# Patient Record
Sex: Female | Born: 1935 | Race: White | Hispanic: No | Smoking: Never smoker
Health system: Southern US, Community
[De-identification: ages and names within clinical notes are randomized; demographics above are authoritative.]

## PROBLEM LIST (undated history)

## (undated) DIAGNOSIS — M7580 Other shoulder lesions, unspecified shoulder: Secondary | ICD-10-CM

## (undated) DIAGNOSIS — M1990 Unspecified osteoarthritis, unspecified site: Secondary | ICD-10-CM

## (undated) DIAGNOSIS — H3530 Unspecified macular degeneration: Secondary | ICD-10-CM

## (undated) DIAGNOSIS — Z87442 Personal history of urinary calculi: Secondary | ICD-10-CM

## (undated) DIAGNOSIS — I10 Essential (primary) hypertension: Secondary | ICD-10-CM

## (undated) DIAGNOSIS — N289 Disorder of kidney and ureter, unspecified: Secondary | ICD-10-CM

## (undated) DIAGNOSIS — R42 Dizziness and giddiness: Secondary | ICD-10-CM

## (undated) DIAGNOSIS — M109 Gout, unspecified: Secondary | ICD-10-CM

## (undated) DIAGNOSIS — H539 Unspecified visual disturbance: Secondary | ICD-10-CM

## (undated) DIAGNOSIS — I4891 Unspecified atrial fibrillation: Secondary | ICD-10-CM

## (undated) DIAGNOSIS — I499 Cardiac arrhythmia, unspecified: Secondary | ICD-10-CM

## (undated) DIAGNOSIS — N390 Urinary tract infection, site not specified: Secondary | ICD-10-CM

## (undated) HISTORY — PX: CATARACT EXTRACTION: SUR2

## (undated) HISTORY — DX: Urinary tract infection, site not specified: N39.0

## (undated) HISTORY — DX: Unspecified visual disturbance: H53.9

## (undated) HISTORY — DX: Unspecified macular degeneration: H35.30

## (undated) HISTORY — DX: Dizziness and giddiness: R42

## (undated) HISTORY — PX: PARTIAL HYSTERECTOMY: SHX80

## (undated) HISTORY — DX: Unspecified osteoarthritis, unspecified site: M19.90

---

## 1999-10-11 ENCOUNTER — Other Ambulatory Visit: Admission: RE | Admit: 1999-10-11 | Discharge: 1999-10-11 | Payer: Self-pay | Admitting: *Deleted

## 1999-10-24 ENCOUNTER — Encounter: Payer: Self-pay | Admitting: Family Medicine

## 1999-10-24 ENCOUNTER — Encounter: Admission: RE | Admit: 1999-10-24 | Discharge: 1999-10-24 | Payer: Self-pay | Admitting: Family Medicine

## 1999-10-26 ENCOUNTER — Other Ambulatory Visit: Admission: RE | Admit: 1999-10-26 | Discharge: 1999-10-26 | Payer: Self-pay | Admitting: *Deleted

## 1999-11-05 ENCOUNTER — Encounter: Admission: RE | Admit: 1999-11-05 | Discharge: 1999-11-05 | Payer: Self-pay | Admitting: *Deleted

## 1999-11-05 ENCOUNTER — Encounter: Payer: Self-pay | Admitting: *Deleted

## 1999-11-21 ENCOUNTER — Inpatient Hospital Stay (HOSPITAL_COMMUNITY): Admission: RE | Admit: 1999-11-21 | Discharge: 1999-11-23 | Payer: Self-pay | Admitting: *Deleted

## 1999-11-21 ENCOUNTER — Encounter (INDEPENDENT_AMBULATORY_CARE_PROVIDER_SITE_OTHER): Payer: Self-pay

## 2000-01-14 ENCOUNTER — Encounter: Admission: RE | Admit: 2000-01-14 | Discharge: 2000-01-14 | Payer: Self-pay | Admitting: *Deleted

## 2000-01-14 ENCOUNTER — Encounter: Payer: Self-pay | Admitting: *Deleted

## 2000-10-10 ENCOUNTER — Other Ambulatory Visit: Admission: RE | Admit: 2000-10-10 | Discharge: 2000-10-10 | Payer: Self-pay | Admitting: *Deleted

## 2000-11-11 ENCOUNTER — Encounter: Admission: RE | Admit: 2000-11-11 | Discharge: 2000-11-11 | Payer: Self-pay | Admitting: *Deleted

## 2000-11-11 ENCOUNTER — Encounter: Payer: Self-pay | Admitting: *Deleted

## 2001-11-04 ENCOUNTER — Other Ambulatory Visit: Admission: RE | Admit: 2001-11-04 | Discharge: 2001-11-04 | Payer: Self-pay | Admitting: *Deleted

## 2001-11-11 ENCOUNTER — Encounter: Payer: Self-pay | Admitting: *Deleted

## 2001-11-11 ENCOUNTER — Encounter: Admission: RE | Admit: 2001-11-11 | Discharge: 2001-11-11 | Payer: Self-pay | Admitting: *Deleted

## 2001-11-13 ENCOUNTER — Ambulatory Visit (HOSPITAL_COMMUNITY): Admission: RE | Admit: 2001-11-13 | Discharge: 2001-11-13 | Payer: Self-pay | Admitting: Ophthalmology

## 2002-12-09 ENCOUNTER — Encounter: Admission: RE | Admit: 2002-12-09 | Discharge: 2002-12-09 | Payer: Self-pay | Admitting: Family Medicine

## 2002-12-09 ENCOUNTER — Encounter: Payer: Self-pay | Admitting: Family Medicine

## 2002-12-28 ENCOUNTER — Encounter: Payer: Self-pay | Admitting: Family Medicine

## 2002-12-28 ENCOUNTER — Encounter: Admission: RE | Admit: 2002-12-28 | Discharge: 2002-12-28 | Payer: Self-pay | Admitting: Family Medicine

## 2003-12-15 ENCOUNTER — Encounter: Admission: RE | Admit: 2003-12-15 | Discharge: 2003-12-15 | Payer: Self-pay | Admitting: Family Medicine

## 2004-12-28 ENCOUNTER — Encounter: Admission: RE | Admit: 2004-12-28 | Discharge: 2004-12-28 | Payer: Self-pay | Admitting: Family Medicine

## 2005-12-31 ENCOUNTER — Encounter: Admission: RE | Admit: 2005-12-31 | Discharge: 2005-12-31 | Payer: Self-pay | Admitting: Family Medicine

## 2006-01-29 ENCOUNTER — Encounter: Admission: RE | Admit: 2006-01-29 | Discharge: 2006-01-29 | Payer: Self-pay | Admitting: Family Medicine

## 2006-04-08 ENCOUNTER — Encounter: Admission: RE | Admit: 2006-04-08 | Discharge: 2006-04-08 | Payer: Self-pay | Admitting: Family Medicine

## 2007-01-15 ENCOUNTER — Encounter: Admission: RE | Admit: 2007-01-15 | Discharge: 2007-01-15 | Payer: Self-pay | Admitting: Family Medicine

## 2008-02-04 ENCOUNTER — Encounter: Admission: RE | Admit: 2008-02-04 | Discharge: 2008-02-04 | Payer: Self-pay | Admitting: Family Medicine

## 2009-02-06 ENCOUNTER — Encounter: Admission: RE | Admit: 2009-02-06 | Discharge: 2009-02-06 | Payer: Self-pay | Admitting: Family Medicine

## 2009-10-27 ENCOUNTER — Observation Stay (HOSPITAL_COMMUNITY): Admission: EM | Admit: 2009-10-27 | Discharge: 2009-10-27 | Payer: Self-pay | Admitting: Emergency Medicine

## 2010-03-09 ENCOUNTER — Encounter: Admission: RE | Admit: 2010-03-09 | Discharge: 2010-03-09 | Payer: Self-pay | Admitting: Family Medicine

## 2010-11-23 LAB — BASIC METABOLIC PANEL WITH GFR
BUN: 14 mg/dL (ref 6–23)
CO2: 28 meq/L (ref 19–32)
Chloride: 101 meq/L (ref 96–112)
Creatinine, Ser: 0.82 mg/dL (ref 0.4–1.2)
GFR calc non Af Amer: >60 mL/min (ref >60)
Glucose, Bld: 198 mg/dL — ABNORMAL HIGH (ref 70–99)

## 2010-11-23 LAB — DIFFERENTIAL
Basophils Absolute: 0 10*3/uL (ref 0.0–0.1)
Basophils Relative: 0 % (ref 0–1)
Eosinophils Absolute: 0 10*3/uL (ref 0.0–0.7)
Eosinophils Relative: 0 % (ref 0–5)
Lymphocytes Relative: 7 % — ABNORMAL LOW (ref 12–46)
Lymphs Abs: 0.8 K/uL (ref 0.7–4.0)
Monocytes Absolute: 0.6 K/uL (ref 0.1–1.0)
Monocytes Relative: 4 % (ref 3–12)
Neutro Abs: 11.2 K/uL — ABNORMAL HIGH (ref 1.7–7.7)
Neutrophils Relative %: 89 % — ABNORMAL HIGH (ref 43–77)

## 2010-11-23 LAB — URINE CULTURE: Colony Count: >=100000

## 2010-11-23 LAB — URINE MICROSCOPIC-ADD ON

## 2010-11-23 LAB — URINALYSIS, ROUTINE W REFLEX MICROSCOPIC
Bilirubin Urine: NEGATIVE
Glucose, UA: 250 mg/dL — AB
Ketones, ur: 15 mg/dL — AB
Nitrite: POSITIVE — AB
Protein, ur: NEGATIVE mg/dL
Specific Gravity, Urine: 1.021 (ref 1.005–1.030)
Urobilinogen, UA: 1 mg/dL (ref 0.0–1.0)
pH: 6.5 (ref 5.0–8.0)

## 2010-11-23 LAB — CBC
HCT: 40 % (ref 36.0–46.0)
Hemoglobin: 13.6 g/dL (ref 12.0–15.0)
MCHC: 34.1 g/dL (ref 30.0–36.0)
MCV: 95 fL (ref 78.0–100.0)
Platelets: 239 10*3/uL (ref 150–400)
RBC: 4.21 MIL/uL (ref 3.87–5.11)
RDW: 13.7 % (ref 11.5–15.5)
WBC: 12.6 K/uL — ABNORMAL HIGH (ref 4.0–10.5)

## 2010-11-23 LAB — BASIC METABOLIC PANEL
Calcium: 8.9 mg/dL (ref 8.4–10.5)
GFR calc Af Amer: >60 mL/min (ref >60)
Potassium: 3.3 mEq/L — ABNORMAL LOW (ref 3.5–5.1)
Sodium: 134 mEq/L — ABNORMAL LOW (ref 135–145)

## 2011-01-18 NOTE — H&P (Signed)
[place]  Patient:    Nancy Hayes, Nancy Hayes Visit Number: [id number] MRN: [id number]          Service Type: DSU Location: [facility] 2899 [id number] Attending Physician:  Ivor Messier Dictated by:   Guadelupe Sabin, M.D. Admit Date:  11/13/2001 Discharge Date: 11/13/2001   CC:         Kristian Covey, M.D.   History and Physical  REASON FOR ADMISSION:  This was a planned outpatient surgical admission of this 77 year old white female admitted for cataract/implant surgery of the right eye.  PRESENT ILLNESS:  This patient had been noted to have decreased vision in both eyes due to progressive cataract formation.  Patient has noted problems with blurred or smoky vision, difficulty seeing road signs, difficulty with reading, aggravation in bright sunlight, difficulty doing her work and hobbies, colors look dim and dull.  For these reasons, she elected to proceed with cataract/implant surgery of the right eye at this time.  Vision had deteriorated to 20/70.  Patient signed an informed consent and arrangements were made for her outpatient admission at this time.  PAST MEDICAL HISTORY:  Patient is in stable general health, under the care of her regular physician in [facility], Dr. Kristian Covey. Dr. Caryl Never notes the patients underlying hypertension, which is well-controlled, patient is currently taking hydrochlorothiazide, no known allergies and a low risk for the proposed surgery.  REVIEW OF SYSTEMS:  No cardiorespiratory complaints.  PHYSICAL EXAMINATION:  GENERAL:  Patient is an alert, well-nourished, well-developed 77 year old white female in no acute distress.  EYES:  Nuclear cataract formation, greater in right than left eye.  Detailed fundus examination normal, clear vitreous, attached retinae, normal optic nerves, blood vessels and maculae.  Applanation tonometry normal.  CHEST:  Lungs clear to percussion and  auscultation.  HEART:  Normal sinus rhythm.  No cardiomegaly.  No murmurs.  ABDOMEN:  Negative.  EXTREMITIES:  Negative.  ADMISSION DIAGNOSIS:  Senile cataract, both eyes.  SURGICAL PLAN:  Cataract/implant surgery, right eye now, left eye later. Dictated by:   Guadelupe Sabin, M.D. Attending Physician:  Ivor Messier DD:  11/13/01 TD:  11/14/01 Job: [id number]

## 2011-01-18 NOTE — Discharge Summary (Signed)
[facility]  Patient:    Nancy Hayes, Nancy Hayes                       MRN: [id number] Adm. Date:  [id number] Disc. Date: [id number] Attending:  Ardeen Fillers CC:         Sung Amabile. Roslyn Smiling, M.D.                           Discharge Summary  DISCHARGE DIAGNOSES:          1. Genital prolapse.                               2. Right ovarian cyst adenofibroma.                               3. Chronic hypertension.  OPERATIVE PROCEDURES:         Total vaginal hysterectomy, anterior and posterior colporrhaphy, exploratory laparotomy, and bilateral salpingo-oophorectomy, and suprapubic catheter placement on November 21, 1999.  HISTORY OF PRESENT ILLNESS:   The patient is a 77 year old woman, G1, P1, with post adventitia, which has recently become more symptomatic.  She was admitted for total vaginal hysterectomy and anterior and posterior colporrhaphy.  At the time of surgery the ovaries were evaluated.  A 5 to 6 cm right ovarian mass was noted.  The decision was made to complete the vaginal surgery and to proceed with exploratory laparotomy and bilateral salpingo-oophorectomy to evaluate the ovarian mass.  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  POSTOPERATIVE COURSE:         The patients postoperative was remarkable uncomplicated.  She required very little pain medication.  Diet was advanced without difficulty.  Hemoglobin stabilized at 10.7 and was well tolerated. She experienced a temperature of 100.4 and the remainder of her temperatures were normal.  She was discharged to home on the second postoperative day in satisfactory condition.  Routine status post exploratory laparotomy instructions were given.  She will be followed up the following week for staple removal.  She was taught to use the suprapubic catheter prior to discharge.  MEDICATIONS AT DISCHARGE:     1. Nonsteroidal anti-inflammatory medication.                 2. Hydrochlorothiazide as preoperatively.                               3. Darvocet-N 100, dispense #10.                               4. Macrobid five tablets one q.d. while the                                  catheter is in.  FOLLOWUP:                     She will be followed up on November 26, 1999, for catheter removal and evaluation of the incision. DD:  12/28/99 TD:  01/02/00 Job: [id number]

## 2011-01-18 NOTE — Op Note (Signed)
[place]  Patient:    Nancy Hayes, Nancy Hayes Visit Number: [id number] MRN: [id number]          Service Type: DSU Location: [facility] 2899 18 Attending Physician:  Ivor Messier Dictated by:   Guadelupe Sabin, M.D. Proc. Date: 11/13/01 Admit Date:  11/13/2001 Discharge Date: 11/13/2001   CC:         Kristian Covey, M.D.   Operative Report  PREOPERATIVE DIAGNOSIS:  Senile nuclear cataract, right eye.  POSTOPERATIVE DIAGNOSIS:  Senile nuclear cataract, right eye.  OPERATION:  Planned extracapsular cataract extraction -- phacoemulsification, primary insertion of posterior chamber intraocular lens implant.  SURGEON:  Guadelupe Sabin, M.D.  ASSISTANT:  Nurse.  ANESTHESIA:  Local 4% Xylocaine, 0.75% Marcaine, anesthesia standby required, patient given sodium pentothal intravenously during the period of retrobulbar blocking.  DESCRIPTION OF PROCEDURE:  After the patient was prepped and draped, a lid speculum was inserted in the right eye.  The eye was turned downward and a superior rectus traction suture placed.  Schiotz tonometry was recorded at 5 to 6 scale units with a 5.5 g weight.  A peritomy was performed adjacent to the limbus from the 11 to 1 oclock position.  The corneoscleral junction was cleaned and a corneoscleral groove made with a 45 degree Superblade.  The anterior chamber was then entered with the 2.5-mm diamond keratome at the 12 oclock position and a 15 degree blade at the 2:30 position.  Using a bent 26-gauge needle on a Healon syringe, a circular capsulorrhexis was begun and then completed with the Grabow forceps.  Hydrodissection and hydrodelineation were performed using 1% Xylocaine.  The 30 degree phacoemulsification tip was then inserted with slow, controlled emulsification of the lens nucleus, total ultrasonic time -- 1 minute 45 seconds, average power level -- 20%, total amount of fluid used -- 45 cc.   Following removal of the nucleus, the residual cortex was aspirated with the irrigation-aspiration tip.  The posterior capsule appeared intact with a brilliant red fundus reflex.  It was therefore elected to insert an [employer] SI40NB silicone 3-piece posterior chamber intraocular lens implant, diopter strength +20.00.  This was inserted with the McDonald forceps into the anterior chamber and then into the capsular bag using the [facility] lens rotator.  The lens appears to be well-centered.  The Healon which had been used throughout the procedure was then aspirated and replaced with balanced salt solution and Miochol ophthalmic solution.  The operative incisions appeared to be self-sealing and no sutures were required. Maxitrol ointment was instilled in the conjunctival cul-de-sac.  A light patch and protective shield were applied.  Duration of procedure and anesthesia administration -- 45 minutes.  Patient tolerated the procedure well in general and left the operating room for the recovery room in good condition. Dictated by:   Guadelupe Sabin, M.D. Attending Physician:  Ivor Messier DD:  11/13/01 TD:  11/14/01 Job: [id number]

## 2011-01-18 NOTE — Op Note (Signed)
[facility]  Patient:    Nancy Hayes, Nancy Hayes                       MRN: [id number] Proc. Date: 11/21/99 Adm. Date:  [id number] Attending:  Ardeen Fillers                           Operative Report  PREOPERATIVE DIAGNOSIS:       Genital prolapse.  POSTOPERATIVE DIAGNOSIS:      Genital prolapse.  Right ovarian mass.  OPERATION:                    Total vaginal hysterectomy, anterior and posterior colporrhaphy, exploratory laparotomy, bilateral salpingo-oophorectomy.  SURGEON:                      Sung Amabile. Roslyn Smiling, M.D.  ASSISTANT:                    Cordelia Pen A. Rosalio Macadamia, M.D. and Sheronette A. Cherly Hensen, M.D.  ANESTHESIA:                   General anesthesia.  ESTIMATED BLOOD LOSS:         300 cc.  TUBES AND DRAINS:             Suprapubic Foley catheter and vaginal packing.  FINDINGS:                     Third degree cystocele.  First to second degree rectocele.  Third degree uterine descensus.  5 to 6 cm irregular right ovarian mass.  Normal upper abdomen to palpation.  Normal appearing appendix. Calcified fibroid on uterus.  Normal appearing left ovary.  SPECIMENS:                    Vaginal mucosa, uterus, ovaries and tubes to pathology.  Frozen section of right ovary - serous cyst fibroma.  INDICATIONS:                  A 77 year old woman, gravida 1, para 1, many years postmenopausal with complete genital prolapse, admitted for surgery.  DESCRIPTION OF PROCEDURE:     After the establishment of general anesthesia, the patient was placed in the dorsal lithotomy position.  The perineum and lower abdomen were prepped with Betadine solution.  The bladder was evacuated with straight catheterization and the patient was draped.  Jacobsen clamps were used to grasp the cervix.  1% Xylocaine with epinephrine was used to infiltrate the cervical portio.  The cervix was circumcised with a knife.  The bladder was advanced cephalad.  The posterior  peritoneum was visualized and entered sharply. Unless otherwise noted, 0 Vicryl suture material was used.  The uterosacral ligaments were clamped, transected, and transfixed.  The uterine vessels were clamped, transected, and suture ligated.  The uterus was delivered posteriorly. A 3 cm apparently calcified fibroid was noted on the posterior fundal area.  The utero-ovarian ligament and tubes were clamped bilaterally.  These pedicles were  transected and doubly ligated.  In exploring the nature of the ovaries, it was found that the right ovary was 5 to 6 cm and was complex in character.  The decision was made to finish the anterior and posterior colporrhaphy and then proceed with exploratory laparotomy.  Foley catheter was placed to empty the bladder once more.  The  vagina anteriorly was opened from the vaginal cuff in the midline.  The mucosa was undermined and  then dissected from the endopelvic fascia.  A pursestring suture of 3-0 chromic was placed on the bladder in order to reduce it.  The second layer consisted of mattress stitches of 0 Vicryl reapproximating either side of the endopelvic fascia. Excess vaginal mucosa was excised and the anterior vagina was closed with a running interlocking suture of 3-0 chromic.  The remaining open area of the vaginal cuff was closed with a mattress suture of 0 Vicryl.  A small rectocele remained.  The perineal body was opened transversely and then a V-shaped wedge of skin was removed sharply.  The posterior vaginal mucosa was undermined and opened in the midline.  The mucosa was dissected free of the endopelvic fascia.  Deep mattress sutures in the bulbocavernosus muscles were placed.  The vaginal mucosa was closed with a running interlocking stitch of 3-0 chromic and the perineal body skin was closed with a subcuticular stitch of the  same material.  The patient was returned to the supine position.  The surgeon was  rescrubbed and the abdomen was prepped.  The patient was draped.  A low vertical incision was ade on the skin with a knife.  The incision was carried to the level of the fascia using electrocautery.  The fascia was nicked in the midline.  The fascial incision was carried superiorly and inferiorly using scissors.  Peritoneal washings were  taken with 500 cc of normal saline.  The upper abdomen was explored. Self-retaining retractor was placed and the bowel was packed carefully with moist sponges.  The bladder blade was placed.  As attention was drawn to the right adnexa, the right round ligament was isolated and transfixed.  The middle leaflet of the broad ligament was opened and the right ureters course was noted.  The infundibulopelvic ligament on that side was identified, clamped, and doubly ligated with free ties.  This was done well above the right ureter.  The right ovary and its mass were dissected free with a combination of sharp and blunt dissection. It was partially adhesed to the posterior peritoneum.  The right tube and ovary were removed intact and sent for frozen section.  Frozen section was benign. Bleeding areas in the posterior peritoneal area where the cyst had been adhesed were controlled with figure-of-eight suture of 3-0 Vicryl.  The attention was drawn to the left adnexa.  The left round ligament was identified and transfixed.  The middle leaflet of the broad ligament was opened.  The left  ureters course was identified.  The left infundibulopelvic ligament was isolated and doubly clamped well above the left ureter.  This pedicle was transected and  doubly ligated with free ties.  The left tube and ovary were then dissected free. Small bleeding area in the serosa was controlled with free tie of 3-0 Vicryl.  The bladder appeared chronically distended.  It was filled retrograde with 500 c of saline.  A pursestring suture in the bladder serosa was placed.   A #15 blade was introduced into the bladder and a #14 Foley catheter was placed easily in the bladder.  The balloon was inflated with 5 cc of saline and the Foley kept in place with a pursestring suture which was tied.  The Foley was brought out through a  simple stab incision to the right of the midline surgical incision.  Initial sponge, needle, and blade counts were correct after removal  of retractors and sponges.  The peritoneal cavity was liberally irrigated with warm normal saline. The anterior abdominal wall was closed in layers.  The fascial layer was closed  with figure-of-eight sutures of 0 Vicryl.  The subcutaneous fat was irrigated with warm normal saline.  Electrocautery was used for hemostasis.  The skin edges were infiltrated with 0.25% Marcaine.  They were then closed with staples.  The Foley catheter was fixed with 3-0 Prolene.  The urine was clear at the end of the case.  The vagina was packed with Estrace cream-soaked 1 inch gauze.  Originally placed Foley catheter was removed.  The patient was returned to the supine position, extubated without difficulty, and transported to the recovery room in satisfactory condition. DD:  11/21/99 TD:  11/22/99 Job: [id number]

## 2011-03-07 ENCOUNTER — Other Ambulatory Visit: Payer: Self-pay | Admitting: Family Medicine

## 2011-03-07 DIAGNOSIS — Z1231 Encounter for screening mammogram for malignant neoplasm of breast: Secondary | ICD-10-CM

## 2011-03-12 ENCOUNTER — Ambulatory Visit
Admission: RE | Admit: 2011-03-12 | Discharge: 2011-03-12 | Disposition: A | Discharge status: Home / Self Care | Payer: Medicare Other | Source: Ambulatory Visit | Attending: Family Medicine | Admitting: Family Medicine

## 2011-03-12 DIAGNOSIS — Z1231 Encounter for screening mammogram for malignant neoplasm of breast: Secondary | ICD-10-CM

## 2011-07-11 DIAGNOSIS — H353190 Nonexudative age-related macular degeneration, unspecified eye, stage unspecified: Secondary | ICD-10-CM | POA: Insufficient documentation

## 2011-07-11 DIAGNOSIS — Z961 Presence of intraocular lens: Secondary | ICD-10-CM | POA: Insufficient documentation

## 2012-03-02 ENCOUNTER — Other Ambulatory Visit: Payer: Self-pay | Admitting: Family Medicine

## 2012-03-02 DIAGNOSIS — Z1231 Encounter for screening mammogram for malignant neoplasm of breast: Secondary | ICD-10-CM

## 2012-03-13 ENCOUNTER — Ambulatory Visit
Admission: RE | Admit: 2012-03-13 | Discharge: 2012-03-13 | Disposition: A | Discharge status: Home / Self Care | Payer: Medicare Other | Source: Ambulatory Visit | Attending: Family Medicine | Admitting: Family Medicine

## 2012-03-13 DIAGNOSIS — Z1231 Encounter for screening mammogram for malignant neoplasm of breast: Secondary | ICD-10-CM

## 2013-02-08 ENCOUNTER — Other Ambulatory Visit: Payer: Self-pay

## 2013-02-08 DIAGNOSIS — Z1231 Encounter for screening mammogram for malignant neoplasm of breast: Secondary | ICD-10-CM

## 2013-03-04 ENCOUNTER — Ambulatory Visit: Payer: Medicare Other

## 2013-03-16 ENCOUNTER — Ambulatory Visit
Admission: RE | Admit: 2013-03-16 | Discharge: 2013-03-16 | Disposition: A | Discharge status: Home / Self Care | Payer: Medicare Other | Source: Ambulatory Visit

## 2013-03-16 DIAGNOSIS — Z1231 Encounter for screening mammogram for malignant neoplasm of breast: Secondary | ICD-10-CM

## 2013-04-13 ENCOUNTER — Other Ambulatory Visit: Payer: Self-pay | Admitting: Family Medicine

## 2013-04-13 DIAGNOSIS — M810 Age-related osteoporosis without current pathological fracture: Secondary | ICD-10-CM

## 2013-04-21 ENCOUNTER — Ambulatory Visit
Admission: RE | Admit: 2013-04-21 | Discharge: 2013-04-21 | Disposition: A | Discharge status: Home / Self Care | Payer: Medicare Other | Source: Ambulatory Visit | Attending: Family Medicine | Admitting: Family Medicine

## 2013-04-21 DIAGNOSIS — M810 Age-related osteoporosis without current pathological fracture: Secondary | ICD-10-CM

## 2014-10-14 ENCOUNTER — Emergency Department (HOSPITAL_COMMUNITY): Payer: Medicare Other

## 2014-10-14 ENCOUNTER — Emergency Department (HOSPITAL_COMMUNITY)
Admission: EM | Admit: 2014-10-14 | Discharge: 2014-10-14 | Disposition: A | Discharge status: Home / Self Care | Payer: Medicare Other | Attending: Emergency Medicine | Admitting: Emergency Medicine

## 2014-10-14 ENCOUNTER — Encounter (HOSPITAL_COMMUNITY): Payer: Self-pay | Admitting: *Deleted

## 2014-10-14 DIAGNOSIS — Z79899 Other long term (current) drug therapy: Secondary | ICD-10-CM | POA: Diagnosis not present

## 2014-10-14 DIAGNOSIS — R42 Dizziness and giddiness: Secondary | ICD-10-CM | POA: Diagnosis not present

## 2014-10-14 DIAGNOSIS — Z7982 Long term (current) use of aspirin: Secondary | ICD-10-CM | POA: Diagnosis not present

## 2014-10-14 DIAGNOSIS — I10 Essential (primary) hypertension: Secondary | ICD-10-CM | POA: Insufficient documentation

## 2014-10-14 DIAGNOSIS — Z8739 Personal history of other diseases of the musculoskeletal system and connective tissue: Secondary | ICD-10-CM | POA: Insufficient documentation

## 2014-10-14 DIAGNOSIS — Z87448 Personal history of other diseases of urinary system: Secondary | ICD-10-CM | POA: Insufficient documentation

## 2014-10-14 DIAGNOSIS — I499 Cardiac arrhythmia, unspecified: Secondary | ICD-10-CM | POA: Diagnosis not present

## 2014-10-14 HISTORY — DX: Essential (primary) hypertension: I10

## 2014-10-14 HISTORY — DX: Disorder of kidney and ureter, unspecified: N28.9

## 2014-10-14 HISTORY — DX: Gout, unspecified: M10.9

## 2014-10-14 LAB — BASIC METABOLIC PANEL
ANION GAP: 10 (ref 5–15)
BUN: 16 mg/dL (ref 6–23)
CALCIUM: 10.4 mg/dL (ref 8.4–10.5)
CO2: 23 mmol/L (ref 19–32)
Chloride: 104 mmol/L (ref 96–112)
Creatinine, Ser: 0.74 mg/dL (ref 0.50–1.10)
GFR, EST AFRICAN AMERICAN: 87 mL/min — AB (ref >90)
GFR, EST NON AFRICAN AMERICAN: 75 mL/min — AB (ref >90)
GLUCOSE: 120 mg/dL — AB (ref 70–99)
Potassium: 3.5 mmol/L (ref 3.5–5.1)
Sodium: 137 mmol/L (ref 135–145)

## 2014-10-14 LAB — TROPONIN I

## 2014-10-14 LAB — CBC
HEMATOCRIT: 42.3 % (ref 36.0–46.0)
Hemoglobin: 14.3 g/dL (ref 12.0–15.0)
MCH: 31.5 pg (ref 26.0–34.0)
MCHC: 33.8 g/dL (ref 30.0–36.0)
MCV: 93.2 fL (ref 78.0–100.0)
PLATELETS: 252 10*3/uL (ref 150–400)
RBC: 4.54 MIL/uL (ref 3.87–5.11)
RDW: 13.8 % (ref 11.5–15.5)
WBC: 8.6 10*3/uL (ref 4.0–10.5)

## 2014-10-14 MED ORDER — SODIUM CHLORIDE 0.9 % IV BOLUS (SEPSIS)
500.0000 mL | Freq: Once | INTRAVENOUS | Status: AC
  Administered 2014-10-14: 500 mL via INTRAVENOUS

## 2014-10-14 MED ORDER — MECLIZINE HCL 25 MG PO TABS: 25.0000 mg | ORAL_TABLET | Freq: Three times a day (TID) | ORAL | Status: DC | PRN

## 2014-10-14 MED ORDER — MECLIZINE HCL 25 MG PO TABS
25.0000 mg | ORAL_TABLET | Freq: Once | ORAL | Status: AC
  Administered 2014-10-14: 25 mg via ORAL
  Filled 2014-10-14: qty 1

## 2014-10-14 NOTE — ED Notes (Signed)
Family at bedside. 

## 2014-10-14 NOTE — ED Notes (Signed)
Pt has history of dysphagia. Notified MD. Pt able to swallow pills at home fine. Pt tolerated well.

## 2014-10-14 NOTE — ED Notes (Signed)
Patient is alert and orientedx4.  Patient was explained discharge instructions and they understood them with no questions.  The patient's son, Julionna Marczak is taking the patient home.

## 2014-10-14 NOTE — Discharge Instructions (Signed)
Benign Positional Vertigo Vertigo means you feel like you or your surroundings are moving when they are not. Benign positional vertigo is the most common form of vertigo. Benign means that the cause of your condition is not serious. Benign positional vertigo is more common in older adults. CAUSES  Benign positional vertigo is the result of an upset in the labyrinth system. This is an area in the middle ear that helps control your balance. This may be caused by a viral infection, head injury, or repetitive motion. However, often no specific cause is found. SYMPTOMS  Symptoms of benign positional vertigo occur when you move your head or eyes in different directions. Some of the symptoms may include:  Loss of balance and falls.  Vomiting.  Blurred vision.  Dizziness.  Nausea.  Involuntary eye movements (nystagmus). DIAGNOSIS  Benign positional vertigo is usually diagnosed by physical exam. If the specific cause of your benign positional vertigo is unknown, your caregiver may perform imaging tests, such as magnetic resonance imaging (MRI) or computed tomography (CT). TREATMENT  Your caregiver may recommend movements or procedures to correct the benign positional vertigo. Medicines such as meclizine, benzodiazepines, and medicines for nausea may be used to treat your symptoms. In rare cases, if your symptoms are caused by certain conditions that affect the inner ear, you may need surgery. HOME CARE INSTRUCTIONS   Follow your caregiver's instructions.  Move slowly. Do not make sudden body or head movements.  Avoid driving.  Avoid operating heavy machinery.  Avoid performing any tasks that would be dangerous to you or others during a vertigo episode.  Drink enough fluids to keep your urine clear or pale yellow. SEEK IMMEDIATE MEDICAL CARE IF:   You develop problems with walking, weakness, numbness, or using your arms, hands, or legs.  You have difficulty speaking.  You develop  severe headaches.  Your nausea or vomiting continues or gets worse.  You develop visual changes.  Your family or friends notice any behavioral changes.  Your condition gets worse.  You have a fever.  You develop a stiff neck or sensitivity to light. MAKE SURE YOU:   Understand these instructions.  Will watch your condition.  Will get help right away if you are not doing well or get worse. Document Released: 05/27/2006 Document Revised: 11/11/2011 Document Reviewed: 05/09/2011 Exit[employer] Patient Information 2015 [employer]. This information is not intended to replace advice given to you by your health care provider. Make sure you discuss any questions you have with your health care provider.    

## 2014-10-14 NOTE — ED Provider Notes (Signed)
CSN: [id number]     Arrival date & time 10/14/14  1607 History   First MD Initiated Contact with Patient 10/14/14 1839     Chief Complaint  Patient presents with  . Hypertension     HPI  Patient  presents for evaluation of dizziness. She that  when she was up around or in the day she would feel occasionally like he was removing her should hold on for balance. This was intermittent until a few hours ago and became more persistent. He states occasionally she will feel her heart "quivering". This is been going on for weeks or months and is not new today. She did not describe tachycardia palpitations. She is not orthostatic or symptomatic only when up and around.  Chest pain. She denies nausea vomiting. No shortness of breath.  Past Medical History  Diagnosis Date  . Hypertension   . Gout   . Renal disorder    History reviewed. No pertinent past surgical history. No family history on file. History  Substance Use Topics  . Smoking status: Never Smoker   . Smokeless tobacco: Not on file  . Alcohol Use: No   OB History    No data available     Review of Systems  Constitutional: Negative for fever, chills, diaphoresis, appetite change and fatigue.  HENT: Negative for mouth sores, sore throat and trouble swallowing.   Eyes: Negative for visual disturbance.  Respiratory: Negative for cough, chest tightness, shortness of breath and wheezing.   Cardiovascular: Negative for chest pain.       Irregular heartbeat  Gastrointestinal: Negative for nausea, vomiting, abdominal pain, diarrhea and abdominal distention.  Endocrine: Negative for polydipsia, polyphagia and polyuria.  Genitourinary: Negative for dysuria, frequency and hematuria.  Musculoskeletal: Negative for gait problem.  Skin: Negative for color change, pallor and rash.  Neurological: Positive for dizziness. Negative for syncope, light-headedness and headaches.  Hematological: Does not bruise/bleed easily.   Psychiatric/Behavioral: Negative for behavioral problems and confusion.      Allergies  Review of patient's allergies indicates no known allergies.  Home Medications   Prior to Admission medications   Medication Sig Start Date End Date Taking? Authorizing Provider  aspirin EC 81 MG tablet Take 81 mg by mouth daily.   Yes Historical Provider, MD  CALCIUM PO Take 1 tablet by mouth daily.   Yes Historical Provider, MD  Multiple Vitamins-Minerals (PRESERVISION AREDS PO) Take 1 capsule by mouth daily.   Yes Historical Provider, MD  nebivolol (BYSTOLIC) 5 MG tablet Take 5 mg by mouth daily.   Yes Historical Provider, MD  Omega-3 Fatty Acids (FISH OIL PO) Take 1 capsule by mouth daily.   Yes Historical Provider, MD  meclizine (ANTIVERT) 25 MG tablet Take 1 tablet (25 mg total) by mouth 3 (three) times daily as needed for dizziness. 10/14/14   Tanna Furry, MD   BP 175/78 mmHg  Pulse 65  Temp(Src) 98 F (36.7 C) (Oral)  Resp 18  SpO2 97% Physical Exam  Constitutional: She is oriented to person, place, and time. She appears well-developed and well-nourished. No distress.  HENT:  Head: Normocephalic.  Eyes: Conjunctivae are normal. Pupils are equal, round, and reactive to light. No scleral icterus.  Neck: Normal range of motion. Neck supple. No thyromegaly present.  Cardiovascular: Normal rate.  An irregular rhythm present. Exam reveals no gallop and no friction rub.   No murmur heard. Heart has a regular irregularity. Atrial trigeminy on the monitor and EKG noted.  Pulmonary/Chest: Effort  normal and breath sounds normal. No respiratory distress. She has no wheezes. She has no rales.  Abdominal: Soft. Bowel sounds are normal. She exhibits no distension. There is no tenderness. There is no rebound.  Musculoskeletal: Normal range of motion.  Neurological: She is alert and oriented to person, place, and time.  Complains of feeling of movement and grabs the bed rails when her head is moved  left right or looking upwards. Normal strength and sensation fortunately sprayed normal cranial nerves with the exception of the vertigo.  Skin: Skin is warm and dry. No rash noted.  Psychiatric: She has a normal mood and affect. Her behavior is normal.    ED Course  Procedures (including critical care time) Labs Review Labs Reviewed  BASIC METABOLIC PANEL - Abnormal; Notable for the following:    Glucose, Bld 120 (*)    GFR calc non Af Amer 75 (*)    GFR calc Af Amer 87 (*)    All other components within normal limits  CBC  TROPONIN I    Imaging Review Dg Chest 2 View  10/14/2014   CLINICAL DATA:  High blood pressure. Decreased heart rate. Dizziness, nausea. History of hypertension.  EXAM: CHEST  2 VIEW  COMPARISON:  06/30/2008  FINDINGS: Heart is enlarged. Aorta is tortuous and calcified. Lungs are hyperinflated. There is bibasilar atelectasis. No focal consolidations or definite pleural effusions are identified. No pulmonary edema.  IMPRESSION: 1. Cardiomegaly.  No edema. 2. Bibasilar atelectasis.   Electronically Signed   By: Nolon Nations M.D.   On: 10/14/2014 17:49   Ct Head Wo Contrast  10/14/2014   CLINICAL DATA:  Vertigo.  Hypertension.  EXAM: CT HEAD WITHOUT CONTRAST  TECHNIQUE: Contiguous axial images were obtained from the base of the skull through the vertex without intravenous contrast.  COMPARISON:  None.  FINDINGS: There is no evidence of intracranial hemorrhage, brain edema, or other signs of acute infarction. There is no evidence of intracranial mass lesion or mass effect. No abnormal extraaxial fluid collections are identified.  Mild chronic small vessel disease is noted. Ventricles are normal in size. No skull abnormality identified.  IMPRESSION: No acute intracranial findings.  Mild chronic small vessel disease.   Electronically Signed   By: Earle Gell M.D.   On: 10/14/2014 20:40     EKG Interpretation   Date/Time:  Friday October 14 2014 16:15:30  EST Ventricular Rate:  84 PR Interval:  174 QRS Duration: 84 QT Interval:  362 QTC Calculation: 427 R Axis:   -9 Text Interpretation:  Sinus rhythm with Premature supraventricular  complexes Atrial Trigeminy  Possible Left atrial enlargement Nonspecific  ST and T wave abnormality Abnormal ECG Confirmed by Jeneen Rinks  MD, [place]  [phone]) on 10/14/2014 9:43:35 PM      MDM   Final diagnoses:  Vertigo    Since EKG shows atrial trigeminy. She states that she occasionally is aware of irregularity of the heart. This is not new today. Her symptoms are vertigo or feeling of movement. Not syncope or lightheadedness. Anxious appropriate for outpatient treatment. She is accompanied by family. . She is actually standing at the door of the room anxious to be discharged.    Tanna Furry, MD 10/14/14 2145

## 2014-10-14 NOTE — ED Notes (Signed)
Patient transported to CT 

## 2014-10-14 NOTE — ED Notes (Signed)
The pt is c/o her heart quivering today.  She was seen at an urgent care in [place] and sent here.  Their ekg machine was broken.  Alert no pain

## 2015-04-18 ENCOUNTER — Telehealth: Payer: Self-pay | Admitting: Cardiovascular Disease

## 2015-04-18 NOTE — Telephone Encounter (Signed)
Received records from [facility] for appointment with Dr Sallyanne Kuster 06/27/14.  Records given to [facility] (medical records) for Dr Croitoru's schedule on 06/28/15. lp

## 2015-05-10 ENCOUNTER — Other Ambulatory Visit: Payer: Self-pay | Admitting: Family Medicine

## 2015-05-10 DIAGNOSIS — R200 Anesthesia of skin: Secondary | ICD-10-CM

## 2015-05-10 DIAGNOSIS — M542 Cervicalgia: Secondary | ICD-10-CM

## 2015-05-23 ENCOUNTER — Ambulatory Visit
Admission: RE | Admit: 2015-05-23 | Discharge: 2015-05-23 | Disposition: A | Discharge status: Home / Self Care | Payer: Medicare Other | Source: Ambulatory Visit | Attending: Family Medicine | Admitting: Family Medicine

## 2015-05-23 DIAGNOSIS — M542 Cervicalgia: Secondary | ICD-10-CM

## 2015-05-23 DIAGNOSIS — R200 Anesthesia of skin: Secondary | ICD-10-CM

## 2015-06-02 ENCOUNTER — Other Ambulatory Visit: Payer: Self-pay | Admitting: Neurological Surgery

## 2015-06-02 DIAGNOSIS — M4712 Other spondylosis with myelopathy, cervical region: Secondary | ICD-10-CM

## 2015-06-13 ENCOUNTER — Ambulatory Visit
Admission: RE | Admit: 2015-06-13 | Discharge: 2015-06-13 | Disposition: A | Discharge status: Home / Self Care | Payer: Medicare Other | Source: Ambulatory Visit | Attending: Neurological Surgery | Admitting: Neurological Surgery

## 2015-06-13 ENCOUNTER — Other Ambulatory Visit: Payer: Medicare Other

## 2015-06-13 DIAGNOSIS — M4712 Other spondylosis with myelopathy, cervical region: Secondary | ICD-10-CM

## 2015-06-28 ENCOUNTER — Ambulatory Visit: Payer: Medicare Other | Admitting: Cardiovascular Disease

## 2015-06-29 ENCOUNTER — Encounter: Payer: Self-pay | Admitting: Cardiovascular Disease

## 2015-06-29 ENCOUNTER — Ambulatory Visit (INDEPENDENT_AMBULATORY_CARE_PROVIDER_SITE_OTHER): Payer: Medicare Other | Admitting: Cardiovascular Disease

## 2015-06-29 VITALS — BP 182/102 | HR 87 | Ht 64.0 in | Wt 137.0 lb

## 2015-06-29 DIAGNOSIS — I491 Atrial premature depolarization: Secondary | ICD-10-CM | POA: Diagnosis not present

## 2015-06-29 NOTE — Progress Notes (Signed)
Patient ID: Nancy Hayes, female   DOB: Jan 27, 1927, 81 y.o.   MRN: [id number]     Cardiology Office Note   Date:  06/30/2015   ID:  Nancy Hayes, DOB Feb 10, 1927, MRN [id number]  PCP:  Nancy Hayes  Cardiologist:   Nancy Klein, MD   Chief Complaint  Patient presents with  . New Patient (Initial Visit)  . Hypertension  . Irregular Heart Beat  . Dizziness      History of Present Illness: Nancy Hayes is a 81 y.o. female who presents for  Irregular heart rhythm. While this is the first time that I am seeing her as her physician, I have met Nancy Hayes many times when she accompanies her husband Nancy Hayes to our office appointments.    Nancy Hayes has a history of systemic hypertension there has generally been well controlled. She does not have known structural heart disease. During a recent office evaluation her heart rhythm was very irregular and she was found to be in sustained atrial trigeminy. She is not really aware of the arrhythmia other than occasional palpitations. She denies issues with dizziness or syncope and does not have exertional complaints of angina or dyspnea. She denies lower extremity edema. There is no history of atrial fibrillation or of any significant structural heart disease.  Significant noncardiac illnesses include history of diverticulosis, gastroesophageal reflux disease, gout, macular degeneration, degenerative arthritis and osteoporosis.   at one point there was concern that she had bradycardia with a heart rate of 49 bpm. Her heart rate was measured by peripheral pulse palpation, not by auscultation or electrocardiographic recording.  The presence of bradycardia lead to concerns regarding increasing the beta blocker dose. She did not have symptoms of bradycardia.  Past Medical History  Diagnosis Date  . Hypertension   . Gout   . Renal disorder     Past surgical history:  Hysterectomy , subsequent bilateral nephrectomy , cataract  surgery.   Current Outpatient Prescriptions  Medication Sig Dispense Refill  . aspirin EC 81 MG tablet Take 81 mg by mouth daily.    Marland Kitchen CALCIUM PO Take 1,200 mg by mouth daily.     . hydrochlorothiazide (HYDRODIURIL) 25 MG tablet Take 25 mg by mouth daily.    . Multiple Vitamins-Minerals (PRESERVISION AREDS PO) Take 1 capsule by mouth daily.    . Omega-3 Fatty Acids (FISH OIL PO) Take 1 capsule by mouth daily.    . metoprolol succinate (TOPROL-XL) 50 MG 24 hr tablet Take 1 tablet (50 mg total) by mouth daily. Take with or immediately following a meal. 90 tablet 3   No current facility-administered medications for this visit.    Allergies:   Review of patient's allergies indicates no known allergies.    Social History:  The patient  reports that she has never smoked. She does not have any smokeless tobacco history on file. She reports that she does not drink alcohol.   Family History:  The patient's family history significant for hypertension, Alzheimer's disease and poorly defined "heart disease" albeit not at a young age.   ROS:  Please see the history of present illness.    Otherwise, review of systems positive for  Occasional fatigue.   All other systems are reviewed and negative.    PHYSICAL EXAM: VS:  BP 182/102 mmHg  Pulse 87  Ht '5\' 4"'  (1.626 m)  Wt 137 lb (62.143 kg)  BMI 23.50 kg/m2 , BMI Body mass index is 23.5 kg/(m^2).  General: Alert, oriented  x3, no distress Head: no evidence of trauma, PERRL, EOMI, no exophtalmos or lid lag, no myxedema, no xanthelasma; normal ears, nose and oropharynx Neck: normal jugular venous pulsations and no hepatojugular reflux; brisk carotid pulses without delay and no carotid bruits Chest: clear to auscultation, no signs of consolidation by percussion or palpation, normal fremitus, symmetrical and full respiratory excursions Cardiovascular: normal position and quality of the apical impulse, regularly  Irregular trigeminal rhythm, normal  first and second heart sounds, no murmurs, rubs or gallops Abdomen: no tenderness or distention, no masses by palpation, no abnormal pulsatility or arterial bruits, normal bowel sounds, no hepatosplenomegaly Extremities: no clubbing, cyanosis or edema; 2+ radial, ulnar and brachial pulses bilaterally; 2+ right femoral, posterior tibial and dorsalis pedis pulses; 2+ left femoral, posterior tibial and dorsalis pedis pulses; no subclavian or femoral bruits Neurological: grossly nonfocal Psych: euthymic mood, full affect   EKG:  EKG is ordered today. The ekg ordered today demonstrates  Sinus rhythm with atrial trigeminy , otherwise normal   Recent Labs: 10/14/2014: BUN 16; Creatinine, Ser 0.74; Hemoglobin 14.3; Platelets 252; Potassium 3.5; Sodium 137    labs in January show TSH 3.63 Lipid Panel No results found for: CHOL, TRIG, HDL, CHOLHDL, VLDL, LDLCALC, LDLDIRECT    Wt Readings from Last 3 Encounters:  06/29/15 137 lb (62.143 kg)       ASSESSMENT AND PLAN:   Nancy Hayes has essentially asymptomatic premature atrial contractions. These are very frequent and commonly occur in a pattern of trigeminy. The premature beats do not always produce a palpable pulse which may lead to artifactual bradycardia, especially for heart rate is estimated using an automatic blood pressure cuff which could easily "under count" the premature beats. Her physical exam and history do not suggest the presence of significant structural heart disease and she does not have symptoms of bradycardia.   She prefers very conservative management. She does not have a history of atrial fibrillation , nor does she have significant risk factors for this disorder other than her advanced age.  I have recommended that we increase the metoprolol succinate to 50 mg daily and reevaluate her in about 3 months.    Current medicines are reviewed at length with the patient today.  The patient does not have concerns regarding  medicines.  The following changes have been made:  no change  Labs/ tests ordered today include:   Orders Placed This Encounter  Procedures  . EKG 12-Lead    Patient Instructions  Your physician has recommended you make the following change in your medication: INCREASE METOPROLOL ER TO 50MG DAILY  Dr. Sallyanne Kuster recommends that you schedule a follow-up appointment in: [place], Mase Dhondt, MD  06/30/2015 5:51 PM    Nancy Klein, MD, [facility] [phone] office [phone] pager

## 2015-06-29 NOTE — Patient Instructions (Signed)
Your physician has recommended you make the following change in your medication: INCREASE METOPROLOL ER TO 50MG  DAILY  Dr. Sallyanne Kuster recommends that you schedule a follow-up appointment in: 3 MONTHS

## 2015-06-30 ENCOUNTER — Encounter: Payer: Self-pay | Admitting: Cardiovascular Disease

## 2015-06-30 MED ORDER — METOPROLOL SUCCINATE ER 50 MG PO TB24: 50.0000 mg | ORAL_TABLET | Freq: Every day | ORAL | Status: DC

## 2015-08-26 IMAGING — CR DG CHEST 2V
2 series · 2 of 2 positions shown · non-contrast
Comparison: 06/30/2008

CLINICAL DATA: High blood pressure. Decreased heart rate.
Dizziness, nausea. History of hypertension.

EXAM:
CHEST  2 VIEW

[chest pa]
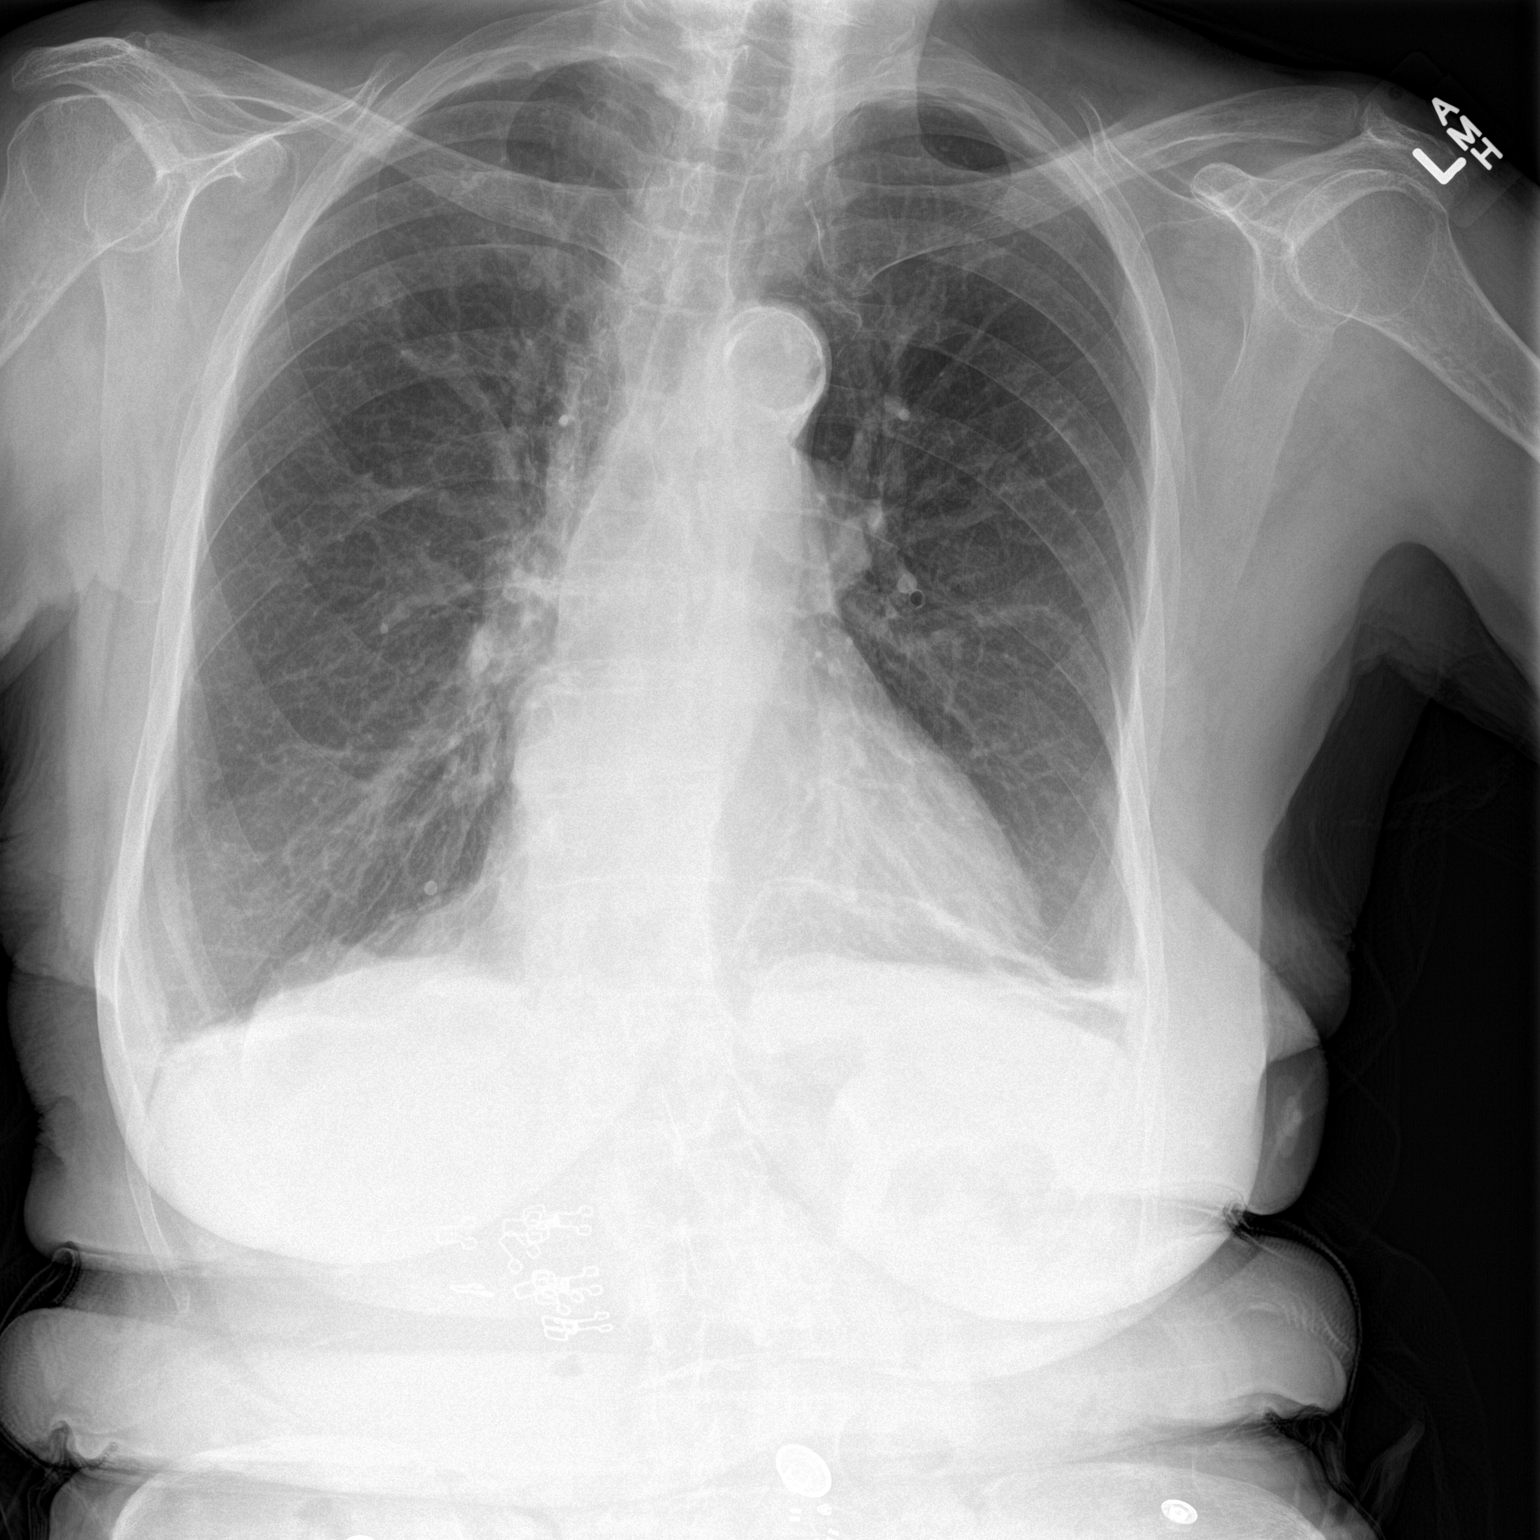

[chest lat]
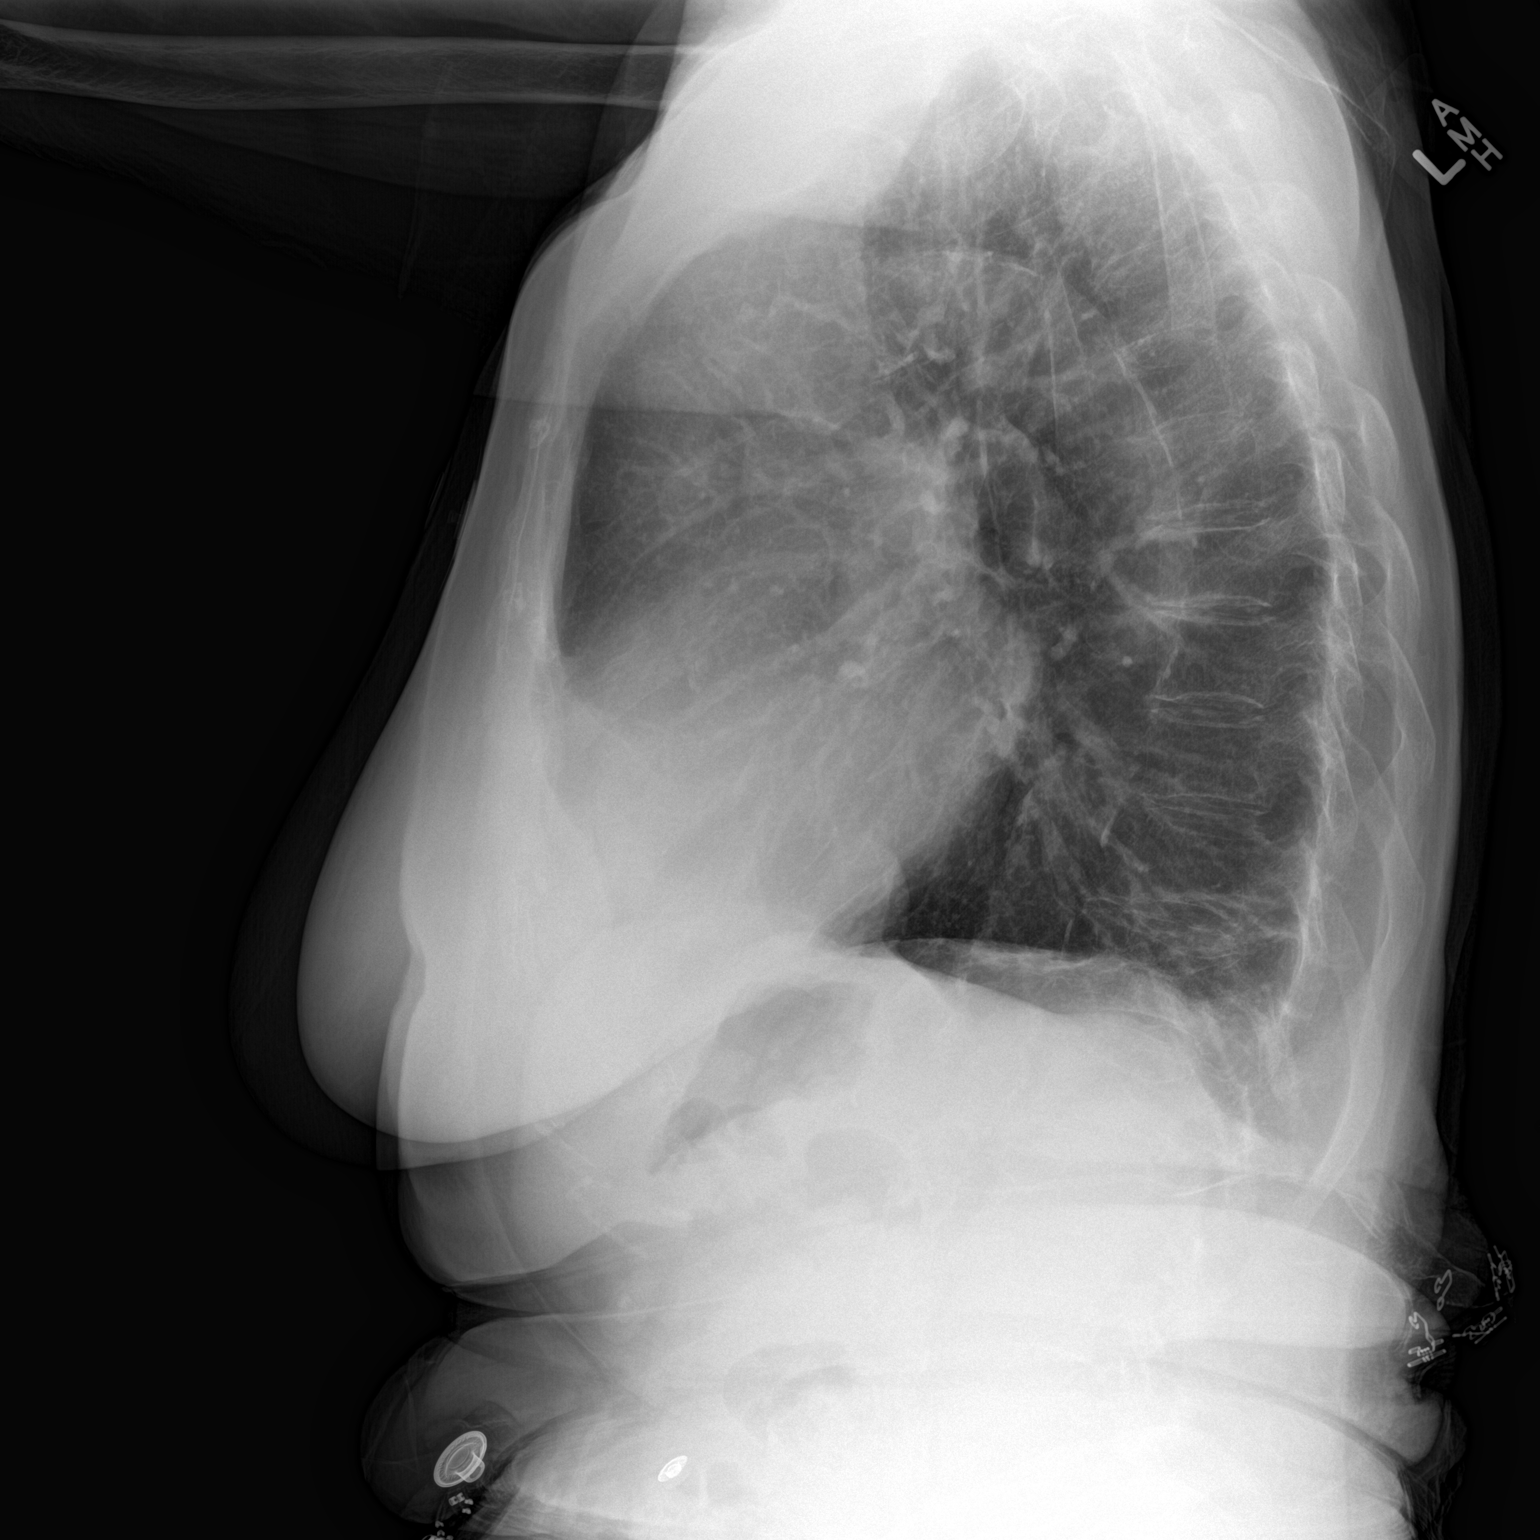

[2 of 2 positions shown; findings below may reference images not displayed]

FINDINGS: Heart is enlarged. Aorta is tortuous and calcified. Lungs are
hyperinflated. There is bibasilar atelectasis. No focal
consolidations or definite pleural effusions are identified. No
pulmonary edema.
IMPRESSION: 1. Cardiomegaly.  No edema.
2. Bibasilar atelectasis.

## 2015-10-12 ENCOUNTER — Ambulatory Visit: Payer: Medicare Other | Admitting: Cardiovascular Disease

## 2015-11-23 ENCOUNTER — Ambulatory Visit (INDEPENDENT_AMBULATORY_CARE_PROVIDER_SITE_OTHER): Payer: Medicare Other | Admitting: Cardiovascular Disease

## 2015-11-23 ENCOUNTER — Encounter: Payer: Self-pay | Admitting: Cardiovascular Disease

## 2015-11-23 VITALS — BP 133/82 | HR 61 | Ht 64.0 in | Wt 136.0 lb

## 2015-11-23 DIAGNOSIS — I491 Atrial premature depolarization: Secondary | ICD-10-CM

## 2015-11-23 DIAGNOSIS — I10 Essential (primary) hypertension: Secondary | ICD-10-CM

## 2015-11-23 NOTE — Progress Notes (Signed)
Patient ID: LELLA SARIC, female   DOB: [birth year], 82 y.o.   MRN: [id number]    Cardiology Office Note    Date:  11/23/2015   ID:  ALEJAH DEMARCHI, DOB [birth year], MRN [id number]  PCP:  Tula Nakayama  Cardiologist:   Sanda Klein, MD   Chief Complaint  Patient presents with  . 3 months    History of Present Illness:  Toni ALLYSSIA SISTO is a 82 y.o. female with mild systemic hypertension and mildly symptomatic premature atrial contractions who returns in follow-up.At her last appointment we stopped her diuretic and double her dose of beta blocker and her blood pressure seems to be well controlled on the current regimen. She is really troubled by the palpitations. She continues to note that her heart rate is regular if she uses her home automatic blood pressure cuff. She generally feels quite well. He has occasional dizziness which she describes as "inner here". Indeed, she becomes dizzy sometimes just rolling over in bed. The dizziness is not associated with standing.She is accompanied today by her husband, [name].  Electrocautery gram showed a single PAC on the 10 second tracing, but on exam she seems to frequently be in trigeminal rhythm.  Past Medical History  Diagnosis Date  . Hypertension   . Gout   . Renal disorder     Past Surgical History  Procedure Laterality Date  . Partial hysterectomy      Outpatient Prescriptions Prior to Visit  Medication Sig Dispense Refill  . aspirin EC 81 MG tablet Take 81 mg by mouth daily.    Marland Kitchen CALCIUM PO Take 1,200 mg by mouth daily.     . metoprolol succinate (TOPROL-XL) 50 MG 24 hr tablet Take 1 tablet (50 mg total) by mouth daily. Take with or immediately following a meal. 90 tablet 3  . Multiple Vitamins-Minerals (PRESERVISION AREDS PO) Take 1 capsule by mouth daily.    . Omega-3 Fatty Acids (FISH OIL PO) Take 1 capsule by mouth daily.    . hydrochlorothiazide (HYDRODIURIL) 25 MG tablet Take 25 mg by mouth daily.     No  facility-administered medications prior to visit.     Allergies:   Review of patient's allergies indicates no known allergies.   Social History   Social History  . Marital Status: Married    Spouse Name: N/A  . Number of Children: N/A  . Years of Education: N/A   Social History Main Topics  . Smoking status: Never Smoker   . Smokeless tobacco: None  . Alcohol Use: No  . Drug Use: None  . Sexual Activity: Not Asked   Other Topics Concern  . None   Social History Narrative     Family History:  The patient's family history includes Hypertension in her mother.   ROS:   Please see the history of present illness.    ROS All other systems reviewed and are negative.   PHYSICAL EXAM:   VS:  BP 133/82 mmHg  Pulse 61  Ht 5\' 4"  (1.626 m)  Wt 61.689 kg (136 lb)  BMI 23.33 kg/m2   GEN: Well nourished, well developed, in no acute distress HEENT: normal Neck: no JVD, carotid bruits, or masses Cardiac: RRR baseline with frequent ectopic beats; no murmurs, rubs, or gallops,no edema  Respiratory:  clear to auscultation bilaterally, normal work of breathing GI: soft, nontender, nondistended, + BS MS: no deformity or atrophy Skin: warm and dry, no rash Neuro:  Alert and Oriented x 3, Strength and  sensation are intact Psych: euthymic mood, full affect  Wt Readings from Last 3 Encounters:  11/23/15 61.689 kg (136 lb)  06/29/15 62.143 kg (137 lb)      Studies/Labs Reviewed:   EKG:  EKG is ordered today.  The ekg ordered today demonstrates inus rhythm with a single PAC, left ventricular hypertrophy changes   ASSESSMENT:    1. PACs (premature atrial contraction)  with trigeminy   2. Essential hypertension      PLAN:  In order of problems listed above:  1. Mrs. Potvin has occasional episodes of very frequent premature atrial contractions, often in a pattern of trigeminy, with artifact to undersensing premature beats by pulse oximeter or automatic blood pressure cuff.  Paradoxically, her pulse rate is "faster" on a higher dose of beta blocker since she now has fewer undersensed PACs. 2. The current dose of beta blocker also seems to be doing a good job of controlling her hypertension, after stopping the diuretic. Recommend continuing the current therapy.    Medication Adjustments/Labs and Tests Ordered: Current medicines are reviewed at length with the patient today.  Concerns regarding medicines are outlined above.  Medication changes, Labs and Tests ordered today are listed in the Patient Instructions below. Patient Instructions  Your physician recommends that you continue on your current medications as directed. Please refer to the Current Medication list given to you today.  Dr Sallyanne Kuster recommends that you schedule a follow-up appointment in 1 year. You will receive a reminder letter in the mail two months in advance. If you don't receive a letter, please call our office to schedule the follow-up appointment.  If you need a refill on your cardiac medications before your next appointment, please call your pharmacy.        Mikael Spray, MD  11/23/2015 1:21 PM    [facility] [address], [place], [place]  [address] Phone: [phone]; Fax: [phone]

## 2015-11-23 NOTE — Patient Instructions (Addendum)
Your physician recommends that you continue on your current medications as directed. Please refer to the Current Medication list given to you today.  Dr Croitoru recommends that you schedule a follow-up appointment in 1 year. You will receive a reminder letter in the mail two months in advance. If you don't receive a letter, please call our office to schedule the follow-up appointment.  If you need a refill on your cardiac medications before your next appointment, please call your pharmacy. 

## 2016-05-14 ENCOUNTER — Ambulatory Visit (INDEPENDENT_AMBULATORY_CARE_PROVIDER_SITE_OTHER): Payer: Medicare Other | Admitting: Neurology

## 2016-05-14 ENCOUNTER — Encounter: Payer: Self-pay | Admitting: Neurology

## 2016-05-14 VITALS — BP 177/75 | HR 70 | Ht 64.0 in | Wt 137.0 lb

## 2016-05-14 DIAGNOSIS — R42 Dizziness and giddiness: Secondary | ICD-10-CM

## 2016-05-14 DIAGNOSIS — H8110 Benign paroxysmal vertigo, unspecified ear: Secondary | ICD-10-CM | POA: Diagnosis not present

## 2016-05-14 DIAGNOSIS — G450 Vertebro-basilar artery syndrome: Secondary | ICD-10-CM

## 2016-05-14 NOTE — Progress Notes (Signed)
[facility] [address] [place]. [place] [address] [phone]       OFFICE CONSULT NOTE  [name] Date of Birth:  Jan 03, 1927 Medical Record Number:  [id number]   Referring MD:  Francisco Capuchin, PA-C  Reason for Referral:  Vertigo  HPI: [name] is a 82 year old Caucasian lady who is accompanied today by her husband. She has had intermittent dizziness off and on for at least 30+ years. She describes this had transient noticed on in certain positions when she turns her head suddenly to the right or looks up or down. She describes this as a feeling of vertigo and room spinning. This is transient and resolves when she corrects her head position. This is not accompanied by nausea vomiting, blurred vision or loss of vision. She denies any complaint diplopia, numbness or slurred speech. She has been seen by primary care physician over the years and treated with meclizine but short-term relief. She has never seen an ENT specialist or neurologist for these symptoms. Most recently she was started on meclizine which seems to be helping. She has not been asked to do vestibular stabilization exercises. She denies any significant loss of hearing, tinnitus, prior history of ear pain, infection or injury. Is no known prior history of strokes TIAs or significant neurological problems. She had a CT scan of the head done a year ago which I have reviewed shows mild age-related changes of atrophy but no acute abnormality. She's not had any recent brain imaging studies done. These episodes of dizziness seemed to be more prominent during the summer months and more likely when she is looking up or down. She does feel off balance and wobbles for a few days after these episodes. She does have a known history of cervical spinal stenosis at C2 and has been seen by neurosurgeon Dr. Cyndy Freeze but was not felt to be a surgical candidate due to her advanced age. ROS:   14 system review of systems is  positive for vertigo, dizziness, numbness, and gait imbalance, hearing loss and all other systems negative  PMH:  Past Medical History:  Diagnosis Date  . Arthritis   . Dizzy spells   . Gout   . Hypertension   . Macular degeneration of both eyes   . Renal disorder   . UTI (lower urinary tract infection)   . Vertigo   . Vision abnormalities     Social History:  Social History   Social History  . Marital status: Married    Spouse name: N/A  . Number of children: N/A  . Years of education: N/A   Occupational History  . Not on file.   Social History Main Topics  . Smoking status: Never Smoker  . Smokeless tobacco: Never Used  . Alcohol use No  . Drug use: No  . Sexual activity: Not on file   Other Topics Concern  . Not on file   Social History Narrative  . No narrative on file    Medications:   Current Outpatient Prescriptions on File Prior to Visit  Medication Sig Dispense Refill  . aspirin EC 81 MG tablet Take 81 mg by mouth daily.    Marland Kitchen CALCIUM PO Take 1,200 mg by mouth daily.     . metoprolol succinate (TOPROL-XL) 50 MG 24 hr tablet Take 1 tablet (50 mg total) by mouth daily. Take with or immediately following a meal. 90 tablet 3  . Multiple Vitamins-Minerals (PRESERVISION AREDS PO) Take 1 capsule by  mouth daily.    . Omega-3 Fatty Acids (FISH OIL PO) Take 1 capsule by mouth daily.     No current facility-administered medications on file prior to visit.     Allergies:  No Known Allergies  Physical Exam General: Frail elderly Caucasian lady seated, in no evident distress Head: head normocephalic and atraumatic.   Neck: supple with no carotid or supraclavicular bruits Cardiovascular: regular rate and rhythm, no murmurs Musculoskeletal: no deformity.Severe kyphoscoliosis. Head tilted to the right. Skin:  no rash/petichiae Vascular:  Normal pulses all extremities  Neurologic Exam Mental Status: Awake and fully alert. Oriented to place and time. Recent and  remote memory intact. Attention span, concentration and fund of knowledge appropriate. Mood and affect appropriate.  Cranial Nerves: Fundoscopic exam reveals sharp disc margins. Pupils equal, briskly reactive to light. Extraocular movements full without nystagmus. Visual fields full to confrontation. Hearing diminished bilaterally. Facial sensation intact. Face, tongue, palate moves normally and symmetrically.  Motor: Normal bulk and tone. Normal strength in all tested extremity muscles. Sensory.: intact to touch , pinprick , position and vibratory sensation in upper extremities but diminished vibration from ankle down bilaterally. Romberg sign is positive..  Coordination: Rapid alternating movements normal in all extremities. Finger-to-nose and heel-to-shin performed accurately bilaterally. Patient to unsteady to test for Hallpike maneuver or Fukuda test. Head shaking produces subjective dizziness but without objective nystagmus noted Gait and Station: Arises from chair without difficulty. Stance is normal. Gait demonstrate broad-based and scoliosis to the right . Unable to heel, toe and tandem walk without difficulty.  Reflexes: 1+ and symmetric except left ankle jerk is absent.. Toes downgoing.       ASSESSMENT:  69 year occasional lady with the chronic long-standing intermittent positional dizziness likely due to benign paroxysmal positional  Vertigo.. She also has gait imbalance which is multifactorial due to combination of degenerative spine disease, kyphoscoliosis and mild peripheral neuropathy   PLAN: I had a long discussion with the patient and husband regarding her long-standing symptoms of intermittent positional dizziness likely representing benign paroxysmal positional vertigo. I would recommend referral to physical therapy for vestibular stabilization exercises. Have advised her to move her neck slowly and not to avoid certain movements of the neck particularly to the right and in the  vertical plane. Check MRI scan of the brain with thin section through the internal auditory canal to look for structural or vascular lesions and MRA of the brain and neck. Continue meclizine as needed. Greater than 50% time during this 45 minute consultation visit was spent on counseling and coordination of care about her positional vertigo She will return for follow-up in 2 months or call earlier if necessary. Antony Contras, MD  [facility] [address] [place], [place] [address]  Phone [phone] Fax [phone] Note: This document was prepared with digital dictation and possible smart phrase technology. Any transcriptional errors that result from this process are unintentional.

## 2016-05-14 NOTE — Patient Instructions (Signed)
I had a long discussion with the patient and husband regarding her long-standing symptoms of intermittent positional dizziness likely representing benign paroxysmal positional vertigo. I would recommend referral to physical therapy for vestibular stabilization exercises. Have advised her to move her neck slowly and not to avoid certain movements of the neck particularly to the right and in the vertical plane. Check MRI scan of the brain with thin section through the internal auditory canal to look for structural or vascular lesions and MRA of the brain and neck. Continue meclizine as needed. She will return for follow-up in 2 months or call earlier if necessary.   Benign Positional Vertigo Vertigo is the feeling that you or your surroundings are moving when they are not. Benign positional vertigo is the most common form of vertigo. The cause of this condition is not serious (is benign). This condition is triggered by certain movements and positions (is positional). This condition can be dangerous if it occurs while you are doing something that could endanger you or others, such as driving.  CAUSES In many cases, the cause of this condition is not known. It may be caused by a disturbance in an area of the inner ear that helps your brain to sense movement and balance. This disturbance can be caused by a viral infection (labyrinthitis), head injury, or repetitive motion. RISK FACTORS This condition is more likely to develop in:  Women.  People who are 67 years of age or older. SYMPTOMS Symptoms of this condition usually happen when you move your head or your eyes in different directions. Symptoms may start suddenly, and they usually last for less than a minute. Symptoms may include:  Loss of balance and falling.  Feeling like you are spinning or moving.  Feeling like your surroundings are spinning or moving.  Nausea and vomiting.  Blurred vision.  Dizziness.  Involuntary eye movement  (nystagmus). Symptoms can be mild and cause only slight annoyance, or they can be severe and interfere with daily life. Episodes of benign positional vertigo may return (recur) over time, and they may be triggered by certain movements. Symptoms may improve over time. DIAGNOSIS This condition is usually diagnosed by medical history and a physical exam of the head, neck, and ears. You may be referred to a health care provider who specializes in ear, nose, and throat (ENT) problems (otolaryngologist) or a provider who specializes in disorders of the nervous system (neurologist). You may have additional testing, including:  MRI.  A CT scan.  Eye movement tests. Your health care provider may ask you to change positions quickly while he or she watches you for symptoms of benign positional vertigo, such as nystagmus. Eye movement may be tested with an electronystagmogram (ENG), caloric stimulation, the Dix-Hallpike test, or the roll test.  An electroencephalogram (EEG). This records electrical activity in your brain.  Hearing tests. TREATMENT Usually, your health care provider will treat this by moving your head in specific positions to adjust your inner ear back to normal. Surgery may be needed in severe cases, but this is rare. In some cases, benign positional vertigo may resolve on its own in 2-4 weeks. HOME CARE INSTRUCTIONS Safety  Move slowly.Avoid sudden body or head movements.  Avoid driving.  Avoid operating heavy machinery.  Avoid doing any tasks that would be dangerous to you or others if a vertigo episode would occur.  If you have trouble walking or keeping your balance, try using a cane for stability. If you feel dizzy or unstable,  sit down right away.  Return to your normal activities as told by your health care provider. Ask your health care provider what activities are safe for you. General Instructions  Take over-the-counter and prescription medicines only as told by your  health care provider.  Avoid certain positions or movements as told by your health care provider.  Drink enough fluid to keep your urine clear or pale yellow.  Keep all follow-up visits as told by your health care provider. This is important. SEEK MEDICAL CARE IF:  You have a fever.  Your condition gets worse or you develop new symptoms.  Your family or friends notice any behavioral changes.  Your nausea or vomiting gets worse.  You have numbness or a "pins and needles" sensation. SEEK IMMEDIATE MEDICAL CARE IF:  You have difficulty speaking or moving.  You are always dizzy.  You faint.  You develop severe headaches.  You have weakness in your legs or arms.  You have changes in your hearing or vision.  You develop a stiff neck.  You develop sensitivity to light.   This information is not intended to replace advice given to you by your health care provider. Make sure you discuss any questions you have with your health care provider.   Document Released: 05/27/2006 Document Revised: 05/10/2015 Document Reviewed: 12/12/2014 Elsevier Interactive Patient Education [employer].

## 2016-05-30 ENCOUNTER — Ambulatory Visit: Payer: Medicare Other | Attending: Neurology | Admitting: Physical Therapy

## 2016-05-30 VITALS — BP 152/84 | HR 66

## 2016-05-30 DIAGNOSIS — R42 Dizziness and giddiness: Secondary | ICD-10-CM | POA: Insufficient documentation

## 2016-05-31 NOTE — Therapy (Signed)
[facility] [address] [place], [place], [address] Phone: [phone]   Fax:  [phone]  Physical Therapy Evaluation  Patient Details  Name: Nancy Hayes MRN: [id number] Date of Birth: 08-22-1927 Referring Provider: Dr. Antony Contras  Encounter Date: 05/30/2016      PT End of Session - 05/31/16 0852    Visit Number 1   Number of Visits 1   Authorization Type Medicare   PT Start Time [id number]   PT Stop Time 1401   PT Time Calculation (min) 46 min      Past Medical History:  Diagnosis Date  . Arthritis   . Dizzy spells   . Gout   . Hypertension   . Macular degeneration of both eyes   . Renal disorder   . UTI (lower urinary tract infection)   . Vertigo   . Vision abnormalities     Past Surgical History:  Procedure Laterality Date  . CATARACT EXTRACTION    . PARTIAL HYSTERECTOMY      Vitals:   05/30/16 1346  BP: (!) 152/84  Pulse: 66         Subjective Assessment - 05/31/16 0844    Subjective Pt reports vertigo is mostly constant; she reports no vertigo when she is sitting:  occurs when she looks down and some with sitting up; has been going on intermittently for 30+ years   Pertinent History reports vertebra in spine is pressing against spine - has numbness in arms and feet due to this; h/o vertigo for approx. 30 years   Patient Stated Goals resolve the vertigo   Currently in Pain? No/denies            [facility] PT Assessment - 05/30/16 1326      Assessment   Medical Diagnosis BPPV   Referring Provider Dr. Antony Contras   Onset Date/Surgical Date --  Feb. 2016 - ED visit due to vertigo   Prior Therapy none     Precautions   Precautions Fall;Other (comment)  vertigo     Balance Screen   Has the patient fallen in the past 6 months No   Has the patient had a decrease in activity level because of a fear of falling?  Yes   Is the patient reluctant to leave their home because of a fear of  falling?  No            Vestibular Assessment - 05/31/16 0001      Symptom Behavior   Type of Dizziness "Funny feeling in head"   Frequency of Dizziness varies depending on movement or activity   Duration of Dizziness seconds   Aggravating Factors Activity in general   Relieving Factors Rest     Occulomotor Exam   Occulomotor Alignment Normal     Positional Testing   Dix-Hallpike Dix-Hallpike Right;Dix-Hallpike Left   Sidelying Test Sidelying Right;Sidelying Left     Dix-Hallpike Right   Dix-Hallpike Right Duration none but c/o dizziness /"funny feeling" with sitting up   Dix-Hallpike Right Symptoms No nystagmus     Dix-Hallpike Left   Dix-Hallpike Left Duration no c/o vertigo in test position but c/o funny feeling with sitting up   Dix-Hallpike Left Symptoms No nystagmus     Sidelying Right   Sidelying Right Duration none   Sidelying Right Symptoms No nystagmus     Sidelying Left   Sidelying Left Duration none   Sidelying Left Symptoms No nystagmus  PT Education - 05/31/16 0849    Education provided Yes   Education Details Pt educated in eval results with explanation that no signs/symptoms of BPPV noted at this eval:  pt would benefit from additional sessions to address vestibualr and balance dysfunction but wants to wait unitil after MRI is done on Thurs., 10-5: pt has an hour commute to this clinic - states she is unable to participate in ongoing PT program   Person(s) Educated Patient   Methods Explanation   Comprehension Verbalized understanding                    Plan - 05/31/16 [id number]    Clinical Impression Statement Pt is an 82 year old lady with h/o intermittent vertigo which has been occurring for 30+ years.  No signs/.symptoms consistent with BPPV noted and no nystagmus was abel to be provoked with any posiitonal testing.  Pt reports dizziness/light-headedness with sitting upright from supine or sidelying  positions - dizziness appears to be multi-factorial in etiology.  Pt reports she is scheduled to have MRI on Thurs., 06-06-16.  Pt  states she is unable to participate in ongoing PT sessions due to time constraint with her husband having dialysis 3x/week and due to her having an hour commute to this clinic.  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                                                                                                                            Rehab Potential Fair   PT Frequency 1x / week   PT Duration --  eval only per pt's request   PT Treatment/Interventions ADLs/Self Care Home Management;Patient/family education  Pt evaluation - low complexity   PT Next Visit Plan N/A - pt does not wish to schedule additional visits at this time - wants to wait for MRI results   Recommended Other Services informed pt of OP clinic in [place] to address balance deficits if she is interested in a closer location - BPPV ruled out   Consulted and Agree with Plan of Care Patient      Patient will benefit from skilled therapeutic intervention in order to improve the following deficits and impairments:  Dizziness  Visit Diagnosis: Dizziness and giddiness - Plan: PT plan of care cert/re-cert      G-Codes - [id number]    Functional Assessment Tool Used (-) R and L Dix-Hallpike tests: pt appears to have orthostatic hypotension/multifactorial vertigo   Functional Limitation Changing and maintaining body position   Changing and Maintaining Body Position Current Status [id number]) At least 40 percent but less than 60 percent impaired, limited or restricted   Changing and Maintaining Body Position Goal Status [id number]) At least 40 percent but less than 60 percent impaired, limited or restricted   Changing and Maintaining Body Position Discharge Status [id number]) At least 40  percent but less than 60 percent  impaired, limited or restricted       Problem List Patient Active Problem List   Diagnosis Date Noted  . Essential hypertension 11/23/2015  . PACs (premature atrial contraction)  with trigeminy 06/29/2015    Deddrick Saindon, [place], PT 05/31/2016, 9:06 AM  [facility] [address] [place], [place], [address] Phone: [phone]   Fax:  [phone]  Name: ALEIZA HAMMITT MRN: [id number] Date of Birth: 04-Aug-1927

## 2016-06-04 ENCOUNTER — Other Ambulatory Visit: Payer: Self-pay | Admitting: Family Medicine

## 2016-06-04 DIAGNOSIS — Z1231 Encounter for screening mammogram for malignant neoplasm of breast: Secondary | ICD-10-CM

## 2016-06-06 ENCOUNTER — Ambulatory Visit
Admission: RE | Admit: 2016-06-06 | Discharge: 2016-06-06 | Disposition: A | Discharge status: Home / Self Care | Payer: Medicare Other | Source: Ambulatory Visit | Attending: Neurology | Admitting: Neurology

## 2016-06-06 DIAGNOSIS — G450 Vertebro-basilar artery syndrome: Secondary | ICD-10-CM | POA: Diagnosis not present

## 2016-06-06 DIAGNOSIS — R42 Dizziness and giddiness: Secondary | ICD-10-CM

## 2016-06-06 DIAGNOSIS — H8110 Benign paroxysmal vertigo, unspecified ear: Secondary | ICD-10-CM

## 2016-06-06 MED ORDER — GADOBENATE DIMEGLUMINE 529 MG/ML IV SOLN
12.0000 mL | Freq: Once | INTRAVENOUS | Status: AC | PRN
  Administered 2016-06-06: 12 mL via INTRAVENOUS

## 2016-06-11 ENCOUNTER — Ambulatory Visit
Admission: RE | Admit: 2016-06-11 | Discharge: 2016-06-11 | Disposition: A | Discharge status: Home / Self Care | Payer: Medicare Other | Source: Ambulatory Visit | Attending: Family Medicine | Admitting: Family Medicine

## 2016-06-11 DIAGNOSIS — Z1231 Encounter for screening mammogram for malignant neoplasm of breast: Secondary | ICD-10-CM

## 2016-06-17 ENCOUNTER — Telehealth: Payer: Self-pay | Admitting: Neurology

## 2016-06-17 NOTE — Telephone Encounter (Signed)
Pt called, said she was returning Dr Clydene Fake call. Pt said she will be at home only on Thursday this week if the call is not returned today.

## 2016-06-18 NOTE — Telephone Encounter (Signed)
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 She also has occluded right vertebral artery which along with her degenerative cervical spine changes and spinal stenosis likely accounts for her dizziness. She is not a likely candidate for surgery at this point. I recommend conservative medical treatment and follow-up. She voiced understanding. She was advised to keep her follow-up appointment with me

## 2016-06-19 ENCOUNTER — Telehealth: Payer: Self-pay

## 2016-06-19 NOTE — Telephone Encounter (Signed)
Garvin Fila, MD  Marval Regal, RN        I called and left a message on the patient's answering machine to call me back to discuss the results of recent MRI scan and MRA

## 2016-06-20 NOTE — Telephone Encounter (Signed)
I already spoke to pt on 06/18/16 at 14:56 and gave results

## 2016-07-11 ENCOUNTER — Ambulatory Visit: Payer: Medicare Other | Admitting: Neurology

## 2016-09-17 ENCOUNTER — Encounter: Payer: Self-pay | Admitting: Neurology

## 2016-09-17 ENCOUNTER — Ambulatory Visit (INDEPENDENT_AMBULATORY_CARE_PROVIDER_SITE_OTHER): Payer: Medicare Other | Admitting: Neurology

## 2016-09-17 VITALS — BP 184/79 | HR 63 | Ht 64.0 in | Wt 134.8 lb

## 2016-09-17 DIAGNOSIS — R42 Dizziness and giddiness: Secondary | ICD-10-CM | POA: Diagnosis not present

## 2016-09-17 NOTE — Patient Instructions (Signed)
I had a long discussion with the patient and her son regarding her dizziness which is multifactorial due to combination of vertebrobasilar insufficiency as well as severe spinal stenosis and aging.. I have personally reviewed her imaging studies and discussed the findings with them and answered questions. The patient and family are not keen to consider surgery for spinal stenosis at her age and I agree. I counseled her to get up slowly and avoid certain neck and body movements to avoid falling. We also discussed fall prevention precautions. She will continue aspirin for stroke prevention and maintain strict control of hypertension with blood pressure goal below 130/90. She will return for follow-up in the future only as necessary.   Fall Prevention in the Home Introduction Falls can cause injuries. They can happen to people of all ages. There are many things you can do to make your home safe and to help prevent falls. What can I do on the outside of my home?  Regularly fix the edges of walkways and driveways and fix any cracks.  Remove anything that might make you trip as you walk through a door, such as a raised step or threshold.  Trim any bushes or trees on the path to your home.  Use bright outdoor lighting.  Clear any walking paths of anything that might make someone trip, such as rocks or tools.  Regularly check to see if handrails are loose or broken. Make sure that both sides of any steps have handrails.  Any raised decks and porches should have guardrails on the edges.  Have any leaves, snow, or ice cleared regularly.  Use sand or salt on walking paths during winter.  Clean up any spills in your garage right away. This includes oil or grease spills. What can I do in the bathroom?  Use night lights.  Install grab bars by the toilet and in the tub and shower. Do not use towel bars as grab bars.  Use non-skid mats or decals in the tub or shower.  If you need to sit down in  the shower, use a plastic, non-slip stool.  Keep the floor dry. Clean up any water that spills on the floor as soon as it happens.  Remove soap buildup in the tub or shower regularly.  Attach bath mats securely with double-sided non-slip rug tape.  Do not have throw rugs and other things on the floor that can make you trip. What can I do in the bedroom?  Use night lights.  Make sure that you have a light by your bed that is easy to reach.  Do not use any sheets or blankets that are too big for your bed. They should not hang down onto the floor.  Have a firm chair that has side arms. You can use this for support while you get dressed.  Do not have throw rugs and other things on the floor that can make you trip. What can I do in the kitchen?  Clean up any spills right away.  Avoid walking on wet floors.  Keep items that you use a lot in easy-to-reach places.  If you need to reach something above you, use a strong step stool that has a grab bar.  Keep electrical cords out of the way.  Do not use floor polish or wax that makes floors slippery. If you must use wax, use non-skid floor wax.  Do not have throw rugs and other things on the floor that can make you trip. What  can I do with my stairs?  Do not leave any items on the stairs.  Make sure that there are handrails on both sides of the stairs and use them. Fix handrails that are broken or loose. Make sure that handrails are as long as the stairways.  Check any carpeting to make sure that it is firmly attached to the stairs. Fix any carpet that is loose or worn.  Avoid having throw rugs at the top or bottom of the stairs. If you do have throw rugs, attach them to the floor with carpet tape.  Make sure that you have a light switch at the top of the stairs and the bottom of the stairs. If you do not have them, ask someone to add them for you. What else can I do to help prevent falls?  Wear shoes that:  Do not have high  heels.  Have rubber bottoms.  Are comfortable and fit you well.  Are closed at the toe. Do not wear sandals.  If you use a stepladder:  Make sure that it is fully opened. Do not climb a closed stepladder.  Make sure that both sides of the stepladder are locked into place.  Ask someone to hold it for you, if possible.  Clearly mark and make sure that you can see:  Any grab bars or handrails.  First and last steps.  Where the edge of each step is.  Use tools that help you move around (mobility aids) if they are needed. These include:  Canes.  Walkers.  Scooters.  Crutches.  Turn on the lights when you go into a dark area. Replace any light bulbs as soon as they burn out.  Set up your furniture so you have a clear path. Avoid moving your furniture around.  If any of your floors are uneven, fix them.  If there are any pets around you, be aware of where they are.  Review your medicines with your doctor. Some medicines can make you feel dizzy. This can increase your chance of falling. Ask your doctor what other things that you can do to help prevent falls. This information is not intended to replace advice given to you by your health care provider. Make sure you discuss any questions you have with your health care provider. Document Released: 06/15/2009 Document Revised: 01/25/2016 Document Reviewed: 09/23/2014  2017 [employer]vier

## 2016-09-17 NOTE — Progress Notes (Signed)
[facility] [address] [place]. [place] [address] [phone]       OFFICE FOLLOW UP VISIT NOTE  [name] Date of Birth:  1927-05-10 Medical Record Number:  [id number]   Referring MD:  Francisco Capuchin, PA-C  Reason for Referral:  Vertigo  HPI: Initial Consult 05/14/2016 : [name] is a 83 year old Caucasian lady who is accompanied today by her husband. She has had intermittent dizziness off and on for at least 30+ years. She describes this had transient noticed on in certain positions when she turns her head suddenly to the right or looks up or down. She describes this as a feeling of vertigo and room spinning. This is transient and resolves when she corrects her head position. This is not accompanied by nausea vomiting, blurred vision or loss of vision. She denies any complaint diplopia, numbness or slurred speech. She has been seen by primary care physician over the years and treated with meclizine but short-term relief. She has never seen an ENT specialist or neurologist for these symptoms. Most recently she was started on meclizine which seems to be helping. She has not been asked to do vestibular stabilization exercises. She denies any significant loss of hearing, tinnitus, prior history of ear pain, infection or injury. Is no known prior history of strokes TIAs or significant neurological problems. She had a CT scan of the head done a year ago which I have reviewed shows mild age-related changes of atrophy but no acute abnormality. She's not had any recent brain imaging studies done. These episodes of dizziness seemed to be more prominent during the summer months and more likely when she is looking up or down. She does feel off balance and wobbles for a few days after these episodes. She does have a known history of cervical spinal stenosis at C2 and has been seen by neurosurgeon Dr. Cyndy Freeze but was not felt to be a surgical candidate due to her advanced age. Update  09/17/2016 : She returns for follow-up after last visit 4 months ago. She is accompanied by her son. Patient states that her dizziness appears to have improved. She is learned to be careful and does not get up quickly and make a sudden movement. She did have a follow-up imaging studies and is keen to discuss results with me. MRI scan of the brain done on 06/06/16 personally reviewed by me shows no acute abnormality. 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 MRI of the brain shows no major stenosis. CT scan of the cervical spine and MRI scan cervical spine board shows spinal stenosis which is severe at C2-3. Patient has seen neurosurgeon Dr. Cyndy Freeze and decided not to undergo decompressive spine surgery. ROS:   14 system review of systems is positive for vertigo, dizziness,   and gait imbalance, hearing loss and all other systems negative  PMH:  Past Medical History:  Diagnosis Date  . Arthritis   . Dizzy spells   . Gout   . Hypertension   . Macular degeneration of both eyes   . Renal disorder   . UTI (lower urinary tract infection)   . Vertigo   . Vision abnormalities     Social History:  Social History   Social History  . Marital status: Married    Spouse name: N/A  . Number of  children: N/A  . Years of education: N/A   Occupational History  . Not on file.   Social History Main Topics  . Smoking status: Never Smoker  . Smokeless tobacco: Never Used  . Alcohol use No  . Drug use: No  . Sexual activity: Not on file   Other Topics Concern  . Not on file   Social History Narrative  . No narrative on file    Medications:   Current Outpatient Prescriptions on File Prior to Visit  Medication Sig Dispense Refill  . aspirin EC 81 MG tablet Take 81 mg by mouth daily.    Marland Kitchen CALCIUM PO Take  1,200 mg by mouth daily.     . metoprolol succinate (TOPROL-XL) 50 MG 24 hr tablet Take 1 tablet (50 mg total) by mouth daily. Take with or immediately following a meal. 90 tablet 3  . Multiple Vitamins-Minerals (PRESERVISION AREDS PO) Take 1 capsule by mouth daily.    . Omega-3 Fatty Acids (FISH OIL PO) Take 1 capsule by mouth daily.     No current facility-administered medications on file prior to visit.     Allergies:  No Known Allergies  Physical Exam General: Frail elderly Caucasian lady seated, in no evident distress Head: head normocephalic and atraumatic.   Neck: supple with no carotid or supraclavicular bruits Cardiovascular: regular rate and rhythm, no murmurs Musculoskeletal: no deformity.Severe kyphoscoliosis. Head tilted to the right. Skin:  no rash/petichiae Vascular:  Normal pulses all extremities  Neurologic Exam Mental Status: Awake and fully alert. Oriented to place and time. Recent and remote memory intact. Attention span, concentration and fund of knowledge appropriate. Mood and affect appropriate.  Cranial Nerves: Fundoscopic exam not done. Pupils equal, briskly reactive to light. Extraocular movements full without nystagmus. Visual fields full to confrontation. Hearing diminished bilaterally. Facial sensation intact. Face, tongue, palate moves normally and symmetrically.  Motor: Normal bulk and tone. Normal strength in all tested extremity muscles. Sensory.: intact to touch , pinprick , position and vibratory sensation in upper extremities but diminished vibration from ankle down bilaterally. Romberg sign is positive..  Coordination: Rapid alternating movements normal in all extremities. Finger-to-nose and heel-to-shin performed accurately bilaterally. Patient to unsteady to test for Hallpike maneuver or Fukuda test. Head shaking produces subjective dizziness but without objective nystagmus noted Gait and Station: Arises from chair without difficulty. Stance is normal.  Gait demonstrate broad-based and scoliosis to the right . Unable to heel, toe and tandem walk without difficulty.  Reflexes: 1+ and symmetric except left ankle jerk is absent.. Toes downgoing.       ASSESSMENT:  22 year occasional lady with the chronic long-standing intermittent positional dizziness likely due to benign paroxysmal positional  Vertigo.. She also has gait imbalance which is multifactorial due to combination of degenerative spine disease, kyphoscoliosis and mild peripheral neuropathy   PLAN: I had a long discussion with the patient and her son regarding her dizziness which is multifactorial due to combination of vertebrobasilar insufficiency as well as severe spinal stenosis and aging.. I have personally reviewed her imaging studies and discussed the findings with them and answered questions. The patient and family are not keen to consider surgery for spinal stenosis at her age and I agree. I counseled her to get up slowly and avoid certain neck and body movements to avoid falling. We also discussed fall prevention precautions. She will continue aspirin for stroke prevention and maintain strict control of hypertension with blood pressure goal below 130/90. Greater than 50%  time during this 25 minute visit was spent on counseling and coordination of care and reviewing imaging findings She will return for follow-up in the future only as necessary. Antony Contras, MD  [facility] [address] [place],  [address]  Phone [phone] Fax [phone] Note: This document was prepared with digital dictation and possible smart phrase technology. Any transcriptional errors that result from this process are unintentional.

## 2016-10-18 ENCOUNTER — Ambulatory Visit (INDEPENDENT_AMBULATORY_CARE_PROVIDER_SITE_OTHER): Payer: Medicare Other | Admitting: Cardiovascular Disease

## 2016-10-18 ENCOUNTER — Encounter: Payer: Self-pay | Admitting: Cardiovascular Disease

## 2016-10-18 VITALS — BP 203/85 | HR 62 | Ht 64.0 in | Wt 133.0 lb

## 2016-10-18 DIAGNOSIS — I10 Essential (primary) hypertension: Secondary | ICD-10-CM | POA: Diagnosis not present

## 2016-10-18 DIAGNOSIS — I491 Atrial premature depolarization: Secondary | ICD-10-CM

## 2016-10-18 NOTE — Progress Notes (Signed)
Patient ID: Nancy Hayes, female   DOB: Jul 07, 1927, 83 y.o.   MRN: [id number]    Cardiology Office Note    Date:  10/18/2016   ID:  Nancy Hayes, DOB 14-Apr-1927, MRN [id number]  PCP:  Tula Nakayama  Cardiologist:   Sanda Klein, MD   Chief Complaint  Patient presents with  . Follow-up    Pt states no Sx.    History of Present Illness:  Nancy Hayes is a 83 y.o. female with mild systemic hypertension and symptomatic premature atrial contractions who returns in follow-up.  Her husband of 9 years, [name] died last month. She is accompanied today by their son. They live in the same household.  Even though she is very emotional, her palpitations seem to be symptomatically controlled. Her blood pressure was very high when we initially checked it at 203/85, down to 160/70 after several minutes. He denies angina, dyspnea, leg edema, claudication or focal neurological complaints. As expected, she is sad and teary, but seems to be handling her grief well.  Past Medical History:  Diagnosis Date  . Arthritis   . Dizzy spells   . Gout   . Hypertension   . Macular degeneration of both eyes   . Renal disorder   . UTI (lower urinary tract infection)   . Vertigo   . Vision abnormalities     Past Surgical History:  Procedure Laterality Date  . CATARACT EXTRACTION    . PARTIAL HYSTERECTOMY      Outpatient Medications Prior to Visit  Medication Sig Dispense Refill  . aspirin EC 81 MG tablet Take 81 mg by mouth daily.    Marland Kitchen CALCIUM PO Take 1,200 mg by mouth daily.     . metoprolol succinate (TOPROL-XL) 50 MG 24 hr tablet Take 50 mg by mouth.    . Multiple Vitamins-Minerals (PRESERVISION AREDS PO) Take 1 capsule by mouth daily.    . Omega-3 Fatty Acids (FISH OIL PO) Take 1 capsule by mouth daily.    . metoprolol succinate (TOPROL-XL) 50 MG 24 hr tablet Take 1 tablet (50 mg total) by mouth daily. Take with or immediately following a meal. 90 tablet 3   No facility-administered  medications prior to visit.      Allergies:   Patient has no known allergies.   Social History   Social History  . Marital status: Married    Spouse name: N/A  . Number of children: N/A  . Years of education: N/A   Social History Main Topics  . Smoking status: Never Smoker  . Smokeless tobacco: Never Used  . Alcohol use No  . Drug use: No  . Sexual activity: Not Asked   Other Topics Concern  . None   Social History Narrative  . None     Family History:  The patient's family history includes Hypertension in her mother; Stroke in her sister.   ROS:   Please see the history of present illness.    ROS All other systems reviewed and are negative.   PHYSICAL EXAM:   VS:  BP (!) 203/85   Pulse 62   Ht 5\' 4"  (1.626 m)   Wt 60.3 kg (133 lb)   BMI 22.83 kg/m    GEN: Well nourished, well developed, in no acute distress  HEENT: normal  Neck: no JVD, carotid bruits, or masses Cardiac: RRR baseline with frequent ectopic beats; no murmurs, rubs, or gallops,no edema  Respiratory:  clear to auscultation bilaterally, normal work of  breathing GI: soft, nontender, nondistended, + BS MS: no deformity or atrophy  Skin: warm and dry, no rash Neuro:  Alert and Oriented x 3, Strength and sensation are intact Psych: euthymic mood, full affect  Wt Readings from Last 3 Encounters:  10/18/16 60.3 kg (133 lb)  09/17/16 61.1 kg (134 lb 12.8 oz)  05/14/16 62.1 kg (137 lb)      Studies/Labs Reviewed:   EKG:  EKG is ordered today.  The ekg ordered today demonstrates sinus rhythm with left axis deviation, left ventricular hypertrophy changes. QTC 452 ms. Even though frequent ectopic beats are heard on exam, none were caught on ECG   ASSESSMENT:    No diagnosis found.   PLAN:  In order of problems listed above:  1. PACs: often in a pattern of trigeminy, With symptoms well controlled on beta blocker. 2. HTN: Her blood pressure is understandably elevated since she is so emotional.  No changes were made to her medications today.    Medication Adjustments/Labs and Tests Ordered: Current medicines are reviewed at length with the patient today.  Concerns regarding medicines are outlined above.  Medication changes, Labs and Tests ordered today are listed in the Patient Instructions below. Patient Instructions  Dr Sallyanne Kuster recommends that you schedule a follow-up appointment in 12 months. You will receive a reminder letter in the mail two months in advance. If you don't receive a letter, please call our office to schedule the follow-up appointment.  If you need a refill on your cardiac medications before your next appointment, please call your pharmacy.      Signed, Sanda Klein, MD  10/18/2016 5:34 PM    [address], [place], [place]  [address] Phone: [phone]; Fax: [phone]

## 2016-10-18 NOTE — Patient Instructions (Signed)
Dr Croitoru recommends that you schedule a follow-up appointment in 12 months. You will receive a reminder letter in the mail two months in advance. If you don't receive a letter, please call our office to schedule the follow-up appointment.  If you need a refill on your cardiac medications before your next appointment, please call your pharmacy. 

## 2017-04-06 ENCOUNTER — Emergency Department (HOSPITAL_COMMUNITY): Payer: Medicare Other

## 2017-04-06 ENCOUNTER — Encounter (HOSPITAL_COMMUNITY): Payer: Self-pay

## 2017-04-06 ENCOUNTER — Inpatient Hospital Stay (HOSPITAL_COMMUNITY)
Admission: EM | Admit: 2017-04-06 | Discharge: 2017-04-12 | DRG: 872 | Disposition: A | Discharge status: Home / Self Care | Payer: Medicare Other | Attending: Internal Medicine | Admitting: Internal Medicine

## 2017-04-06 DIAGNOSIS — I471 Supraventricular tachycardia: Secondary | ICD-10-CM | POA: Diagnosis not present

## 2017-04-06 DIAGNOSIS — Z9849 Cataract extraction status, unspecified eye: Secondary | ICD-10-CM

## 2017-04-06 DIAGNOSIS — Z90710 Acquired absence of both cervix and uterus: Secondary | ICD-10-CM

## 2017-04-06 DIAGNOSIS — Z79899 Other long term (current) drug therapy: Secondary | ICD-10-CM | POA: Diagnosis not present

## 2017-04-06 DIAGNOSIS — E876 Hypokalemia: Secondary | ICD-10-CM | POA: Diagnosis present

## 2017-04-06 DIAGNOSIS — H3530 Unspecified macular degeneration: Secondary | ICD-10-CM | POA: Diagnosis present

## 2017-04-06 DIAGNOSIS — I10 Essential (primary) hypertension: Secondary | ICD-10-CM | POA: Diagnosis present

## 2017-04-06 DIAGNOSIS — Z823 Family history of stroke: Secondary | ICD-10-CM | POA: Diagnosis not present

## 2017-04-06 DIAGNOSIS — I495 Sick sinus syndrome: Secondary | ICD-10-CM | POA: Diagnosis not present

## 2017-04-06 DIAGNOSIS — R7881 Bacteremia: Secondary | ICD-10-CM | POA: Diagnosis not present

## 2017-04-06 DIAGNOSIS — Z7982 Long term (current) use of aspirin: Secondary | ICD-10-CM

## 2017-04-06 DIAGNOSIS — D72829 Elevated white blood cell count, unspecified: Secondary | ICD-10-CM

## 2017-04-06 DIAGNOSIS — B9620 Unspecified Escherichia coli [E. coli] as the cause of diseases classified elsewhere: Secondary | ICD-10-CM | POA: Diagnosis present

## 2017-04-06 DIAGNOSIS — A419 Sepsis, unspecified organism: Secondary | ICD-10-CM | POA: Diagnosis present

## 2017-04-06 DIAGNOSIS — I480 Paroxysmal atrial fibrillation: Secondary | ICD-10-CM

## 2017-04-06 DIAGNOSIS — I361 Nonrheumatic tricuspid (valve) insufficiency: Secondary | ICD-10-CM | POA: Diagnosis not present

## 2017-04-06 DIAGNOSIS — N390 Urinary tract infection, site not specified: Secondary | ICD-10-CM | POA: Diagnosis present

## 2017-04-06 DIAGNOSIS — A4151 Sepsis due to Escherichia coli [E. coli]: Secondary | ICD-10-CM | POA: Diagnosis not present

## 2017-04-06 DIAGNOSIS — I4891 Unspecified atrial fibrillation: Secondary | ICD-10-CM | POA: Diagnosis not present

## 2017-04-06 LAB — URINALYSIS, ROUTINE W REFLEX MICROSCOPIC
BILIRUBIN URINE: NEGATIVE
Glucose, UA: NEGATIVE mg/dL
KETONES UR: NEGATIVE mg/dL
Nitrite: NEGATIVE
Protein, ur: 100 mg/dL — AB
SPECIFIC GRAVITY, URINE: 1.025 (ref 1.005–1.030)
pH: 6 (ref 5.0–8.0)

## 2017-04-06 LAB — PROTIME-INR
INR: 1.41
PROTHROMBIN TIME: 17.3 s — AB (ref 11.4–15.2)

## 2017-04-06 LAB — CBC WITH DIFFERENTIAL/PLATELET
BASOS ABS: 0 10*3/uL (ref 0.0–0.1)
Basophils Relative: 0 %
EOS PCT: 0 %
Eosinophils Absolute: 0 10*3/uL (ref 0.0–0.7)
HCT: 38 % (ref 36.0–46.0)
HEMOGLOBIN: 12.5 g/dL (ref 12.0–15.0)
LYMPHS PCT: 3 %
Lymphs Abs: 0.2 10*3/uL — ABNORMAL LOW (ref 0.7–4.0)
MCH: 30.7 pg (ref 26.0–34.0)
MCHC: 32.9 g/dL (ref 30.0–36.0)
MCV: 93.4 fL (ref 78.0–100.0)
Monocytes Absolute: 0 10*3/uL — ABNORMAL LOW (ref 0.1–1.0)
Monocytes Relative: 1 %
NEUTROS PCT: 96 %
Neutro Abs: 7.6 10*3/uL (ref 1.7–7.7)
Platelets: 143 10*3/uL — ABNORMAL LOW (ref 150–400)
RBC: 4.07 MIL/uL (ref 3.87–5.11)
RDW: 14.1 % (ref 11.5–15.5)
WBC: 7.9 10*3/uL (ref 4.0–10.5)

## 2017-04-06 LAB — URINALYSIS, MICROSCOPIC (REFLEX)

## 2017-04-06 LAB — COMPREHENSIVE METABOLIC PANEL
ALBUMIN: 2.6 g/dL — AB (ref 3.5–5.0)
ALT: 21 U/L (ref 14–54)
ANION GAP: 11 (ref 5–15)
AST: 33 U/L (ref 15–41)
Alkaline Phosphatase: 193 U/L — ABNORMAL HIGH (ref 38–126)
BUN: 19 mg/dL (ref 6–20)
CHLORIDE: 106 mmol/L (ref 101–111)
CO2: 19 mmol/L — AB (ref 22–32)
Calcium: 8.4 mg/dL — ABNORMAL LOW (ref 8.9–10.3)
Creatinine, Ser: 0.93 mg/dL (ref 0.44–1.00)
GFR calc non Af Amer: 53 mL/min — ABNORMAL LOW (ref >60)
GLUCOSE: 123 mg/dL — AB (ref 65–99)
POTASSIUM: 2.9 mmol/L — AB (ref 3.5–5.1)
SODIUM: 136 mmol/L (ref 135–145)
Total Bilirubin: 1.7 mg/dL — ABNORMAL HIGH (ref 0.3–1.2)
Total Protein: 6.2 g/dL — ABNORMAL LOW (ref 6.5–8.1)

## 2017-04-06 LAB — I-STAT CG4 LACTIC ACID, ED
LACTIC ACID, VENOUS: 2.47 mmol/L — AB (ref 0.5–1.9)
Lactic Acid, Venous: 3.57 mmol/L (ref 0.5–1.9)

## 2017-04-06 LAB — LACTIC ACID, PLASMA: Lactic Acid, Venous: 1.7 mmol/L (ref 0.5–1.9)

## 2017-04-06 MED ORDER — CEFTRIAXONE SODIUM 2 G IJ SOLR
2.0000 g | Freq: Once | INTRAMUSCULAR | Status: AC
  Administered 2017-04-06: 2 g via INTRAVENOUS
  Filled 2017-04-06 (×2): qty 2

## 2017-04-06 MED ORDER — ENOXAPARIN SODIUM 40 MG/0.4ML ~~LOC~~ SOLN
40.0000 mg | SUBCUTANEOUS | Status: DC
  Administered 2017-04-06 – 2017-04-08 (×3): 40 mg via SUBCUTANEOUS
  Filled 2017-04-06 (×3): qty 0.4

## 2017-04-06 MED ORDER — METOPROLOL SUCCINATE ER 50 MG PO TB24
50.0000 mg | ORAL_TABLET | Freq: Every day | ORAL | Status: DC
  Administered 2017-04-07 – 2017-04-09 (×3): 50 mg via ORAL
  Filled 2017-04-06 (×3): qty 1

## 2017-04-06 MED ORDER — CEFTRIAXONE SODIUM 1 G IJ SOLR: 1.0000 g | INTRAMUSCULAR | Status: DC

## 2017-04-06 MED ORDER — ASPIRIN EC 81 MG PO TBEC
81.0000 mg | DELAYED_RELEASE_TABLET | Freq: Every day | ORAL | Status: DC
  Administered 2017-04-07 – 2017-04-12 (×6): 81 mg via ORAL
  Filled 2017-04-06 (×6): qty 1

## 2017-04-06 MED ORDER — SODIUM CHLORIDE 0.9 % IV BOLUS (SEPSIS)
1000.0000 mL | Freq: Once | INTRAVENOUS | Status: AC
  Administered 2017-04-06: 1000 mL via INTRAVENOUS

## 2017-04-06 MED ORDER — POTASSIUM CHLORIDE CRYS ER 20 MEQ PO TBCR
40.0000 meq | EXTENDED_RELEASE_TABLET | Freq: Once | ORAL | Status: AC
  Administered 2017-04-06: 40 meq via ORAL
  Filled 2017-04-06: qty 2

## 2017-04-06 NOTE — H&P (Signed)
History and Physical    Nancy Hayes Nancy Hayes DOB: April 04, 1927 DOA: 04/06/2017  PCP: Aletha Halim., PA-C  Patient coming from: Home.   I have personally briefly reviewed patient's old medical records in [place]  Chief Complaint: fever , chills since last night.   HPI: Nancy Hayes is a 83 y.o. female with medical history significant of hypertension, arthritis, gout, vertigo, was brought in by her son for fever and chills since last night, associated with flank pain, nausea, vomiting yesterday, . On arrival to ED, she was febrile, tachycardic, . Labs revealed potassium of 2.9, elevated lactic acid. UA IS abnormal.she was referred to medical service for admission for sepsis from UTI. She denies chest pain, sob, palpitations, cough, no sick contacts. No diarrhea or abdominal pain. No dizziness or syncope. She reports weakness and unable to walk from weakness.   Review of Systems: As per HPI otherwise 10 point review of systems negative.    Past Medical History:  Diagnosis Date  . Arthritis   . Dizzy spells   . Gout   . Hypertension   . Macular degeneration of both eyes   . Renal disorder   . UTI (lower urinary tract infection)   . Vertigo   . Vision abnormalities     Past Surgical History:  Procedure Laterality Date  . CATARACT EXTRACTION    . PARTIAL HYSTERECTOMY       reports that she has never smoked. She has never used smokeless tobacco. She reports that she does not drink alcohol or use drugs.  No Known Allergies  Family History  Problem Relation Age of Onset  . Hypertension Mother   . Stroke Sister    Reviewed.   Prior to Admission medications   Medication Sig Start Date End Date Taking? Authorizing Provider  amLODipine (NORVASC) 2.5 MG tablet Take 2.5 mg by mouth daily. 03/24/17  Yes [provider]  aspirin EC 81 MG tablet Take 81 mg by mouth daily.   Yes [provider]  Calcium Carbonate-Vitamin D (CALCIUM-D PO) Take  1,200 mg by mouth daily.   Yes [provider]  metoprolol succinate (TOPROL-XL) 50 MG 24 hr tablet Take 50 mg by mouth daily.  06/04/16  Yes [provider]  Multiple Vitamins-Minerals (PRESERVISION AREDS 2) CAPS Take 2 capsules by mouth daily.   Yes [provider]  Omega-3 Fatty Acids (FISH OIL) 1000 MG CAPS Take 1,000 mg by mouth daily.   Yes [provider]  polyvinyl alcohol (ARTIFICIAL TEARS) 1.4 % ophthalmic solution Place 1 drop into both eyes 2 (two) times daily.   Yes [provider]    Physical Exam: Vitals:   04/06/17 1815 04/06/17 1830 04/06/17 1845 04/06/17 1900  BP: (!) 125/54 111/62 (!) 120/48 119/66  Pulse:  83 (!) 53 (!) 32  Resp:  (!) 26 (!) 23 (!) 30  Temp:      TempSrc:      SpO2: 95% 94% 93% 95%  Weight:      Height:        Constitutional: NAD, calm, comfortable Vitals:   04/06/17 1815 04/06/17 1830 04/06/17 1845 04/06/17 1900  BP: (!) 125/54 111/62 (!) 120/48 119/66  Pulse:  83 (!) 53 (!) 32  Resp:  (!) 26 (!) 23 (!) 30  Temp:      TempSrc:      SpO2: 95% 94% 93% 95%  Weight:      Height:  Eyes: PERRL, lids and conjunctivae normal ENMT: Mucous membranes are moist. Posterior pharynx clear of any exudate or lesions.Normal dentition.  Neck: normal, supple, no masses, no thyromegaly Respiratory:diminished at bases.  Cardiovascular: Regular rate and rhythm, no murmurs / rubs / gallops. No extremity edema. 2+ pedal pulses. No carotid bruits.  Abdomen: no tenderness, no masses palpated. No hepatosplenomegaly. Bowel sounds positive. Right sided flank pain.  Musculoskeletal: no clubbing / cyanosis. No joint deformity upper and lower extremities. Good ROM, no contractures. Normal muscle tone.  Skin: no rashes, lesions, ulcers. No induration Neurologic: CN 2-12 grossly intact. Sensation intact, DTR normal. Strength 5/5 in all 4.  Psychiatric: Normal judgment and insight. Alert and oriented x 3. Normal mood.      Labs on Admission: I have personally reviewed following labs and imaging studies  CBC:  Recent Labs Lab 04/06/17 1620  WBC 7.9  NEUTROABS 7.6  HGB 12.5  HCT 38.0  MCV 93.4  PLT 008*   Basic Metabolic Panel:  Recent Labs Lab 04/06/17 1620  NA 136  K 2.9*  CL 106  CO2 19*  GLUCOSE 123*  BUN 19  CREATININE 0.93  CALCIUM 8.4*   GFR: Estimated Creatinine Clearance: 39.1 mL/min (by C-G formula based on SCr of 0.93 mg/dL). Liver Function Tests:  Recent Labs Lab 04/06/17 1620  AST 33  ALT 21  ALKPHOS 193*  BILITOT 1.7*  PROT 6.2*  ALBUMIN 2.6*   No results for input(s): LIPASE, AMYLASE in the last 168 hours. No results for input(s): AMMONIA in the last 168 hours. Coagulation Profile:  Recent Labs Lab 04/06/17 1620  INR 1.41   Cardiac Enzymes: No results for input(s): CKTOTAL, CKMB, CKMBINDEX, TROPONINI in the last 168 hours. BNP (last 3 results) No results for input(s): PROBNP in the last 8760 hours. HbA1C: No results for input(s): HGBA1C in the last 72 hours. CBG: No results for input(s): GLUCAP in the last 168 hours. Lipid Profile: No results for input(s): CHOL, HDL, LDLCALC, TRIG, CHOLHDL, LDLDIRECT in the last 72 hours. Thyroid Function Tests: No results for input(s): TSH, T4TOTAL, FREET4, T3FREE, THYROIDAB in the last 72 hours. Anemia Panel: No results for input(s): VITAMINB12, FOLATE, FERRITIN, TIBC, IRON, RETICCTPCT in the last 72 hours. Urine analysis:    Component Value Date/Time   COLORURINE YELLOW 04/06/2017 1632   APPEARANCEUR TURBID (A) 04/06/2017 1632   LABSPEC 1.025 04/06/2017 1632   PHURINE 6.0 04/06/2017 1632   GLUCOSEU NEGATIVE 04/06/2017 1632   HGBUR MODERATE (A) 04/06/2017 1632   BILIRUBINUR NEGATIVE 04/06/2017 1632   KETONESUR NEGATIVE 04/06/2017 1632   PROTEINUR 100 (A) 04/06/2017 1632   UROBILINOGEN 1.0 10/27/2009 0051   NITRITE NEGATIVE 04/06/2017 1632   LEUKOCYTESUR LARGE (A) 04/06/2017 1632    Radiological  Exams on Admission: Dg Chest 2 View  Result Date: 04/06/2017 CLINICAL DATA:  Pt reports fever today of 102, UTI dx today, some SOB and weakness x 3-4 days; pt reports she takes meds for HTN; non-smoker EXAM: CHEST  2 VIEW COMPARISON:  10/14/2014 FINDINGS: Heart is mildly enlarged but also accentuated by the technique. Shallow lung inflation. There is pulmonary vascular congestion but no overt edema. Focal left lower lobe atelectasis is present. Small bilateral pleural effusions. There is dense atherosclerotic calcification of the thoracic aorta. IMPRESSION: 1. Cardiomegaly and pulmonary vascular congestion. 2. Left lower lobe atelectasis.  Bilateral pleural effusions. 3.  Aortic atherosclerosis. Electronically Signed   By: Nolon Nations M.D.   On: 04/06/2017 17:12    EKG: Independently  reviewed. Sinus arrhythmia.  Assessment/Plan Active Problems:   Sepsis ([place])  Sepsis: Admit to telemetry. Secondary to UTI.  Blood cultures and urine cultures.  Tylenol prn for fever.  IV rocephin for UTI.  Trend lactic acid.  Gentle hydration as there is some pulmonary vascular congestion.     Hypertension: Well controlled.    Hypokalemia:  Replete as needed.  Repeat in am.  Get mag levels.   DVT prophylaxis:lovenox.  Code Status:full code.  Family Communication:  Discussed with family at bedside.  Disposition Plan: pending PT eval Consults called: none.  Admission status: inpatient/ Lynnda Child MD [facility] Pager [phone]  If 7PM-7AM, please contact night-coverage [email or web address] Password [facility]  04/06/2017, 7:34 PM

## 2017-04-06 NOTE — ED Triage Notes (Signed)
Pt from home with weakness and not feeling well. Pt states her urine was very dark at home

## 2017-04-06 NOTE — ED Notes (Signed)
Pt placed on 2L for comfort  ?

## 2017-04-06 NOTE — ED Provider Notes (Signed)
[place] DEPT Provider Note   CSN: [id number] Arrival date & time: 04/06/17  1549     History   Chief Complaint Chief Complaint  Patient presents with  . Weakness    HPI Nancy Hayes is a 83 y.o. female.  HPI   83 year old female with a history of hypertension, vertigo presents with concern for generalized weakness beginning on Wednesday. Reports as as well as age, she's felt progressively fatigued and weak. Reports she's had some nausea today, and had some dry heaves. Denies dysuria, or frequency. Reports her urine was dark and foul-smelling. Reports she's had some right sided flank and back pain on Friday that has now resolved. Reports she felt chilled at home, but was not sure she had a fever. Denies diarrhea, constipation, significant cough. Reports occasional cough with allergies. Denies other rash. Has hematoma to forearm but denies falls, syncope. Mild dyspnea reported on ROS.   Patient received 1500 mL normal saline in route, and per EMS and patient, is feeling much better. Prior, she was so fatigued she is unable to lift her arms or her legs, but now is able to turn in bed without difficulty.  Past Medical History:  Diagnosis Date  . Arthritis   . Dizzy spells   . Gout   . Hypertension   . Macular degeneration of both eyes   . Renal disorder   . UTI (lower urinary tract infection)   . Vertigo   . Vision abnormalities     Patient Active Problem List   Diagnosis Date Noted  . Essential hypertension 11/23/2015  . PACs (premature atrial contraction)  with trigeminy 06/29/2015    Past Surgical History:  Procedure Laterality Date  . CATARACT EXTRACTION    . PARTIAL HYSTERECTOMY      OB History    No data available       Home Medications    Prior to Admission medications   Medication Sig Start Date End Date Taking? Authorizing Provider  aspirin EC 81 MG tablet Take 81 mg by mouth daily.    [provider]  CALCIUM PO Take 1,200 mg by mouth  daily.     [provider]  metoprolol succinate (TOPROL-XL) 50 MG 24 hr tablet Take 50 mg by mouth. 06/04/16   [provider]  Multiple Vitamins-Minerals (PRESERVISION AREDS PO) Take 1 capsule by mouth daily.    [provider]  Omega-3 Fatty Acids (FISH OIL PO) Take 1 capsule by mouth daily.    [provider]    Family History Family History  Problem Relation Age of Onset  . Hypertension Mother   . Stroke Sister     Social History Social History  Substance Use Topics  . Smoking status: Never Smoker  . Smokeless tobacco: Never Used  . Alcohol use No     Allergies   Patient has no known allergies.   Review of Systems Review of Systems  Constitutional: Positive for appetite change, fatigue and fever.  HENT: Negative for sore throat.   Eyes: Negative for visual disturbance.  Respiratory: Positive for shortness of breath (mild). Negative for cough.   Cardiovascular: Negative for chest pain.  Gastrointestinal: Positive for nausea. Negative for abdominal pain, constipation, diarrhea and vomiting.  Genitourinary: Positive for flank pain. Negative for difficulty urinating and dysuria.  Musculoskeletal: Negative for back pain and neck pain.  Skin: Negative for rash.  Neurological: Negative for syncope, facial asymmetry, speech difficulty, weakness, numbness and headaches.     Physical  Exam Updated Vital Signs BP (!) 117/45   Pulse (!) 105   Temp (!) 102.1 F (38.9 C) (Rectal)   Resp (!) 33   Ht 5\' 6"  (1.676 m)   Wt 61.2 kg (135 lb)   SpO2 (!) 89%   BMI 21.79 kg/m   Physical Exam  Constitutional: She is oriented to person, place, and time. She appears well-developed and well-nourished. No distress.  HENT:  Head: Normocephalic and atraumatic.  Eyes: Conjunctivae and EOM are normal.  Neck: Normal range of motion.  Cardiovascular: Normal rate, regular rhythm, normal heart sounds and intact distal pulses.  Exam reveals no gallop and  no friction rub.   No murmur heard. Pulmonary/Chest: Effort normal and breath sounds normal. No respiratory distress. She has no wheezes. She has no rales.  Abdominal: Soft. She exhibits no distension. There is no tenderness. There is no guarding.  Musculoskeletal: She exhibits no edema or tenderness.  Neurological: She is alert and oriented to person, place, and time.  Skin: Skin is warm and dry. No rash noted. She is not diaphoretic. No erythema.  Nursing note and vitals reviewed.    ED Treatments / Results  Labs (all labs ordered are listed, but only abnormal results are displayed) Labs Reviewed  COMPREHENSIVE METABOLIC PANEL - Abnormal; Notable for the following:       Result Value   Potassium 2.9 (*)    CO2 19 (*)    Glucose, Bld 123 (*)    Calcium 8.4 (*)    Total Protein 6.2 (*)    Albumin 2.6 (*)    Alkaline Phosphatase 193 (*)    Total Bilirubin 1.7 (*)    GFR calc non Af Amer 53 (*)    All other components within normal limits  CBC WITH DIFFERENTIAL/PLATELET - Abnormal; Notable for the following:    Platelets 143 (*)    Lymphs Abs 0.2 (*)    Monocytes Absolute 0.0 (*)    All other components within normal limits  PROTIME-INR - Abnormal; Notable for the following:    Prothrombin Time 17.3 (*)    All other components within normal limits  URINALYSIS, ROUTINE W REFLEX MICROSCOPIC - Abnormal; Notable for the following:    APPearance TURBID (*)    Hgb urine dipstick MODERATE (*)    Protein, ur 100 (*)    Leukocytes, UA LARGE (*)    All other components within normal limits  URINALYSIS, MICROSCOPIC (REFLEX) - Abnormal; Notable for the following:    Bacteria, UA MANY (*)    Squamous Epithelial / LPF 6-30 (*)    All other components within normal limits  I-STAT CG4 LACTIC ACID, ED - Abnormal; Notable for the following:    Lactic Acid, Venous 3.57 (*)    All other components within normal limits  CULTURE, BLOOD (ROUTINE X 2)  CULTURE, BLOOD (ROUTINE X 2)    EKG   EKG Interpretation  Date/Time:  Sunday April 06 2017 16:05:17 EDT Ventricular Rate:  95 PR Interval:    QRS Duration: 116 QT Interval:  362 QTC Calculation: 456 R Axis:   0 Text Interpretation:  Artifact, however suspect sinus rhythm with sinus arrhythmia versus atrial trigeminy Probable RV involvement, suggest recording right precordial leads No significant change since last tracing Confirmed by Gareth Morgan [phone]) on 04/06/2017 5:01:20 PM       Radiology Dg Chest 2 View  Result Date: 04/06/2017 CLINICAL DATA:  Pt reports fever today of 102, UTI dx today, some SOB and  weakness x 3-4 days; pt reports she takes meds for HTN; non-smoker EXAM: CHEST  2 VIEW COMPARISON:  10/14/2014 FINDINGS: Heart is mildly enlarged but also accentuated by the technique. Shallow lung inflation. There is pulmonary vascular congestion but no overt edema. Focal left lower lobe atelectasis is present. Small bilateral pleural effusions. There is dense atherosclerotic calcification of the thoracic aorta. IMPRESSION: 1. Cardiomegaly and pulmonary vascular congestion. 2. Left lower lobe atelectasis.  Bilateral pleural effusions. 3.  Aortic atherosclerosis. Electronically Signed   By: Nolon Nations M.D.   On: 04/06/2017 17:12    Procedures Procedures (including critical care time)  Medications Ordered in ED Medications  cefTRIAXone (ROCEPHIN) 1 g in dextrose 5 % 50 mL IVPB (not administered)  potassium chloride SA (K-DUR,KLOR-CON) CR tablet 40 mEq (not administered)  sodium chloride 0.9 % bolus 1,000 mL (0 mLs Intravenous Stopped 04/06/17 1732)  cefTRIAXone (ROCEPHIN) 2 g in dextrose 5 % 50 mL IVPB (0 g Intravenous Stopped 04/06/17 1712)     Initial Impression / Assessment and Plan / ED Course  I have reviewed the triage vital signs and the nursing notes.  Pertinent labs & imaging results that were available during my care of the patient were reviewed by me and considered in my medical decision making (see  chart for details).     83 year old female with a history of hypertension, vertigo presents with concern for generalized weakness beginning on Wednesday.  Patient febrile to 102.1, tachycardic with normal blood pressures on arrival to the emergency department. Concern for sepsis. Patient received 1500 mL of normal saline with EMS, and additional 1 L was ordered to complete more than 30 mL/kg of normal saline.  Ordered Rocephin with concern for urinary tract infection as source. Urinalysis returned showing signs of UTI. Chest x-ray without findings to suggest pneumonia. Her lactic acid is elevated to 3.57.  No AKI. Consulted hospitalist for admission.  Final Clinical Impressions(s) / ED Diagnoses   Final diagnoses:  Sepsis, due to unspecified organism [facility])  Urinary tract infection without hematuria, site unspecified      New Prescriptions New Prescriptions   No medications on file     Gareth Morgan, MD 04/06/17 [place]

## 2017-04-06 NOTE — ED Notes (Signed)
Pt has a hematoma to the left forearm that is 3in and raised.

## 2017-04-06 NOTE — Progress Notes (Signed)
Pharmacy Antibiotic Note  Nancy Hayes is a 83 y.o. female admitted on 04/06/2017 with weakness and UTI symptoms concerning for UTI. Marland Kitchen  Pharmacy has been consulted for Rocephin dosing.  Plan: 1. Rocephin 1g IV every 24 hours 2. Pharmacy will sign off as no further dose adjustments are expected at this time  Height: 5\' 6"  (167.6 cm) Weight: 135 lb (61.2 kg) IBW/kg (Calculated) : 59.3  Temp (24hrs), Avg:102.1 F (38.9 C), Min:102.1 F (38.9 C), Max:102.1 F (38.9 C)  No results for input(s): WBC, CREATININE, LATICACIDVEN, VANCOTROUGH, VANCOPEAK, VANCORANDOM, GENTTROUGH, GENTPEAK, GENTRANDOM, TOBRATROUGH, TOBRAPEAK, TOBRARND, AMIKACINPEAK, AMIKACINTROU, AMIKACIN in the last 168 hours.  CrCl cannot be calculated (Patient's most recent lab result is older than the maximum 21 days allowed.).    No Known Allergies  Thank you for allowing pharmacy to be a part of this patient's care.  Lawson Radar 04/06/2017 4:23 PM

## 2017-04-07 LAB — BLOOD CULTURE ID PANEL (REFLEXED)
Acinetobacter baumannii: NOT DETECTED
CARBAPENEM RESISTANCE: NOT DETECTED
Candida albicans: NOT DETECTED
Candida glabrata: NOT DETECTED
Candida krusei: NOT DETECTED
Candida parapsilosis: NOT DETECTED
Candida tropicalis: NOT DETECTED
ENTEROCOCCUS SPECIES: NOT DETECTED
Enterobacter cloacae complex: NOT DETECTED
Enterobacteriaceae species: DETECTED — AB
Escherichia coli: DETECTED — AB
HAEMOPHILUS INFLUENZAE: NOT DETECTED
Klebsiella oxytoca: NOT DETECTED
Klebsiella pneumoniae: NOT DETECTED
LISTERIA MONOCYTOGENES: NOT DETECTED
NEISSERIA MENINGITIDIS: NOT DETECTED
Proteus species: NOT DETECTED
Pseudomonas aeruginosa: NOT DETECTED
SERRATIA MARCESCENS: NOT DETECTED
STAPHYLOCOCCUS AUREUS BCID: NOT DETECTED
STAPHYLOCOCCUS SPECIES: NOT DETECTED
STREPTOCOCCUS AGALACTIAE: NOT DETECTED
STREPTOCOCCUS PNEUMONIAE: NOT DETECTED
Streptococcus pyogenes: NOT DETECTED
Streptococcus species: NOT DETECTED

## 2017-04-07 LAB — BASIC METABOLIC PANEL
Anion gap: 8 (ref 5–15)
BUN: 17 mg/dL (ref 6–20)
CALCIUM: 8.7 mg/dL — AB (ref 8.9–10.3)
CO2: 21 mmol/L — ABNORMAL LOW (ref 22–32)
CREATININE: 0.86 mg/dL (ref 0.44–1.00)
Chloride: 107 mmol/L (ref 101–111)
GFR calc non Af Amer: 59 mL/min — ABNORMAL LOW (ref >60)
Glucose, Bld: 180 mg/dL — ABNORMAL HIGH (ref 65–99)
Potassium: 3.3 mmol/L — ABNORMAL LOW (ref 3.5–5.1)
SODIUM: 136 mmol/L (ref 135–145)

## 2017-04-07 LAB — LACTIC ACID, PLASMA: LACTIC ACID, VENOUS: 1.7 mmol/L (ref 0.5–1.9)

## 2017-04-07 MED ORDER — POTASSIUM CHLORIDE CRYS ER 20 MEQ PO TBCR
40.0000 meq | EXTENDED_RELEASE_TABLET | Freq: Two times a day (BID) | ORAL | Status: AC
  Administered 2017-04-07 (×2): 40 meq via ORAL
  Filled 2017-04-07 (×2): qty 2

## 2017-04-07 MED ORDER — AMLODIPINE BESYLATE 2.5 MG PO TABS
2.5000 mg | ORAL_TABLET | Freq: Every day | ORAL | Status: DC
  Administered 2017-04-07 – 2017-04-08 (×2): 2.5 mg via ORAL
  Filled 2017-04-07 (×2): qty 1

## 2017-04-07 MED ORDER — DEXTROSE 5 % IV SOLN
2.0000 g | INTRAVENOUS | Status: DC
  Administered 2017-04-07 – 2017-04-09 (×3): 2 g via INTRAVENOUS
  Filled 2017-04-07 (×5): qty 2

## 2017-04-07 NOTE — Evaluation (Signed)
Physical Therapy Evaluation Patient Details Name: Nancy Hayes MRN: [id number] DOB: 16-Nov-1926 Today's Date: 04/07/2017   History of Present Illness  Patient is a 83 y/o female who presents with fever and chills associated with flank pain, nausea and vomiting. Found to have sepsis secondary to UTI. PMH includes vertigo, macular degeneration. HTN, gout.   Clinical Impression  Patient presents with impaired sensation Bil feet/hands, impaired balance and decreased mobility s/p above. Tolerated gait training with Min guard-Min A for balance/safety. Pt independent from home with son. Sp02 remained >92% on RA throughout mobility. Eager to return home. May benefit from [place] for support during mobility to decrease fall risk at home. Will follow acutely to maximize independence and mobility prior to return home.     Follow Up Recommendations Home health PT;Supervision for mobility/OOB    Equipment Recommendations  None recommended by PT    Recommendations for Other Services OT consult     Precautions / Restrictions Precautions Precautions: Fall Restrictions Weight Bearing Restrictions: No      Mobility  Bed Mobility               General bed mobility comments: Sitting in chair upon PT arrival.   Transfers Overall transfer level: Needs assistance Equipment used: None Transfers: Sit to/from Stand Sit to Stand: Min guard         General transfer comment: Min guard for safety. Multiple attempts to stand from low chair using momentum.   Ambulation/Gait Ambulation/Gait assistance: Min assist;Min guard Ambulation Distance (Feet): 150 Feet Assistive device: None Gait Pattern/deviations: Step-through pattern;Step-to pattern;Decreased stride length;Decreased step length - right;Decreased step length - left;Narrow base of support;Drifts right/left;Trunk flexed Gait velocity: decreased Gait velocity interpretation: <1.8 ft/sec, indicative of risk for recurrent falls General Gait  Details: Slow, mildly unsteady gait with difficulty turning needing UE support; instability noted requiring Min A at times; 1/4 DOE. Sp02 remained >92% on RA. HR stable. Might benefit from RW.   Stairs            Wheelchair Mobility    Modified Rankin (Stroke Patients Only)       Balance Overall balance assessment: Needs assistance Sitting-balance support: Feet supported;No upper extremity supported Sitting balance-Leahy Scale: Fair     Standing balance support: During functional activity Standing balance-Leahy Scale: Fair                               Pertinent Vitals/Pain Pain Assessment: No/denies pain    Home Living Family/patient expects to be discharged to:: Private residence Living Arrangements: Children (with son) Available Help at Discharge: Family;Available 24 hours/day Type of Home: House Home Access: Level entry     Home Layout: One level Home Equipment: Walker - 2 wheels;Cane - single point      Prior Function Level of Independence: Independent         Comments: Does not drive. Can do housework. eats out for most of meals with son. Reports no falls in last 6 months.     Hand Dominance        Extremity/Trunk Assessment   Upper Extremity Assessment Upper Extremity Assessment:  (IMpaired sensation bil hands. Difficulty with fine motor tasks.)    Lower Extremity Assessment Lower Extremity Assessment: LLE deficits/detail;RLE deficits/detail RLE Sensation: decreased light touch LLE Sensation: decreased light touch    Cervical / Trunk Assessment Cervical / Trunk Assessment: Kyphotic  Communication   Communication: No difficulties  Cognition Arousal/Alertness: Awake/alert  Behavior During Therapy: WFL for tasks assessed/performed Overall Cognitive Status: Within Functional Limits for tasks assessed                                        General Comments General comments (skin integrity, edema, etc.): Family  present during session.    Exercises     Assessment/Plan    PT Assessment Patient needs continued PT services  PT Problem List Decreased mobility;Impaired sensation;Decreased balance;Decreased knowledge of use of DME;Decreased activity tolerance;Cardiopulmonary status limiting activity;Decreased coordination       PT Treatment Interventions Therapeutic activities;Gait training;Therapeutic exercise;Patient/family education;Balance training;Functional mobility training    PT Goals (Current goals can be found in the Care Plan section)  Acute Rehab PT Goals Patient Stated Goal: "to get out of here" PT Goal Formulation: With patient Time For Goal Achievement: 04/21/17 Potential to Achieve Goals: Fair    Frequency Min 3X/week   Barriers to discharge        Co-evaluation               AM-PAC PT "6 Clicks" Daily Activity  Outcome Measure Difficulty turning over in bed (including adjusting bedclothes, sheets and blankets)?: None Difficulty moving from lying on back to sitting on the side of the bed? : None Difficulty sitting down on and standing up from a chair with arms (e.g., wheelchair, bedside commode, etc,.)?: None Help needed moving to and from a bed to chair (including a wheelchair)?: A Little Help needed walking in hospital room?: A Little Help needed climbing 3-5 steps with a railing? : A Lot 6 Click Score: 20    End of Session Equipment Utilized During Treatment: Gait belt Activity Tolerance: Patient tolerated treatment well Patient left: in chair;with call bell/phone within reach;with family/visitor present;with chair alarm set Nurse Communication: Mobility status PT Visit Diagnosis: Unsteadiness on feet (R26.81);Muscle weakness (generalized) (M62.81);Difficulty in walking, not elsewhere classified (R26.2)    Time: 4709-6283 PT Time Calculation (min) (ACUTE ONLY): 21 min   Charges:   PT Evaluation $PT Eval Moderate Complexity: 1 Mod     PT G CodesWray Kearns, PT, DPT [phone]    Marguarite Arbour A Lucilla Petrenko 04/07/2017, 2:27 PM

## 2017-04-07 NOTE — Progress Notes (Signed)
PHARMACY - PHYSICIAN COMMUNICATION CRITICAL VALUE ALERT - BLOOD CULTURE IDENTIFICATION (BCID)  Results for orders placed or performed during the hospital encounter of 04/06/17  Blood Culture ID Panel (Reflexed) (Collected: 04/06/2017  4:46 PM)  Result Value Ref Range   Enterococcus species NOT DETECTED NOT DETECTED   Listeria monocytogenes NOT DETECTED NOT DETECTED   Staphylococcus species NOT DETECTED NOT DETECTED   Staphylococcus aureus NOT DETECTED NOT DETECTED   Streptococcus species NOT DETECTED NOT DETECTED   Streptococcus agalactiae NOT DETECTED NOT DETECTED   Streptococcus pneumoniae NOT DETECTED NOT DETECTED   Streptococcus pyogenes NOT DETECTED NOT DETECTED   Acinetobacter baumannii NOT DETECTED NOT DETECTED   Enterobacteriaceae species DETECTED (A) NOT DETECTED   Enterobacter cloacae complex NOT DETECTED NOT DETECTED   Escherichia coli DETECTED (A) NOT DETECTED   Klebsiella oxytoca NOT DETECTED NOT DETECTED   Klebsiella pneumoniae NOT DETECTED NOT DETECTED   Proteus species NOT DETECTED NOT DETECTED   Serratia marcescens NOT DETECTED NOT DETECTED   Carbapenem resistance NOT DETECTED NOT DETECTED   Haemophilus influenzae NOT DETECTED NOT DETECTED   Neisseria meningitidis NOT DETECTED NOT DETECTED   Pseudomonas aeruginosa NOT DETECTED NOT DETECTED   Candida albicans NOT DETECTED NOT DETECTED   Candida glabrata NOT DETECTED NOT DETECTED   Candida krusei NOT DETECTED NOT DETECTED   Candida parapsilosis NOT DETECTED NOT DETECTED   Candida tropicalis NOT DETECTED NOT DETECTED    Name of physician (or Provider) Contacted: Dr. Karleen Hampshire  Changes to prescribed antibiotics required: Increase ceftriaxone dose to 2 grams IV every 24 hours.   Vincenza Hews, PharmD, BCPS 04/07/2017, 8:33 AM

## 2017-04-07 NOTE — Progress Notes (Signed)
PROGRESS NOTE    Nancy Hayes  TKZ:[id number] DOB: 1927-08-14 DOA: 04/06/2017 PCP: Aletha Halim., PA-C   Brief Narrative: Nancy Hayes is a 83 y.o. female with medical history significant of hypertension, arthritis, gout, vertigo, was brought in by her son for fever and chills since last night, associated with flank pain, nausea, vomiting yesterday, . On arrival to ED, she was febrile, tachycardic, . Labs revealed potassium of 2.9, elevated lactic acid. UA IS abnormal.she was referred to medical service for admission for sepsis from UTI.   Assessment & Plan:   Active Problems:   Sepsis ([place])   Sepsis from UTi and Ecoli bacteremia:  Start her on IV antibiotics , IV rocephin, urine cultures pending.  Blood cultures show e coli, sensitivities are pending.  Lactic acid normalized.    Hypertension: well controlled. Resume metoprolol.   Hypokalemia: replace as needed.      DVT prophylaxis: (Lovenox) Code Status: (Full) Family Communication: discussed with brother at bedside.  Disposition Plan: (pending further evaluation by PT. .    Consultants:   None.    Procedures: NONE.   Antimicrobials: ROCEPHIN since admission.   Subjective: No new complaints.   Objective: Vitals:   04/06/17 2005 04/06/17 2006 04/07/17 0515 04/07/17 0909  BP: (!) 108/37 (!) 106/37 (!) 130/55 (!) 142/69  Pulse: (!) 58  60   Resp: 18  18   Temp: 97.7 F (36.5 C)  98 F (36.7 C)   TempSrc: Oral     SpO2: 95%  98%   Weight: 62.4 kg (137 lb 8 oz)     Height: 5\' 6"  (1.676 m)       Intake/Output Summary (Last 24 hours) at 04/07/17 1034 Last data filed at 04/07/17 0655  Gross per 24 hour  Intake             2670 ml  Output              400 ml  Net             2270 ml   Filed Weights   04/06/17 1606 04/06/17 2005  Weight: 61.2 kg (135 lb) 62.4 kg (137 lb 8 oz)    Examination:  General exam: Appears calm and comfortable  Respiratory system: Clear to auscultation. Respiratory  effort normal. Cardiovascular system: S1 & S2 heard, RRR. No JVD, murmurs, rubs, gallops or clicks. No pedal edema. Gastrointestinal system: Abdomen is nondistended, soft and nontender. No organomegaly or masses felt. Normal bowel sounds heard. Central nervous system: Alert and oriented. No focal neurological deficits. Extremities: Symmetric 5 x 5 power. Skin: No rashes, lesions or ulcers Psychiatry: Judgement and insight appear normal. Mood & affect appropriate.     Data Reviewed: I have personally reviewed following labs and imaging studies  CBC:  Recent Labs Lab 04/06/17 1620  WBC 7.9  NEUTROABS 7.6  HGB 12.5  HCT 38.0  MCV 93.4  PLT 573*   Basic Metabolic Panel:  Recent Labs Lab 04/06/17 1620 04/06/17 2359  NA 136 136  K 2.9* 3.3*  CL 106 107  CO2 19* 21*  GLUCOSE 123* 180*  BUN 19 17  CREATININE 0.93 0.86  CALCIUM 8.4* 8.7*   GFR: Estimated Creatinine Clearance: 42.3 mL/min (by C-G formula based on SCr of 0.86 mg/dL). Liver Function Tests:  Recent Labs Lab 04/06/17 1620  AST 33  ALT 21  ALKPHOS 193*  BILITOT 1.7*  PROT 6.2*  ALBUMIN 2.6*   No results for input(s):  LIPASE, AMYLASE in the last 168 hours. No results for input(s): AMMONIA in the last 168 hours. Coagulation Profile:  Recent Labs Lab 04/06/17 1620  INR 1.41   Cardiac Enzymes: No results for input(s): CKTOTAL, CKMB, CKMBINDEX, TROPONINI in the last 168 hours. BNP (last 3 results) No results for input(s): PROBNP in the last 8760 hours. HbA1C: No results for input(s): HGBA1C in the last 72 hours. CBG: No results for input(s): GLUCAP in the last 168 hours. Lipid Profile: No results for input(s): CHOL, HDL, LDLCALC, TRIG, CHOLHDL, LDLDIRECT in the last 72 hours. Thyroid Function Tests: No results for input(s): TSH, T4TOTAL, FREET4, T3FREE, THYROIDAB in the last 72 hours. Anemia Panel: No results for input(s): VITAMINB12, FOLATE, FERRITIN, TIBC, IRON, RETICCTPCT in the last 72  hours. Sepsis Labs:  Recent Labs Lab 04/06/17 1632 04/06/17 1936 04/06/17 2056 04/06/17 2359  LATICACIDVEN 3.57* 2.47* 1.7 1.7    Recent Results (from the past 240 hour(s))  Culture, blood (Routine x 2)     Status: None (Preliminary result)   Collection Time: 04/06/17  4:26 PM  Result Value Ref Range Status   Specimen Description BLOOD RIGHT FOREARM  Final   Special Requests   Final    BOTTLES DRAWN AEROBIC AND ANAEROBIC Blood Culture adequate volume   Culture  Setup Time   Final    GRAM NEGATIVE RODS AEROBIC BOTTLE ONLY CRITICAL VALUE NOTED.  VALUE IS CONSISTENT WITH PREVIOUSLY REPORTED AND CALLED VALUE.    Culture GRAM NEGATIVE RODS  Final   Report Status PENDING  Incomplete  Culture, blood (Routine x 2)     Status: None (Preliminary result)   Collection Time: 04/06/17  4:46 PM  Result Value Ref Range Status   Specimen Description BLOOD LEFT WRIST  Final   Special Requests   Final    BOTTLES DRAWN AEROBIC AND ANAEROBIC Blood Culture adequate volume   Culture  Setup Time   Final    GRAM NEGATIVE RODS ANAEROBIC BOTTLE ONLY CRITICAL RESULT CALLED TO, READ BACK BY AND VERIFIED WITH: A. [facility] 04/07/17 0825 BEAMJ    Culture NO GROWTH < 24 HOURS  Final   Report Status PENDING  Incomplete  Blood Culture ID Panel (Reflexed)     Status: Abnormal   Collection Time: 04/06/17  4:46 PM  Result Value Ref Range Status   Enterococcus species NOT DETECTED NOT DETECTED Final   Listeria monocytogenes NOT DETECTED NOT DETECTED Final   Staphylococcus species NOT DETECTED NOT DETECTED Final   Staphylococcus aureus NOT DETECTED NOT DETECTED Final   Streptococcus species NOT DETECTED NOT DETECTED Final   Streptococcus agalactiae NOT DETECTED NOT DETECTED Final   Streptococcus pneumoniae NOT DETECTED NOT DETECTED Final   Streptococcus pyogenes NOT DETECTED NOT DETECTED Final   Acinetobacter baumannii NOT DETECTED NOT DETECTED Final   Enterobacteriaceae species DETECTED (A) NOT  DETECTED Final    Comment: Enterobacteriaceae represent a large family of gram-negative bacteria, not a single organism. CRITICAL RESULT CALLED TO, READ BACK BY AND VERIFIED WITH: A. [facility] 04/17/17 0825 BEAMJ    Enterobacter cloacae complex NOT DETECTED NOT DETECTED Final   Escherichia coli DETECTED (A) NOT DETECTED Final    Comment: CRITICAL RESULT CALLED TO, READ BACK BY AND VERIFIED WITH: A. [facility] 04/07/17 0825 BEAMJ    Klebsiella oxytoca NOT DETECTED NOT DETECTED Final   Klebsiella pneumoniae NOT DETECTED NOT DETECTED Final   Proteus species NOT DETECTED NOT DETECTED Final   Serratia marcescens NOT DETECTED NOT DETECTED Final  Carbapenem resistance NOT DETECTED NOT DETECTED Final   Haemophilus influenzae NOT DETECTED NOT DETECTED Final   Neisseria meningitidis NOT DETECTED NOT DETECTED Final   Pseudomonas aeruginosa NOT DETECTED NOT DETECTED Final   Candida albicans NOT DETECTED NOT DETECTED Final   Candida glabrata NOT DETECTED NOT DETECTED Final   Candida krusei NOT DETECTED NOT DETECTED Final   Candida parapsilosis NOT DETECTED NOT DETECTED Final   Candida tropicalis NOT DETECTED NOT DETECTED Final         Radiology Studies: Dg Chest 2 View  Result Date: 04/06/2017 CLINICAL DATA:  Pt reports fever today of 102, UTI dx today, some SOB and weakness x 3-4 days; pt reports she takes meds for HTN; non-smoker EXAM: CHEST  2 VIEW COMPARISON:  10/14/2014 FINDINGS: Heart is mildly enlarged but also accentuated by the technique. Shallow lung inflation. There is pulmonary vascular congestion but no overt edema. Focal left lower lobe atelectasis is present. Small bilateral pleural effusions. There is dense atherosclerotic calcification of the thoracic aorta. IMPRESSION: 1. Cardiomegaly and pulmonary vascular congestion. 2. Left lower lobe atelectasis.  Bilateral pleural effusions. 3.  Aortic atherosclerosis. Electronically Signed   By: Nolon Nations M.D.   On: 04/06/2017  17:12        Scheduled Meds: . aspirin EC  81 mg Oral Daily  . enoxaparin (LOVENOX) injection  40 mg Subcutaneous Q24H  . metoprolol succinate  50 mg Oral Daily  . potassium chloride  40 mEq Oral BID   Continuous Infusions: . cefTRIAXone (ROCEPHIN)  IV       LOS: 1 day    Time spent: 35 minutes.     Hosie Poisson, MD [facility] Pager [phone]  If 7PM-7AM, please contact night-coverage [email or web address] Password [email or web address] 04/07/2017, 10:34 AM

## 2017-04-07 NOTE — Progress Notes (Signed)
Pt arrived room in the presence of son. Pt assessed to be AxOx4. Vital signs completed. Skin check completed. Pt was oriented to the room. Cardiac telemetry was initiated. Will continue to monitor pt.

## 2017-04-08 DIAGNOSIS — E876 Hypokalemia: Secondary | ICD-10-CM | POA: Diagnosis present

## 2017-04-08 LAB — CBC
HCT: 35.5 % — ABNORMAL LOW (ref 36.0–46.0)
Hemoglobin: 12 g/dL (ref 12.0–15.0)
MCH: 31.3 pg (ref 26.0–34.0)
MCHC: 33.8 g/dL (ref 30.0–36.0)
MCV: 92.4 fL (ref 78.0–100.0)
PLATELETS: 169 10*3/uL (ref 150–400)
RBC: 3.84 MIL/uL — ABNORMAL LOW (ref 3.87–5.11)
RDW: 14.2 % (ref 11.5–15.5)
WBC: 12.6 10*3/uL — ABNORMAL HIGH (ref 4.0–10.5)

## 2017-04-08 LAB — BASIC METABOLIC PANEL
Anion gap: 6 (ref 5–15)
BUN: 10 mg/dL (ref 6–20)
CALCIUM: 9 mg/dL (ref 8.9–10.3)
CO2: 24 mmol/L (ref 22–32)
CREATININE: 0.64 mg/dL (ref 0.44–1.00)
Chloride: 107 mmol/L (ref 101–111)
GFR calc Af Amer: >60 mL/min (ref >60)
GFR calc non Af Amer: >60 mL/min (ref >60)
GLUCOSE: 110 mg/dL — AB (ref 65–99)
Potassium: 4.5 mmol/L (ref 3.5–5.1)
Sodium: 137 mmol/L (ref 135–145)

## 2017-04-08 MED ORDER — HYDRALAZINE HCL 20 MG/ML IJ SOLN
5.0000 mg | INTRAMUSCULAR | Status: DC | PRN
  Administered 2017-04-10: 5 mg via INTRAVENOUS
  Filled 2017-04-08: qty 1

## 2017-04-08 MED ORDER — AMLODIPINE BESYLATE 2.5 MG PO TABS
2.5000 mg | ORAL_TABLET | Freq: Once | ORAL | Status: AC
  Administered 2017-04-08: 2.5 mg via ORAL
  Filled 2017-04-08: qty 1

## 2017-04-08 MED ORDER — AMLODIPINE BESYLATE 5 MG PO TABS
5.0000 mg | ORAL_TABLET | Freq: Every day | ORAL | Status: DC
  Administered 2017-04-09: 5 mg via ORAL
  Filled 2017-04-08: qty 1

## 2017-04-08 MED ORDER — AMLODIPINE BESYLATE 2.5 MG PO TABS: 2.5000 mg | ORAL_TABLET | Freq: Once | ORAL | Status: DC

## 2017-04-08 MED ORDER — AMLODIPINE BESYLATE 5 MG PO TABS: 5.0000 mg | ORAL_TABLET | Freq: Once | ORAL | Status: DC

## 2017-04-08 NOTE — Progress Notes (Signed)
Physical Therapy Treatment Patient Details Name: Nancy Hayes MRN: [id number] DOB: 1927/03/20 Today's Date: 04/08/2017    History of Present Illness Patient is a 83 y/o female who presents with fever and chills associated with flank pain, nausea and vomiting. Found to have sepsis secondary to UTI. PMH includes vertigo, macular degeneration. HTN, gout.     PT Comments    Patient tolerated session well and eager to mobilize. Pt required min guard/min A for mobility and much more steady with use of RW. Pt and son reported they have RW at home. Pt agreeable to using RW upon d/c home due to posterior LOB during session without UE support. Current plan remains appropriate.    Follow Up Recommendations  Home health PT;Supervision for mobility/OOB     Equipment Recommendations  None recommended by PT    Recommendations for Other Services OT consult     Precautions / Restrictions Precautions Precautions: Fall    Mobility  Bed Mobility               General bed mobility comments: pt OOB in chair upon arrival  Transfers Overall transfer level: Needs assistance Equipment used: None Transfers: Sit to/from Stand Sit to Stand: Min guard         General transfer comment: multiple attempts to stand from recliner; cues to use momentum to come into standing; pt able to stand from commode with use of grab bar  Ambulation/Gait Ambulation/Gait assistance: Min assist;Min guard Ambulation Distance (Feet): 180 Feet Assistive device: Rolling walker (2 wheeled) Gait Pattern/deviations: Step-through pattern;Decreased stride length;Decreased step length - right;Decreased step length - left;Narrow base of support;Drifts right/left;Trunk flexed;Shuffle Gait velocity: decreased   General Gait Details: initially pt ambulated without AD and min A in room with unsteadiness and pt "furniture walking"; pt with posterior LOB at sink and required assist to recover; RW used rest of session for  ambulation and pt much more steady; cues for posture and safe use of AD   Stairs            Wheelchair Mobility    Modified Rankin (Stroke Patients Only)       Balance   Sitting-balance support: Feet supported;No upper extremity supported Sitting balance-Leahy Scale: Fair       Standing balance-Leahy Scale: Fair                              Cognition Arousal/Alertness: Awake/alert Behavior During Therapy: WFL for tasks assessed/performed Overall Cognitive Status: Within Functional Limits for tasks assessed                                        Exercises      General Comments        Pertinent Vitals/Pain Pain Assessment: No/denies pain    Home Living                      Prior Function            PT Goals (current goals can now be found in the care plan section) Progress towards PT goals: Progressing toward goals    Frequency    Min 3X/week      PT Plan Current plan remains appropriate    Co-evaluation              AM-PAC PT "6  Clicks" Daily Activity  Outcome Measure  Difficulty turning over in bed (including adjusting bedclothes, sheets and blankets)?: None Difficulty moving from lying on back to sitting on the side of the bed? : None Difficulty sitting down on and standing up from a chair with arms (e.g., wheelchair, bedside commode, etc,.)?: A Little Help needed moving to and from a bed to chair (including a wheelchair)?: A Little Help needed walking in hospital room?: A Little Help needed climbing 3-5 steps with a railing? : A Little 6 Click Score: 20    End of Session Equipment Utilized During Treatment: Gait belt Activity Tolerance: Patient tolerated treatment well Patient left: in chair;with call bell/phone within reach;with family/visitor present Nurse Communication: Mobility status PT Visit Diagnosis: Unsteadiness on feet (R26.81);Muscle weakness (generalized) (M62.81);Difficulty in  walking, not elsewhere classified (R26.2)     Time: 1500-1520 PT Time Calculation (min) (ACUTE ONLY): 20 min  Charges:  $Gait Training: 8-22 mins                    G Codes:       Earney Navy, PTA Pager: [phone]     Darliss Cheney 04/08/2017, 3:32 PM

## 2017-04-08 NOTE — Progress Notes (Signed)
PROGRESS NOTE    [name]  ION:[id number] DOB: 19-Sep-1926 DOA: 04/06/2017 PCP: Aletha Halim., PA-C   Brief Narrative: Nancy Hayes is a 83 y.o. female with medical history significant of hypertension, arthritis, gout, vertigo, was brought in by her son for fever and chills since last night, associated with flank pain, nausea, vomiting yesterday, . On arrival to ED, she was febrile, tachycardic, . Labs revealed potassium of 2.9, elevated lactic acid. UA IS abnormal.she was referred to medical service for admission for sepsis from UTI.   Assessment & Plan:   Active Problems:   Sepsis ([place])   Sepsis from UTI and Ecoli bacteremia:  Start her on IV antibiotics , IV rocephin, urine cultures pending.  Blood cultures show Ecoli, sensitivities are pending.  Lactic acid normalized.    Hypertension: sub optimal, prn hydralazine ordered in addition to metoprolol and norvasc.  Hypokalemia: replace as needed.   Leukocytosis:  Possibly from sepsis.  Monitor prn .      DVT prophylaxis: (Lovenox) Code Status: (Full) Family Communication: discussed with son at bedside.  Disposition Plan: home with home health PT.    Consultants:   None.    Procedures: NONE.   Antimicrobials: ROCEPHIN since admission.   Subjective: No new complaints.  Wants to go home.   Objective: Vitals:   04/07/17 2214 04/07/17 2330 04/08/17 0424 04/08/17 1119  BP: (!) 162/59 138/78 (!) 168/69 (!) 171/61  Pulse: 63  73 63  Resp:   (!) 22   Temp:   98.6 F (37 C)   TempSrc:      SpO2:   93% 97%  Weight:      Height:        Intake/Output Summary (Last 24 hours) at 04/08/17 1544 Last data filed at 04/08/17 0925  Gross per 24 hour  Intake               50 ml  Output             2550 ml  Net            -2500 ml   Filed Weights   04/06/17 1606 04/06/17 2005  Weight: 61.2 kg (135 lb) 62.4 kg (137 lb 8 oz)    Examination:  General exam: Appears calm and comfortable , OFF oxygen.    Respiratory system: Clear to auscultation. Respiratory effort normal. No wheezing or rhonchi.  Cardiovascular system: S1 & S2 heard, RRR. No JVD, murmurs, rubs, gallops or clicks. No pedal edema. Gastrointestinal system: Abdomen is soft NT ND BS+ Central nervous system: Alert and oriented. No focal neurological deficits. Extremities: Symmetric 5 x 5 power. Skin: No rashes, lesions or ulcers Psychiatry: Judgement and insight appear normal. Mood & affect appropriate.     Data Reviewed: I have personally reviewed following labs and imaging studies  CBC:  Recent Labs Lab 04/06/17 1620 04/08/17 0840  WBC 7.9 12.6*  NEUTROABS 7.6  --   HGB 12.5 12.0  HCT 38.0 35.5*  MCV 93.4 92.4  PLT 143* 244   Basic Metabolic Panel:  Recent Labs Lab 04/06/17 1620 04/06/17 2359 04/08/17 0840  NA 136 136 137  K 2.9* 3.3* 4.5  CL 106 107 107  CO2 19* 21* 24  GLUCOSE 123* 180* 110*  BUN 19 17 10   CREATININE 0.93 0.86 0.64  CALCIUM 8.4* 8.7* 9.0   GFR: Estimated Creatinine Clearance: 45.5 mL/min (by C-G formula based on SCr of 0.64 mg/dL). Liver Function Tests:  Recent  Labs Lab 04/06/17 1620  AST 33  ALT 21  ALKPHOS 193*  BILITOT 1.7*  PROT 6.2*  ALBUMIN 2.6*   No results for input(s): LIPASE, AMYLASE in the last 168 hours. No results for input(s): AMMONIA in the last 168 hours. Coagulation Profile:  Recent Labs Lab 04/06/17 1620  INR 1.41   Cardiac Enzymes: No results for input(s): CKTOTAL, CKMB, CKMBINDEX, TROPONINI in the last 168 hours. BNP (last 3 results) No results for input(s): PROBNP in the last 8760 hours. HbA1C: No results for input(s): HGBA1C in the last 72 hours. CBG: No results for input(s): GLUCAP in the last 168 hours. Lipid Profile: No results for input(s): CHOL, HDL, LDLCALC, TRIG, CHOLHDL, LDLDIRECT in the last 72 hours. Thyroid Function Tests: No results for input(s): TSH, T4TOTAL, FREET4, T3FREE, THYROIDAB in the last 72 hours. Anemia  Panel: No results for input(s): VITAMINB12, FOLATE, FERRITIN, TIBC, IRON, RETICCTPCT in the last 72 hours. Sepsis Labs:  Recent Labs Lab 04/06/17 1632 04/06/17 1936 04/06/17 2056 04/06/17 2359  LATICACIDVEN 3.57* 2.47* 1.7 1.7    Recent Results (from the past 240 hour(s))  Culture, blood (Routine x 2)     Status: None (Preliminary result)   Collection Time: 04/06/17  4:26 PM  Result Value Ref Range Status   Specimen Description BLOOD RIGHT FOREARM  Final   Special Requests   Final    BOTTLES DRAWN AEROBIC AND ANAEROBIC Blood Culture adequate volume   Culture  Setup Time   Final    GRAM NEGATIVE RODS IN BOTH AEROBIC AND ANAEROBIC BOTTLES CRITICAL VALUE NOTED.  VALUE IS CONSISTENT WITH PREVIOUSLY REPORTED AND CALLED VALUE.    Culture GRAM NEGATIVE RODS IDENTIFICATION TO FOLLOW   Final   Report Status PENDING  Incomplete  Culture, blood (Routine x 2)     Status: Abnormal (Preliminary result)   Collection Time: 04/06/17  4:46 PM  Result Value Ref Range Status   Specimen Description BLOOD LEFT WRIST  Final   Special Requests   Final    BOTTLES DRAWN AEROBIC AND ANAEROBIC Blood Culture adequate volume   Culture  Setup Time   Final    GRAM NEGATIVE RODS IN BOTH AEROBIC AND ANAEROBIC BOTTLES CRITICAL RESULT CALLED TO, READ BACK BY AND VERIFIED WITH: A. [facility]STON [facility] 04/07/17 0825 BEAMJ    Culture ESCHERICHIA COLI SUSCEPTIBILITIES TO FOLLOW  (A)  Final   Report Status PENDING  Incomplete  Blood Culture ID Panel (Reflexed)     Status: Abnormal   Collection Time: 04/06/17  4:46 PM  Result Value Ref Range Status   Enterococcus species NOT DETECTED NOT DETECTED Final   Listeria monocytogenes NOT DETECTED NOT DETECTED Final   Staphylococcus species NOT DETECTED NOT DETECTED Final   Staphylococcus aureus NOT DETECTED NOT DETECTED Final   Streptococcus species NOT DETECTED NOT DETECTED Final   Streptococcus agalactiae NOT DETECTED NOT DETECTED Final   Streptococcus pneumoniae  NOT DETECTED NOT DETECTED Final   Streptococcus pyogenes NOT DETECTED NOT DETECTED Final   Acinetobacter baumannii NOT DETECTED NOT DETECTED Final   Enterobacteriaceae species DETECTED (A) NOT DETECTED Final    Comment: Enterobacteriaceae represent a large family of gram-negative bacteria, not a single organism. CRITICAL RESULT CALLED TO, READ BACK BY AND VERIFIED WITH: A. [facility]STON [facility] 04/17/17 0825 BEAMJ    Enterobacter cloacae complex NOT DETECTED NOT DETECTED Final   Escherichia coli DETECTED (A) NOT DETECTED Final    Comment: CRITICAL RESULT CALLED TO, READ BACK BY AND VERIFIED WITH: A. [place]  04/07/17 0825 BEAMJ    Klebsiella oxytoca NOT DETECTED NOT DETECTED Final   Klebsiella pneumoniae NOT DETECTED NOT DETECTED Final   Proteus species NOT DETECTED NOT DETECTED Final   Serratia marcescens NOT DETECTED NOT DETECTED Final   Carbapenem resistance NOT DETECTED NOT DETECTED Final   Haemophilus influenzae NOT DETECTED NOT DETECTED Final   Neisseria meningitidis NOT DETECTED NOT DETECTED Final   Pseudomonas aeruginosa NOT DETECTED NOT DETECTED Final   Candida albicans NOT DETECTED NOT DETECTED Final   Candida glabrata NOT DETECTED NOT DETECTED Final   Candida krusei NOT DETECTED NOT DETECTED Final   Candida parapsilosis NOT DETECTED NOT DETECTED Final   Candida tropicalis NOT DETECTED NOT DETECTED Final         Radiology Studies: Dg Chest 2 View  Result Date: 04/06/2017 CLINICAL DATA:  Pt reports fever today of 102, UTI dx today, some SOB and weakness x 3-4 days; pt reports she takes meds for HTN; non-smoker EXAM: CHEST  2 VIEW COMPARISON:  10/14/2014 FINDINGS: Heart is mildly enlarged but also accentuated by the technique. Shallow lung inflation. There is pulmonary vascular congestion but no overt edema. Focal left lower lobe atelectasis is present. Small bilateral pleural effusions. There is dense atherosclerotic calcification of the thoracic aorta. IMPRESSION: 1.  Cardiomegaly and pulmonary vascular congestion. 2. Left lower lobe atelectasis.  Bilateral pleural effusions. 3.  Aortic atherosclerosis. Electronically Signed   By: Nolon Nations M.D.   On: 04/06/2017 17:12        Scheduled Meds: . amLODipine  2.5 mg Oral Daily  . aspirin EC  81 mg Oral Daily  . enoxaparin (LOVENOX) injection  40 mg Subcutaneous Q24H  . metoprolol succinate  50 mg Oral Daily   Continuous Infusions: . cefTRIAXone (ROCEPHIN)  IV 2 g (04/07/17 1509)     LOS: 2 days    Time spent: 35 minutes.     Hosie Poisson, MD [facility] Pager [phone]  If 7PM-7AM, please contact night-coverage [email or web address] Password [facility] 04/08/2017, 3:44 PM

## 2017-04-08 NOTE — Care Management Note (Addendum)
Case Management Note  Patient Details  Name: AAYRA HORNBAKER MRN: [id number] Date of Birth: 1926-09-05  Subjective/Objective:                  Presentedwith fever and chills associated with flank pain, nausea and vomiting. Found to have sepsis secondary to UTI. PMH includes vertigo, macular degeneration. HTN, gout. Rodney(son) lives with mom.  PCP: Bing Matter  Action/Plan: Plan is to d/c to home when medically stable. CM following for disposition needs.  Expected Discharge Date:  04/08/17               Expected Discharge Plan:  [place]  In-House Referral:     Discharge planning Services  CM Consult  Post Acute Care Choice:    Choice offered to:  Patient  DME Arranged:    DME Agency:     HH Arranged:  PT [place]:  [facility] Referral made with [id number]. CM has requested order from MD.  Status of Service:  In process, will continue to follow  If discussed at Long Length of Stay Meetings, dates discussed:    Additional Comments:  Sharin Mons, RN 04/08/2017, 11:42 AM

## 2017-04-09 ENCOUNTER — Other Ambulatory Visit: Payer: Self-pay

## 2017-04-09 DIAGNOSIS — I10 Essential (primary) hypertension: Secondary | ICD-10-CM

## 2017-04-09 DIAGNOSIS — A419 Sepsis, unspecified organism: Principal | ICD-10-CM

## 2017-04-09 DIAGNOSIS — E876 Hypokalemia: Secondary | ICD-10-CM

## 2017-04-09 DIAGNOSIS — I4891 Unspecified atrial fibrillation: Secondary | ICD-10-CM

## 2017-04-09 DIAGNOSIS — N390 Urinary tract infection, site not specified: Secondary | ICD-10-CM

## 2017-04-09 LAB — BASIC METABOLIC PANEL
Anion gap: 7 (ref 5–15)
BUN: 8 mg/dL (ref 6–20)
CALCIUM: 9 mg/dL (ref 8.9–10.3)
CO2: 23 mmol/L (ref 22–32)
CREATININE: 0.55 mg/dL (ref 0.44–1.00)
Chloride: 105 mmol/L (ref 101–111)
GFR calc non Af Amer: >60 mL/min (ref >60)
GLUCOSE: 100 mg/dL — AB (ref 65–99)
Potassium: 3.4 mmol/L — ABNORMAL LOW (ref 3.5–5.1)
Sodium: 135 mmol/L (ref 135–145)

## 2017-04-09 LAB — TSH: TSH: 2.797 u[IU]/mL (ref 0.350–4.500)

## 2017-04-09 LAB — CULTURE, BLOOD (ROUTINE X 2)
SPECIAL REQUESTS: ADEQUATE
Special Requests: ADEQUATE

## 2017-04-09 LAB — CBC
HEMATOCRIT: 37.2 % (ref 36.0–46.0)
Hemoglobin: 12.5 g/dL (ref 12.0–15.0)
MCH: 31.4 pg (ref 26.0–34.0)
MCHC: 33.6 g/dL (ref 30.0–36.0)
MCV: 93.5 fL (ref 78.0–100.0)
Platelets: 181 10*3/uL (ref 150–400)
RBC: 3.98 MIL/uL (ref 3.87–5.11)
RDW: 14.2 % (ref 11.5–15.5)
WBC: 11.2 10*3/uL — ABNORMAL HIGH (ref 4.0–10.5)

## 2017-04-09 LAB — MAGNESIUM: Magnesium: 1.8 mg/dL (ref 1.7–2.4)

## 2017-04-09 LAB — T4, FREE: Free T4: 1.1 ng/dL (ref 0.61–1.12)

## 2017-04-09 MED ORDER — DILTIAZEM LOAD VIA INFUSION
15.0000 mg | Freq: Once | INTRAVENOUS | Status: AC
  Administered 2017-04-09: 15 mg via INTRAVENOUS
  Filled 2017-04-09: qty 15

## 2017-04-09 MED ORDER — AMIODARONE HCL IN DEXTROSE 360-4.14 MG/200ML-% IV SOLN
30.0000 mg/h | INTRAVENOUS | Status: DC
  Administered 2017-04-09 – 2017-04-10 (×3): 30 mg/h via INTRAVENOUS
  Filled 2017-04-09 (×2): qty 200

## 2017-04-09 MED ORDER — METOPROLOL TARTRATE 5 MG/5ML IV SOLN
2.5000 mg | Freq: Once | INTRAVENOUS | Status: AC
  Administered 2017-04-09: 2.5 mg via INTRAVENOUS
  Filled 2017-04-09: qty 5

## 2017-04-09 MED ORDER — DILTIAZEM HCL 100 MG IV SOLR
5.0000 mg/h | INTRAVENOUS | Status: DC
  Administered 2017-04-09: 10 mg/h via INTRAVENOUS
  Administered 2017-04-09: 5 mg/h via INTRAVENOUS
  Filled 2017-04-09 (×2): qty 100

## 2017-04-09 MED ORDER — AMIODARONE HCL IN DEXTROSE 360-4.14 MG/200ML-% IV SOLN
60.0000 mg/h | INTRAVENOUS | Status: AC
Start: 2017-04-09 — End: 2017-04-09
  Filled 2017-04-09 (×2): qty 200

## 2017-04-09 MED ORDER — HEPARIN BOLUS VIA INFUSION
3000.0000 [IU] | Freq: Once | INTRAVENOUS | Status: DC
  Filled 2017-04-09: qty 3000

## 2017-04-09 MED ORDER — METOPROLOL SUCCINATE ER 100 MG PO TB24
100.0000 mg | ORAL_TABLET | Freq: Every day | ORAL | Status: DC
  Administered 2017-04-10: 100 mg via ORAL
  Filled 2017-04-09: qty 1

## 2017-04-09 MED ORDER — METOPROLOL TARTRATE 50 MG PO TABS
50.0000 mg | ORAL_TABLET | Freq: Once | ORAL | Status: AC
  Administered 2017-04-09: 50 mg via ORAL
  Filled 2017-04-09: qty 1

## 2017-04-09 MED ORDER — HEPARIN (PORCINE) IN NACL 100-0.45 UNIT/ML-% IJ SOLN: 900.0000 [IU]/h | INTRAMUSCULAR | Status: DC

## 2017-04-09 MED ORDER — NYSTATIN 100000 UNIT/ML MT SUSP
5.0000 mL | Freq: Four times a day (QID) | OROMUCOSAL | Status: DC
  Administered 2017-04-09 – 2017-04-12 (×10): 500000 [IU] via ORAL
  Filled 2017-04-09 (×10): qty 5

## 2017-04-09 MED ORDER — AMIODARONE LOAD VIA INFUSION
150.0000 mg | Freq: Once | INTRAVENOUS | Status: AC
  Administered 2017-04-09: 150 mg via INTRAVENOUS
  Filled 2017-04-09: qty 83.34

## 2017-04-09 MED ORDER — POTASSIUM CHLORIDE CRYS ER 20 MEQ PO TBCR
40.0000 meq | EXTENDED_RELEASE_TABLET | Freq: Once | ORAL | Status: AC
  Administered 2017-04-09: 40 meq via ORAL
  Filled 2017-04-09: qty 2

## 2017-04-09 MED ORDER — RIVAROXABAN 20 MG PO TABS
20.0000 mg | ORAL_TABLET | Freq: Every day | ORAL | Status: DC
  Administered 2017-04-09 – 2017-04-11 (×3): 20 mg via ORAL
  Filled 2017-04-09 (×3): qty 1

## 2017-04-09 MED ORDER — MAGNESIUM SULFATE 2 GM/50ML IV SOLN
2.0000 g | Freq: Once | INTRAVENOUS | Status: AC
  Administered 2017-04-09: 2 g via INTRAVENOUS
  Filled 2017-04-09: qty 50

## 2017-04-09 NOTE — Progress Notes (Signed)
Pt HR still in the 120s. Ordered amiodarone IV load to infusion order set. May titrate cardizem drip down as her HR decreases.   Ledora Bottcher, PA-C 04/09/2017, 5:31 PM [place]

## 2017-04-09 NOTE — Progress Notes (Signed)
PROGRESS NOTE    Nancy Hayes  NGE:[id number] DOB: [birth year] DOA: 04/06/2017 PCP: Aletha Halim., PA-C   Chief Complaint  Patient presents with  . Weakness    Brief Narrative:  HPI on 04/06/2017 by Dr. Hosie Poisson Nancy Hayes is a 82 y.o. female with medical history significant of hypertension, arthritis, gout, vertigo, was brought in by her son for fever and chills since last night, associated with flank pain, nausea, vomiting yesterday, . On arrival to ED, she was febrile, tachycardic, . Labs revealed potassium of 2.9, elevated lactic acid. UA IS abnormal.she was referred to medical service for admission for sepsis from UTI. She denies chest pain, sob, palpitations, cough, no sick contacts. No diarrhea or abdominal pain. No dizziness or syncope. She reports weakness and unable to walk from weakness.  Assessment & Plan   New onset Atrial fibrillation With RVR -Patient had new onset atrial fibrillation with heart rate in the 170 to 180s -Was given IV metoprolol without significant change -Will transfer to stepdown, begin Cardizem drip with bolus -will start on heparin -Obtain echocardiogram, magnesium, TSH/FT4 -Cardiology consulted and appreciated -patient was admitted with sepsis however, sepsis appears to be improving  -CHADSVASC 4 (based on age, gender, HTN)  Sepsis secondary to UTI/Ecoli bacteremia -Patient with tachypneic, with leukocytosis on admission -UA: Many bacteria, large leukocytes, TNTC WBC -Urine culture >100k Ecoli -Blood cultures 04/06/17: 2/2 Ecoli -repeat blood cultures 04/08/17: shows no growth to date  Essential hypertension -Continue metoprolol  -norvasc held  Hypokalemia -Potassium 3.4, will replace and continue monitor BMP  DVT Prophylaxis  heparin  Code Status: Full  Family Communication: None at bedside  Disposition Plan: Admitted, will transfer to stepdown.   Consultants Cardiology  Procedures  none  Antibiotics   Anti-infectives      Start     Dose/Rate Route Frequency Ordered Stop   04/07/17 1800  cefTRIAXone (ROCEPHIN) 1 g in dextrose 5 % 50 mL IVPB  Status:  Discontinued     1 g 100 mL/hr over 30 Minutes Intravenous Every 24 hours 04/06/17 1624 04/07/17 0827   04/07/17 1600  cefTRIAXone (ROCEPHIN) 2 g in dextrose 5 % 50 mL IVPB     2 g 100 mL/hr over 30 Minutes Intravenous Every 24 hours 04/07/17 0827     04/06/17 1630  cefTRIAXone (ROCEPHIN) 2 g in dextrose 5 % 50 mL IVPB     2 g 100 mL/hr over 30 Minutes Intravenous  Once 04/06/17 1617 04/06/17 1712      Subjective:   Nancy Hayes seen and examined today.  Patient denies any chest pain, short of breath, abdominal pain, nausea vomiting diarrhea constipation, dizziness or headache, further chills.  Objective:   Vitals:   04/08/17 2053 04/09/17 0626 04/09/17 1034 04/09/17 1142  BP: (!) 167/78 (!) 163/74 (!) 180/65 (!) 144/81  Pulse: 68 62  (!) 156  Resp: 17 17    Temp: 98.1 F (36.7 C) 97.7 F (36.5 C)    TempSrc: Oral Oral    SpO2: 97% 94%  95%  Weight:      Height:        Intake/Output Summary (Last 24 hours) at 04/09/17 1315 Last data filed at 04/09/17 0619  Gross per 24 hour  Intake              220 ml  Output             1500 ml  Net            -  1280 ml   Filed Weights   04/06/17 1606 04/06/17 2005  Weight: 61.2 kg (135 lb) 62.4 kg (137 lb 8 oz)    Exam  General: Well developed, well nourished, NAD, appears stated age  60: NCAT, mucous membranes moist.   Cardiovascular: S1 S2 auscultated, irregularly irregular  Respiratory: Clear to auscultation bilaterally with equal chest rise  Abdomen: Soft, nontender, nondistended, + bowel sounds  Extremities: warm dry without cyanosis clubbing or edema  Neuro: AAOx3, nonfocal   Psych: Normal affect and demeanor with intact judgement and insight   Data Reviewed: I have personally reviewed following labs and imaging studies  CBC:  Recent Labs Lab 04/06/17 1620 04/08/17 0840  04/09/17 0719  WBC 7.9 12.6* 11.2*  NEUTROABS 7.6  --   --   HGB 12.5 12.0 12.5  HCT 38.0 35.5* 37.2  MCV 93.4 92.4 93.5  PLT 143* 169 970   Basic Metabolic Panel:  Recent Labs Lab 04/06/17 1620 04/06/17 2359 04/08/17 0840 04/09/17 0719  NA 136 136 137 135  K 2.9* 3.3* 4.5 3.4*  CL 106 107 107 105  CO2 19* 21* 24 23  GLUCOSE 123* 180* 110* 100*  BUN 19 17 10 8   CREATININE 0.93 0.86 0.64 0.55  CALCIUM 8.4* 8.7* 9.0 9.0  MG  --   --   --  1.8   GFR: Estimated Creatinine Clearance: 45.5 mL/min (by C-G formula based on SCr of 0.55 mg/dL). Liver Function Tests:  Recent Labs Lab 04/06/17 1620  AST 33  ALT 21  ALKPHOS 193*  BILITOT 1.7*  PROT 6.2*  ALBUMIN 2.6*   No results for input(s): LIPASE, AMYLASE in the last 168 hours. No results for input(s): AMMONIA in the last 168 hours. Coagulation Profile:  Recent Labs Lab 04/06/17 1620  INR 1.41   Cardiac Enzymes: No results for input(s): CKTOTAL, CKMB, CKMBINDEX, TROPONINI in the last 168 hours. BNP (last 3 results) No results for input(s): PROBNP in the last 8760 hours. HbA1C: No results for input(s): HGBA1C in the last 72 hours. CBG: No results for input(s): GLUCAP in the last 168 hours. Lipid Profile: No results for input(s): CHOL, HDL, LDLCALC, TRIG, CHOLHDL, LDLDIRECT in the last 72 hours. Thyroid Function Tests: No results for input(s): TSH, T4TOTAL, FREET4, T3FREE, THYROIDAB in the last 72 hours. Anemia Panel: No results for input(s): VITAMINB12, FOLATE, FERRITIN, TIBC, IRON, RETICCTPCT in the last 72 hours. Urine analysis:    Component Value Date/Time   COLORURINE YELLOW 04/06/2017 1632   APPEARANCEUR TURBID (A) 04/06/2017 1632   LABSPEC 1.025 04/06/2017 1632   PHURINE 6.0 04/06/2017 1632   GLUCOSEU NEGATIVE 04/06/2017 1632   HGBUR MODERATE (A) 04/06/2017 1632   BILIRUBINUR NEGATIVE 04/06/2017 1632   KETONESUR NEGATIVE 04/06/2017 1632   PROTEINUR 100 (A) 04/06/2017 1632   UROBILINOGEN 1.0  10/27/2009 0051   NITRITE NEGATIVE 04/06/2017 1632   LEUKOCYTESUR LARGE (A) 04/06/2017 1632   Sepsis Labs: @LABRCNTIP (procalcitonin:4,lacticidven:4)  ) Recent Results (from the past 240 hour(s))  Culture, blood (Routine x 2)     Status: Abnormal   Collection Time: 04/06/17  4:26 PM  Result Value Ref Range Status   Specimen Description BLOOD RIGHT FOREARM  Final   Special Requests   Final    BOTTLES DRAWN AEROBIC AND ANAEROBIC Blood Culture adequate volume   Culture  Setup Time   Final    GRAM NEGATIVE RODS IN BOTH AEROBIC AND ANAEROBIC BOTTLES CRITICAL VALUE NOTED.  VALUE IS CONSISTENT WITH PREVIOUSLY REPORTED AND CALLED  VALUE.    Culture (A)  Final    ESCHERICHIA COLI SUSCEPTIBILITIES PERFORMED ON PREVIOUS CULTURE WITHIN THE LAST 5 DAYS.    Report Status 04/09/2017 FINAL  Final  Culture, Urine     Status: Abnormal (Preliminary result)   Collection Time: 04/06/17  4:32 PM  Result Value Ref Range Status   Specimen Description URINE, RANDOM  Final   Special Requests NONE  Final   Culture >=100,000 COLONIES/mL ESCHERICHIA COLI (A)  Final   Report Status PENDING  Incomplete  Culture, blood (Routine x 2)     Status: Abnormal   Collection Time: 04/06/17  4:46 PM  Result Value Ref Range Status   Specimen Description BLOOD LEFT WRIST  Final   Special Requests   Final    BOTTLES DRAWN AEROBIC AND ANAEROBIC Blood Culture adequate volume   Culture  Setup Time   Final    GRAM NEGATIVE RODS IN BOTH AEROBIC AND ANAEROBIC BOTTLES CRITICAL RESULT CALLED TO, READ BACK BY AND VERIFIED WITH: A. JOHNSTON PHARM 04/07/17 0825 BEAMJ    Culture ESCHERICHIA COLI (A)  Final   Report Status 04/09/2017 FINAL  Final   Organism ID, Bacteria ESCHERICHIA COLI  Final      Susceptibility   Escherichia coli - MIC*    AMPICILLIN <=2 SENSITIVE Sensitive     CEFAZOLIN <=4 SENSITIVE Sensitive     CEFEPIME <=1 SENSITIVE Sensitive     CEFTAZIDIME <=1 SENSITIVE Sensitive     CEFTRIAXONE <=1 SENSITIVE  Sensitive     CIPROFLOXACIN >=4 RESISTANT Resistant     GENTAMICIN <=1 SENSITIVE Sensitive     IMIPENEM <=0.25 SENSITIVE Sensitive     TRIMETH/SULFA <=20 SENSITIVE Sensitive     AMPICILLIN/SULBACTAM <=2 SENSITIVE Sensitive     PIP/TAZO <=4 SENSITIVE Sensitive     Extended ESBL NEGATIVE Sensitive     * ESCHERICHIA COLI  Blood Culture ID Panel (Reflexed)     Status: Abnormal   Collection Time: 04/06/17  4:46 PM  Result Value Ref Range Status   Enterococcus species NOT DETECTED NOT DETECTED Final   Listeria monocytogenes NOT DETECTED NOT DETECTED Final   Staphylococcus species NOT DETECTED NOT DETECTED Final   Staphylococcus aureus NOT DETECTED NOT DETECTED Final   Streptococcus species NOT DETECTED NOT DETECTED Final   Streptococcus agalactiae NOT DETECTED NOT DETECTED Final   Streptococcus pneumoniae NOT DETECTED NOT DETECTED Final   Streptococcus pyogenes NOT DETECTED NOT DETECTED Final   Acinetobacter baumannii NOT DETECTED NOT DETECTED Final   Enterobacteriaceae species DETECTED (A) NOT DETECTED Final    Comment: Enterobacteriaceae represent a large family of gram-negative bacteria, not a single organism. CRITICAL RESULT CALLED TO, READ BACK BY AND VERIFIED WITH: A. JOHNSTON PHARM 04/17/17 0825 BEAMJ    Enterobacter cloacae complex NOT DETECTED NOT DETECTED Final   Escherichia coli DETECTED (A) NOT DETECTED Final    Comment: CRITICAL RESULT CALLED TO, READ BACK BY AND VERIFIED WITH: A. JOHNSTON PHARM 04/07/17 0825 BEAMJ    Klebsiella oxytoca NOT DETECTED NOT DETECTED Final   Klebsiella pneumoniae NOT DETECTED NOT DETECTED Final   Proteus species NOT DETECTED NOT DETECTED Final   Serratia marcescens NOT DETECTED NOT DETECTED Final   Carbapenem resistance NOT DETECTED NOT DETECTED Final   Haemophilus influenzae NOT DETECTED NOT DETECTED Final   Neisseria meningitidis NOT DETECTED NOT DETECTED Final   Pseudomonas aeruginosa NOT DETECTED NOT DETECTED Final   Candida albicans NOT  DETECTED NOT DETECTED Final   Candida glabrata NOT DETECTED NOT DETECTED  Final   Candida krusei NOT DETECTED NOT DETECTED Final   Candida parapsilosis NOT DETECTED NOT DETECTED Final   Candida tropicalis NOT DETECTED NOT DETECTED Final  Culture, blood (Routine X 2) w Reflex to ID Panel     Status: None (Preliminary result)   Collection Time: 04/08/17 11:52 AM  Result Value Ref Range Status   Specimen Description BLOOD RIGHT ANTECUBITAL  Final   Special Requests   Final    BOTTLES DRAWN AEROBIC ONLY Blood Culture results may not be optimal due to an excessive volume of blood received in culture bottles   Culture NO GROWTH < 24 HOURS  Final   Report Status PENDING  Incomplete  Culture, blood (Routine X 2) w Reflex to ID Panel     Status: None (Preliminary result)   Collection Time: 04/08/17 11:57 AM  Result Value Ref Range Status   Specimen Description BLOOD LEFT HAND  Final   Special Requests   Final    BOTTLES DRAWN AEROBIC ONLY Blood Culture results may not be optimal due to an excessive volume of blood received in culture bottles   Culture NO GROWTH < 24 HOURS  Final   Report Status PENDING  Incomplete      Radiology Studies: No results found.   Scheduled Meds: . amLODipine  5 mg Oral Daily  . aspirin EC  81 mg Oral Daily  . heparin  3,000 Units Intravenous Once  . metoprolol succinate  50 mg Oral Daily   Continuous Infusions: . cefTRIAXone (ROCEPHIN)  IV Stopped (04/08/17 2142)  . diltiazem (CARDIZEM) infusion 15 mg/hr (04/09/17 1302)  . heparin       LOS: 3 days   Time Spent in minutes   45 minutes  Teryn Boerema D.O. on 04/09/2017 at 1:15 PM  Between 7am to 7pm - Pager - [phone]  After 7pm go to [email or web address] - password [email or web address]  And look for the night coverage person covering for me after hours  [facility] Office  [phone]

## 2017-04-09 NOTE — Progress Notes (Signed)
ANTICOAGULATION CONSULT NOTE - Initial Consult  Pharmacy Consult for heparin Indication: atrial fibrillation (new SVT 8/8)  No Known Allergies  Patient Measurements: Height: 5\' 6"  (167.6 cm) Weight: 137 lb 8 oz (62.4 kg) IBW/kg (Calculated) : 59.3 Heparin Dosing Weight: 62 kg  Vital Signs: Temp: 97.7 F (36.5 C) (08/08 0626) Temp Source: Oral (08/08 0626) BP: 144/81 (08/08 1142) Pulse Rate: 156 (08/08 1142)  Labs:  Recent Labs  04/06/17 1620 04/06/17 2359 04/08/17 0840 04/09/17 0719  HGB 12.5  --  12.0 12.5  HCT 38.0  --  35.5* 37.2  PLT 143*  --  169 181  LABPROT 17.3*  --   --   --   INR 1.41  --   --   --   CREATININE 0.93 0.86 0.64 0.55    Estimated Creatinine Clearance: 45.5 mL/min (by C-G formula based on SCr of 0.55 mg/dL).   Medical History: Past Medical History:  Diagnosis Date  . Arthritis   . Dizzy spells   . Gout   . Hypertension   . Macular degeneration of both eyes   . Renal disorder   . UTI (lower urinary tract infection)   . Vertigo   . Vision abnormalities     Medications:  Scheduled:  . amLODipine  5 mg Oral Daily  . aspirin EC  81 mg Oral Daily  . enoxaparin (LOVENOX) injection  40 mg Subcutaneous Q24H  . metoprolol succinate  50 mg Oral Daily   Infusions:  . cefTRIAXone (ROCEPHIN)  IV Stopped (04/08/17 2142)  . magnesium sulfate 1 - 4 g bolus IVPB      Assessment: 83 yo F admitted 8/5 with complaints of fever/chills, flank pain, N/V and found to have Ecoli bacteremia.  Pt currently receiving IV antibiotics for bacteremia.    Today noted to be in SVT with HR 170s.  Receiving IV Metoprolol.  Pt was on Lovenox 40mg  SQ q24h for VTE prophylaxis.  Last dose 8/7 PM.  Goal of Therapy:  Heparin level 0.3-0.7 units/ml Monitor platelets by anticoagulation protocol: Yes   Plan:  D/C Lovenox. Heparin 3000 units IV bolus x 1 Heparin infusion at 900 units/hr Heparin level in 8 hours  Heparin level and CBC daily while on  heparin  [employer], Pharm.D., BCPS Clinical Pharmacist Pager: [phone] Clinical phone for 04/09/2017 from 8:30-4:00 is [phone]. After 4pm, please call Main Rx [phone]) for assistance. 04/09/2017 11:53 AM

## 2017-04-09 NOTE — Progress Notes (Signed)
ANTICOAGULATION CONSULT NOTE - Follow Up Consult  Pharmacy Consult for heparin Indication: atrial fibrillation (new SVT 8/8)  No Known Allergies  Patient Measurements: Height: 5\' 6"  (167.6 cm) Weight: 137 lb 8 oz (62.4 kg) IBW/kg (Calculated) : 59.3 Heparin Dosing Weight: 62 kg  Vital Signs: Temp: 98.7 F (37.1 C) (08/08 1142) Temp Source: Oral (08/08 0626) BP: 144/81 (08/08 1142) Pulse Rate: 156 (08/08 1142)  Labs:  Recent Labs  04/06/17 1620 04/06/17 2359 04/08/17 0840 04/09/17 0719  HGB 12.5  --  12.0 12.5  HCT 38.0  --  35.5* 37.2  PLT 143*  --  169 181  LABPROT 17.3*  --   --   --   INR 1.41  --   --   --   CREATININE 0.93 0.86 0.64 0.55    Estimated Creatinine Clearance: 45.5 mL/min (by C-G formula based on SCr of 0.55 mg/dL).   Medical History: Past Medical History:  Diagnosis Date  . Arthritis   . Dizzy spells   . Gout   . Hypertension   . Macular degeneration of both eyes   . Renal disorder   . UTI (lower urinary tract infection)   . Vertigo   . Vision abnormalities     Medications:  Scheduled:  . aspirin EC  81 mg Oral Daily  . [START ON 04/10/2017] metoprolol succinate  100 mg Oral Daily  . metoprolol tartrate  50 mg Oral Once   Infusions:  . cefTRIAXone (ROCEPHIN)  IV Stopped (04/08/17 2142)  . diltiazem (CARDIZEM) infusion 15 mg/hr (04/09/17 1302)    Assessment: 83 yo F with new onset Afib. Pharmacy consulted to transition from IV heparin to rivaroxaban. H/H and Plt wnl   Goal of Therapy:  Heparin level 0.3-0.7 units/ml Monitor platelets by anticoagulation protocol: Yes   Plan:  -Stop IV heparin  -Start rivaroxaban 20 mg daily  -Monitor for s/s of bleeding -Educate patient on rivaroxaban prior to discharge   Nancy Hayes, PharmD., BCPS Clinical Pharmacist Pager [phone]

## 2017-04-09 NOTE — Consult Note (Signed)
Cardiology Consultation:   Patient ID: MADDILYNN ESPERANZA; [id number]; 02-May-1927   Admit date: 04/06/2017 Date of Consult: 04/09/2017  Primary Care Provider: Aletha Halim., PA-C Primary Cardiologist: Dr. Sallyanne Kuster Primary Electrophysiologist:     Patient Profile:   Gladiola JACQUES WILLINGHAM is a 83 y.o. female with a hx of PACs/PVCs and HTN who is being seen today for the evaluation of Afib RVR at the request of Dr. Ree Kida.  History of Present Illness:   Ms. Shores is known to this service and last saw Dr. Sallyanne Kuster in clinic on 10/18/16. At that time, she was still grieving the loss of her husband. Her PACs/PVCs trigeminy were symptomatically controlled on beta blocker. No medication changes were made at that visit.   She presented to [facility] on 04/06/17 with fever, chills, flank pain, nausea, and vomiting. UA was abnormal, lactic acid elevated and she was hypokalemic. She was admitted to Hospitalist service for urosepsis. She was subsequently found to be in Afib RVR. She was started on cardizem drip and cardiology was consulted.   She was transferred from [facility] to [facility] for cardizem drip. At 10 mg/hr, she was still not rate controlled. Increased cardizem drip to 15 mg/hr and will monitor rate. She is tolerating the rhythm well and HDS. She denies chest pain, but reports palpitations and feeling flush. She is not currently on AC at home.   Past Medical History:  Diagnosis Date  . Arthritis   . Dizzy spells   . Gout   . Hypertension   . Macular degeneration of both eyes   . Renal disorder   . UTI (lower urinary tract infection)   . Vertigo   . Vision abnormalities     Past Surgical History:  Procedure Laterality Date  . CATARACT EXTRACTION    . PARTIAL HYSTERECTOMY       Inpatient Medications: Scheduled Meds: . amLODipine  5 mg Oral Daily  . aspirin EC  81 mg Oral Daily  . diltiazem  15 mg Intravenous Once  . heparin  3,000 Units Intravenous Once  . metoprolol succinate  50 mg Oral Daily     Continuous Infusions: . cefTRIAXone (ROCEPHIN)  IV Stopped (04/08/17 2142)  . diltiazem (CARDIZEM) infusion    . heparin    . magnesium sulfate 1 - 4 g bolus IVPB 2 g (04/09/17 1152)   PRN Meds: hydrALAZINE  Allergies:   No Known Allergies  Social History:   Social History   Social History  . Marital status: Married    Spouse name: N/A  . Number of children: N/A  . Years of education: N/A   Occupational History  . Not on file.   Social History Main Topics  . Smoking status: Never Smoker  . Smokeless tobacco: Never Used  . Alcohol use No  . Drug use: No  . Sexual activity: Not on file   Other Topics Concern  . Not on file   Social History Narrative  . No narrative on file    Family History:   Family History  Problem Relation Age of Onset  . Hypertension Mother   . Stroke Sister      ROS:  Please see the history of present illness.  ROS  All other ROS reviewed and negative.     Physical Exam/Data:   Vitals:   04/08/17 2053 04/09/17 0626 04/09/17 1034 04/09/17 1142  BP: (!) 167/78 (!) 163/74 (!) 180/65 (!) 144/81  Pulse: 68 62  (!) 156  Resp: 17  17    Temp: 98.1 F (36.7 C) 97.7 F (36.5 C)    TempSrc: Oral Oral    SpO2: 97% 94%  95%  Weight:      Height:        Intake/Output Summary (Last 24 hours) at 04/09/17 1224 Last data filed at 04/09/17 3662  Gross per 24 hour  Intake              220 ml  Output             1500 ml  Net            -1280 ml   Filed Weights   04/06/17 1606 04/06/17 2005  Weight: 135 lb (61.2 kg) 137 lb 8 oz (62.4 kg)   Body mass index is 22.19 kg/m.  General:  Well nourished, well developed, in no acute distress HEENT: normal Lymph: no adenopathy Neck: no JVD Endocrine:  No thryomegaly Vascular: No carotid bruits; FA pulses 2+ bilaterally without bruits  Cardiac:  Irregular rhythm, irregular rate, no murmur  Lungs:  clear to auscultation bilaterally, no wheezing, rhonchi or rales  Abd: soft, nontender, no  hepatomegaly  Ext: no edema Musculoskeletal:  No deformities, BUE and BLE strength normal and equal Skin: warm and dry  Neuro:  CNs 2-12 intact, no focal abnormalities noted Psych:  Normal affect   EKG:  The EKG was personally reviewed and demonstrates:  Afib RVR Telemetry:  Telemetry was personally reviewed and demonstrates:  Afib in the 150s starting around 1045AM today  Relevant CV Studies:  Echocardiogram: pending   Laboratory Data:  Chemistry Recent Labs Lab 04/06/17 2359 04/08/17 0840 04/09/17 0719  NA 136 137 135  K 3.3* 4.5 3.4*  CL 107 107 105  CO2 21* 24 23  GLUCOSE 180* 110* 100*  BUN 17 10 8   CREATININE 0.86 0.64 0.55  CALCIUM 8.7* 9.0 9.0  GFRNONAA 59* >60 >60  GFRAA >60 >60 >60  ANIONGAP 8 6 7      Recent Labs Lab 04/06/17 1620  PROT 6.2*  ALBUMIN 2.6*  AST 33  ALT 21  ALKPHOS 193*  BILITOT 1.7*   Hematology Recent Labs Lab 04/06/17 1620 04/08/17 0840 04/09/17 0719  WBC 7.9 12.6* 11.2*  RBC 4.07 3.84* 3.98  HGB 12.5 12.0 12.5  HCT 38.0 35.5* 37.2  MCV 93.4 92.4 93.5  MCH 30.7 31.3 31.4  MCHC 32.9 33.8 33.6  RDW 14.1 14.2 14.2  PLT 143* 169 181   Cardiac EnzymesNo results for input(s): TROPONINI in the last 168 hours. No results for input(s): TROPIPOC in the last 168 hours.  BNPNo results for input(s): BNP, PROBNP in the last 168 hours.  DDimer No results for input(s): DDIMER in the last 168 hours.  Radiology/Studies:  Dg Chest 2 View  Result Date: 04/06/2017 CLINICAL DATA:  Pt reports fever today of 102, UTI dx today, some SOB and weakness x 3-4 days; pt reports she takes meds for HTN; non-smoker EXAM: CHEST  2 VIEW COMPARISON:  10/14/2014 FINDINGS: Heart is mildly enlarged but also accentuated by the technique. Shallow lung inflation. There is pulmonary vascular congestion but no overt edema. Focal left lower lobe atelectasis is present. Small bilateral pleural effusions. There is dense atherosclerotic calcification of the thoracic  aorta. IMPRESSION: 1. Cardiomegaly and pulmonary vascular congestion. 2. Left lower lobe atelectasis.  Bilateral pleural effusions. 3.  Aortic atherosclerosis. Electronically Signed   By: Nolon Nations M.D.   On: 04/06/2017 17:12    Assessment  and Plan:   1. New onset atrial fibrillation with RVR - cardizem drip running at 15 mg/hr - continue toprol 50 mg daily - echo pending, TSH pending - Mg 1.8, primary replacing via IV Mg This patients CHA2DS2-VASc Score and unadjusted Ischemic Stroke Rate (% per year) is equal to 4.8 % stroke rate/year from a score of 4 (HTN, age, female) - she is very active at home living with her son and was even doing yard work last summer. She denies recent falls. We recommend anticoagulation. Given her age, will start xarelto 20 mg daily. - this bout of Afib is likely secondary to her urosepsis and may resolve as her infection resolves - plan for rate control for now with anticoagulation, no urgent DCCV needed at this time - if rate is uncontrolled on diltiazem, will initiate amiodarone load   2. HTN - hold home norvasc while we titrate rate-controlling meds   3. Urosepsis - pt doing better today - ABX per primary team - awaiting sensitivities   Signed, Ledora Bottcher, PA  04/09/2017 12:24 PM  I have seen and examined the patient along with Fabian Sharp, PA.  I have reviewed the chart, notes and new data.  I agree with PA's note.  Key new complaints: rapid palpitations, but otherwise no CV complaints. No angina or dyspnea at rest. Key examination changes: irregular rapid rhythm , otherwise normal CV exam Key new findings / data: hypokalemia  PLAN: Start DOAC.  Increase beta blocker - give an additional dose of metoprolol tartrate now. If diltiazem and metoprolol do not suffice for rate control, switch to amiodarone drip. If we are unsuccessful at rate control, may need a DC cardioversion. However, as urosepsis improves, she may spontaneously  convert to normal rhythm.   Sanda Klein, MD, [place] [phone] 04/09/2017, 1:49 PM

## 2017-04-09 NOTE — Progress Notes (Signed)
Received patient @ 12:00. Heparin gtt has not been started per verbal order from P.A. Awaiting new order with possibility of starting Elequis.

## 2017-04-09 NOTE — Progress Notes (Signed)
Patient around 1115 went into SVT, 170's-190's . Had just gotten her OOB to chair. She states she feels flush and can feel her heart beating fast. Dr. Ree Kida notified and orders received. Continuing to treat/monitor.

## 2017-04-09 NOTE — Significant Event (Signed)
Rapid Response Event Note  Overview:  Called to assist with patient with afib RVR needing Cardizem gtt Time Called: 1133 Arrival Time: 1139 Event Type: Cardiac  Initial Focused Assessment:  Alert warm and dry - oriented - denies CP or SOB - resps regular and unlabored - bil BS clear - Afib with RVR on monitor.  Right PIV leaking - d/c'd per staff RN.   BP 144/81 RR 16 O2 sats 96% on RA.     Interventions:  New PIV started left arm - Cardizem 15 mg bolus given - then drip started at 5 mg/hr.  VSS.  Contines to be asx.  HR slowed to 120 with bolus - then up to 150.  Increased Cardizem to 10 mg IV per protocol.  114/69 HR 153.  Transferred to 2C09 - tol well - handoff to [employer].    Plan of Care (if not transferred):  Event Summary: Name of Physician Notified: Dr. Ree Kida at  (pta RRT)    at    Outcome: Transferred (Comment) (2c09)  Event End Time: 1255  Quin Hoop

## 2017-04-10 ENCOUNTER — Inpatient Hospital Stay (HOSPITAL_COMMUNITY): Payer: Medicare Other

## 2017-04-10 DIAGNOSIS — I480 Paroxysmal atrial fibrillation: Secondary | ICD-10-CM

## 2017-04-10 DIAGNOSIS — I361 Nonrheumatic tricuspid (valve) insufficiency: Secondary | ICD-10-CM

## 2017-04-10 DIAGNOSIS — I495 Sick sinus syndrome: Secondary | ICD-10-CM

## 2017-04-10 LAB — URINE CULTURE: Culture: >=100000 — AB

## 2017-04-10 LAB — BASIC METABOLIC PANEL
Anion gap: 8 (ref 5–15)
BUN: 6 mg/dL (ref 6–20)
CALCIUM: 8.8 mg/dL — AB (ref 8.9–10.3)
CO2: 26 mmol/L (ref 22–32)
CREATININE: 0.59 mg/dL (ref 0.44–1.00)
Chloride: 104 mmol/L (ref 101–111)
Glucose, Bld: 122 mg/dL — ABNORMAL HIGH (ref 65–99)
Potassium: 3.7 mmol/L (ref 3.5–5.1)
Sodium: 138 mmol/L (ref 135–145)

## 2017-04-10 LAB — ECHOCARDIOGRAM COMPLETE
HEIGHTINCHES: 66 in
WEIGHTICAEL: 2200 [oz_av]

## 2017-04-10 LAB — CBC
HEMATOCRIT: 36.5 % (ref 36.0–46.0)
Hemoglobin: 12.1 g/dL (ref 12.0–15.0)
MCH: 30.6 pg (ref 26.0–34.0)
MCHC: 33.2 g/dL (ref 30.0–36.0)
MCV: 92.4 fL (ref 78.0–100.0)
PLATELETS: 215 10*3/uL (ref 150–400)
RBC: 3.95 MIL/uL (ref 3.87–5.11)
RDW: 13.9 % (ref 11.5–15.5)
WBC: 13.5 10*3/uL — ABNORMAL HIGH (ref 4.0–10.5)

## 2017-04-10 LAB — MAGNESIUM: Magnesium: 2.1 mg/dL (ref 1.7–2.4)

## 2017-04-10 MED ORDER — AMOXICILLIN 500 MG PO CAPS
500.0000 mg | ORAL_CAPSULE | Freq: Three times a day (TID) | ORAL | Status: DC
  Administered 2017-04-10 – 2017-04-11 (×3): 500 mg via ORAL
  Filled 2017-04-10 (×3): qty 1

## 2017-04-10 MED ORDER — OXYCODONE-ACETAMINOPHEN 5-325 MG PO TABS
1.0000 | ORAL_TABLET | Freq: Once | ORAL | Status: AC
  Administered 2017-04-10: 1 via ORAL
  Filled 2017-04-10: qty 1

## 2017-04-10 MED ORDER — ACETAMINOPHEN 325 MG PO TABS
650.0000 mg | ORAL_TABLET | Freq: Four times a day (QID) | ORAL | Status: DC | PRN
  Administered 2017-04-11 – 2017-04-12 (×3): 650 mg via ORAL
  Filled 2017-04-10 (×3): qty 2

## 2017-04-10 MED ORDER — AMLODIPINE BESYLATE 2.5 MG PO TABS
2.5000 mg | ORAL_TABLET | Freq: Every day | ORAL | Status: DC
  Administered 2017-04-11 – 2017-04-12 (×2): 2.5 mg via ORAL
  Filled 2017-04-10 (×2): qty 1

## 2017-04-10 MED ORDER — METOPROLOL SUCCINATE ER 50 MG PO TB24
50.0000 mg | ORAL_TABLET | Freq: Every day | ORAL | Status: DC
Start: 2017-04-11 — End: 2017-04-12
  Administered 2017-04-11 – 2017-04-12 (×2): 50 mg via ORAL
  Filled 2017-04-10 (×2): qty 1

## 2017-04-10 MED ORDER — SODIUM CHLORIDE 0.9 % IV BOLUS (SEPSIS)
500.0000 mL | Freq: Once | INTRAVENOUS | Status: AC
  Administered 2017-04-10: 500 mL via INTRAVENOUS

## 2017-04-10 MED ORDER — POTASSIUM CHLORIDE CRYS ER 20 MEQ PO TBCR
40.0000 meq | EXTENDED_RELEASE_TABLET | Freq: Once | ORAL | Status: AC
  Administered 2017-04-10: 40 meq via ORAL
  Filled 2017-04-10: qty 2

## 2017-04-10 NOTE — Discharge Instructions (Signed)

## 2017-04-10 NOTE — Progress Notes (Signed)
  Echocardiogram 2D Echocardiogram has been performed.  Nancy Hayes 04/10/2017, 4:03 PM

## 2017-04-10 NOTE — Progress Notes (Signed)
Progress Note  Patient Name: Nancy Hayes Date of Encounter: 04/10/2017  Primary Cardiologist: Bonney Berres  Subjective   Feeling better. Converted to SR x 3 times (2150, 2230 with rapid recurrence and finally around midnight), each time with a fairly impressive sinus pause of up to 7.1 seconds. At least once felt lightheaded, but no syncope. K almost back to norma. Otherwise she feels great and asks about returning home.  Inpatient Medications    Scheduled Meds: . amoxicillin  500 mg Oral Q8H  . aspirin EC  81 mg Oral Daily  . metoprolol succinate  100 mg Oral Daily  . nystatin  5 mL Oral QID  . rivaroxaban  20 mg Oral Q supper   Continuous Infusions: . amiodarone 30 mg/hr (04/10/17 1400)  . diltiazem (CARDIZEM) infusion 10 mg/hr (04/09/17 2126)   PRN Meds: hydrALAZINE   Vital Signs    Vitals:   04/09/17 2341 04/10/17 0300 04/10/17 0808 04/10/17 1200  BP: (!) 155/67 (!) 171/64 (!) 164/94 (!) 166/61  Pulse: (!) 52 (!) 59 62 (!) 56  Resp: (!) 33 (!) 21 (!) 27 (!) 26  Temp: 97.9 F (36.6 C) 97.6 F (36.4 C) 97.9 F (36.6 C) 98.4 F (36.9 C)  TempSrc: Oral Oral Oral Oral  SpO2: 95% 96% 99% 95%  Weight:      Height:        Intake/Output Summary (Last 24 hours) at 04/10/17 1529 Last data filed at 04/10/17 1300  Gross per 24 hour  Intake          1287.09 ml  Output              800 ml  Net           487.09 ml   Filed Weights   04/06/17 1606 04/06/17 2005  Weight: 135 lb (61.2 kg) 137 lb 8 oz (62.4 kg)    Telemetry    NSR 60s. Initially converted from AF to SR with a 5" pause, then AF recurred and second time converted with a  7" pause - Personally Reviewed  ECG    No new tracing - Personally Reviewed  Physical Exam  Calm relaxed, getting echo, lying completely flat in bed. GEN: No acute distress.   Neck: No JVD Cardiac: RRR, no murmurs, rubs, or gallops.  Respiratory: Clear to auscultation bilaterally. GI: Soft, nontender, non-distended  MS: No  edema; No deformity. Neuro:  Nonfocal  Psych: Normal affect   Labs    Chemistry Recent Labs Lab 04/06/17 1620  04/08/17 0840 04/09/17 0719 04/10/17 0318  NA 136  < > 137 135 138  K 2.9*  < > 4.5 3.4* 3.7  CL 106  < > 107 105 104  CO2 19*  < > 24 23 26   GLUCOSE 123*  < > 110* 100* 122*  BUN 19  < > 10 8 6   CREATININE 0.93  < > 0.64 0.55 0.59  CALCIUM 8.4*  < > 9.0 9.0 8.8*  PROT 6.2*  --   --   --   --   ALBUMIN 2.6*  --   --   --   --   AST 33  --   --   --   --   ALT 21  --   --   --   --   ALKPHOS 193*  --   --   --   --   BILITOT 1.7*  --   --   --   --  GFRNONAA 53*  < > >60 >60 >60  GFRAA >60  < > >60 >60 >60  ANIONGAP 11  < > 6 7 8   < > = values in this interval not displayed.   Hematology Recent Labs Lab 04/08/17 0840 04/09/17 0719 04/10/17 0318  WBC 12.6* 11.2* 13.5*  RBC 3.84* 3.98 3.95  HGB 12.0 12.5 12.1  HCT 35.5* 37.2 36.5  MCV 92.4 93.5 92.4  MCH 31.3 31.4 30.6  MCHC 33.8 33.6 33.2  RDW 14.2 14.2 13.9  PLT 169 181 215    Radiology    No results found.  Cardiac Studies   Preliminary review of bedside echo shows a dilated LA, normal LV function and relatively mild degenerative valvular abnormalities. Full review to follow.  Patient Profile     83 y.o. female with newly diagnosed paroxysmal atrial fibrillation with extreme RVR during urosepsis and hypokalemia, very difficult to rate control, but converted to NSR on iv amiodarone with severe post-conversion pauses  Assessment & Plan    1. PAF: rate control was a big challenge, thankfully back in SR. On Xarelto. 2. Tachy-brady sd: stop amiodarone and reduce the beta blocker dose to her previous dose. No indication for pacemaker at this time, unless atrial fibrillation becomes prevalent and we need to use antiarrhythmics. 3. HTN: restart amlodipine.  Signed, Sanda Klein, MD  04/10/2017, 3:29 PM

## 2017-04-10 NOTE — Progress Notes (Signed)
Late Entry for AM    04/10/17 1149  PT Visit Information  Last PT Received On 04/10/17  Assistance Needed +1  History of Present Illness Patient is a 83 y/o female who presents with fever and chills associated with flank pain, nausea and vomiting. Found to have sepsis secondary to UTI. PMH includes vertigo, macular degeneration. HTN, gout.   Subjective Data  Patient Stated Goal "to get out of here"  Pain Assessment  Pain Assessment No/denies pain  Cognition  Arousal/Alertness Awake/alert  Behavior During Therapy [facility] for tasks assessed/performed  Overall Cognitive Status Within Functional Limits for tasks assessed  Bed Mobility  Overal bed mobility Needs Assistance  General bed mobility comments pt OOB in chair upon arrival  Transfers  Overall transfer level Needs assistance  Equipment used Rolling walker (2 wheeled)  Transfers Sit to/from Stand  Sit to Stand Min guard  General transfer comment cues for hand placement to stand and sit  Ambulation/Gait  Ambulation/Gait assistance Min guard  Ambulation Distance (Feet) 130 Feet  Assistive device Rolling walker (2 wheeled)  Gait Pattern/deviations Step-through pattern  General Gait Details mildly unsteady, cues for posture and staying up in the RW., sats maintained at 92/93 on RA with EHR climbing slowly through 80's to max 95 bpm.  Gait velocity decreased  Balance  Overall balance assessment Needs assistance  Sitting-balance support Feet supported;No upper extremity supported  Sitting balance-Leahy Scale Fair  Standing balance support During functional activity  Standing balance-Leahy Scale Fair  PT - End of Session  Activity Tolerance Patient tolerated treatment well  Patient left in chair;with call bell/phone within reach;with family/visitor present  Nurse Communication Mobility status  PT - Assessment/Plan  PT Plan Current plan remains appropriate  PT Visit Diagnosis Unsteadiness on feet (R26.81);Other abnormalities of gait  and mobility (R26.89)  PT Frequency (ACUTE ONLY) Min 3X/week  Follow Up Recommendations Home health PT;Supervision for mobility/OOB  PT equipment None recommended by PT  AM-PAC PT "6 Clicks" Daily Activity Outcome Measure  Difficulty turning over in bed (including adjusting bedclothes, sheets and blankets)? 4  Difficulty moving from lying on back to sitting on the side of the bed?  4  Difficulty sitting down on and standing up from a chair with arms (e.g., wheelchair, bedside commode, etc,.)? 3  Help needed moving to and from a bed to chair (including a wheelchair)? 3  Help needed walking in hospital room? 3  Help needed climbing 3-5 steps with a railing?  3  6 Click Score 20  Mobility G Code  CJ  PT Goal Progression  Progress towards PT goals Progressing toward goals  Acute Rehab PT Goals  PT Goal Formulation With patient  Time For Goal Achievement 04/21/17  Potential to Achieve Goals Fair  PT Time Calculation  PT Start Time (ACUTE ONLY) 1111  PT Stop Time (ACUTE ONLY) 1138  PT Time Calculation (min) (ACUTE ONLY) 27 min  PT General Charges  $$ ACUTE PT VISIT 1 Visit  PT Treatments  $Gait Training 8-22 mins  $Therapeutic Activity 8-22 mins   04/10/2017  Donnella Sham, PT [phone]  (pager)

## 2017-04-10 NOTE — Progress Notes (Signed)
Pt had some pauses.Cards Master on call notified. Stated if happened again to stop cardizam. Pt converted to sinus rhythm and now is in the 50's. Cardizam stopped. Hal Neer Rn BSN MSN. 04/09/2017  @22 .30

## 2017-04-10 NOTE — Progress Notes (Signed)
PROGRESS NOTE    Nancy Hayes  WNU:[id number] DOB: November 17, 1926 DOA: 04/06/2017 PCP: Aletha Halim., PA-C   Chief Complaint  Patient presents with  . Weakness    Brief Narrative:  HPI on 04/06/2017 by Dr. Hosie Poisson Johnathon Nancy Hayes is a 83 y.o. female with medical history significant of hypertension, arthritis, gout, vertigo, was brought in by her son for fever and chills since last night, associated with flank pain, nausea, vomiting yesterday, . On arrival to ED, she was febrile, tachycardic, . Labs revealed potassium of 2.9, elevated lactic acid. UA IS abnormal.she was referred to medical service for admission for sepsis from UTI. She denies chest pain, sob, palpitations, cough, no sick contacts. No diarrhea or abdominal pain. No dizziness or syncope. She reports weakness and unable to walk from weakness.   Interim history Developed Afib RVR.   Assessment & Plan   New onset Atrial fibrillation With RVR -Patient had new onset atrial fibrillation with heart rate in the 170 to 180s on 04/09/2017 -Was started on IV heparin and cardizem drip, and given IV metoprolol -Potassium 3.7, will continue to replace -magnesium 2.1 -TSH 2.797, FT4 1.1 (WNL) -Pending echocardiogram -Cardiology consulted and appreciated, and transitioned patient to Xarelto, and started patient on amiodarone drip as she began to have pauses  -patient was admitted with sepsis however, sepsis appears to be improving  -CHADSVASC 4 (based on age, gender, HTN) -Patient converted to sinus rhythm on 8/8, in the evening.   Sepsis secondary to UTI/Ecoli bacteremia -Patient with tachypneic, with leukocytosis on admission -UA: Many bacteria, large leukocytes, TNTC WBC -Urine culture >100k Ecoli -Blood cultures 04/06/17: 2/2 Ecoli -repeat blood cultures 04/08/17: shows no growth to date -Currently on ceftriaxon, can transition to Vantin 400mg  BID upon discharge  Essential hypertension -Continue metoprolol  -norvasc  held  Hypokalemia -Potassium 3.7, will replace and continue monitor BMP   DVT Prophylaxis  xarelto   Code Status: Full  Family Communication: None at bedside  Disposition Plan: Admitted, Continue to monitor in stepdown, currently on amiodarone drip  Consultants Cardiology  Procedures  none  Antibiotics   Anti-infectives    Start     Dose/Rate Route Frequency Ordered Stop   04/07/17 1800  cefTRIAXone (ROCEPHIN) 1 g in dextrose 5 % 50 mL IVPB  Status:  Discontinued     1 g 100 mL/hr over 30 Minutes Intravenous Every 24 hours 04/06/17 1624 04/07/17 0827   04/07/17 1600  cefTRIAXone (ROCEPHIN) 2 g in dextrose 5 % 50 mL IVPB     2 g 100 mL/hr over 30 Minutes Intravenous Every 24 hours 04/07/17 0827     04/06/17 1630  cefTRIAXone (ROCEPHIN) 2 g in dextrose 5 % 50 mL IVPB     2 g 100 mL/hr over 30 Minutes Intravenous  Once 04/06/17 1617 04/06/17 1712      Subjective:   Dhruti Resende seen and examined today.  Feeling better this morning. Denies chest pain, palpitations, shortness of breath, abdominal pain, N/V/D/C.   Objective:   Vitals:   04/09/17 2040 04/09/17 2341 04/10/17 0300 04/10/17 0808  BP: 121/82 (!) 155/67 (!) 171/64 (!) 164/94  Pulse: (!) 129 (!) 52 (!) 59 62  Resp: (!) 23 (!) 33 (!) 21 (!) 27  Temp: 97.8 F (36.6 C) 97.9 F (36.6 C) 97.6 F (36.4 C) 97.9 F (36.6 C)  TempSrc: Oral Oral Oral Oral  SpO2: 95% 95% 96% 99%  Weight:      Height:  Intake/Output Summary (Last 24 hours) at 04/10/17 1138 Last data filed at 04/10/17 1000  Gross per 24 hour  Intake          1345.59 ml  Output              800 ml  Net           545.59 ml   Filed Weights   04/06/17 1606 04/06/17 2005  Weight: 61.2 kg (135 lb) 62.4 kg (137 lb 8 oz)   Exam  General: Well developed, well nourished, NAD, appears stated age  21: NCAT, mucous membranes moist.   Cardiovascular: S1 S2 auscultated, no rubs, murmurs or gallops. Regular rate and rhythm.  Respiratory:  Clear to auscultation bilaterally with equal chest rise  Abdomen: Soft, nontender, nondistended, + bowel sounds  Extremities: warm dry without cyanosis clubbing or edema  Neuro: AAOx3, nonfocal   Psych: Appropriate mood and affect, pleasant  Data Reviewed: I have personally reviewed following labs and imaging studies  CBC:  Recent Labs Lab 04/06/17 1620 04/08/17 0840 04/09/17 0719 04/10/17 0318  WBC 7.9 12.6* 11.2* 13.5*  NEUTROABS 7.6  --   --   --   HGB 12.5 12.0 12.5 12.1  HCT 38.0 35.5* 37.2 36.5  MCV 93.4 92.4 93.5 92.4  PLT 143* 169 181 371   Basic Metabolic Panel:  Recent Labs Lab 04/06/17 1620 04/06/17 2359 04/08/17 0840 04/09/17 0719 04/10/17 0318  NA 136 136 137 135 138  K 2.9* 3.3* 4.5 3.4* 3.7  CL 106 107 107 105 104  CO2 19* 21* 24 23 26   GLUCOSE 123* 180* 110* 100* 122*  BUN 19 17 10 8 6   CREATININE 0.93 0.86 0.64 0.55 0.59  CALCIUM 8.4* 8.7* 9.0 9.0 8.8*  MG  --   --   --  1.8 2.1   GFR: Estimated Creatinine Clearance: 45.5 mL/min (by C-G formula based on SCr of 0.59 mg/dL). Liver Function Tests:  Recent Labs Lab 04/06/17 1620  AST 33  ALT 21  ALKPHOS 193*  BILITOT 1.7*  PROT 6.2*  ALBUMIN 2.6*   No results for input(s): LIPASE, AMYLASE in the last 168 hours. No results for input(s): AMMONIA in the last 168 hours. Coagulation Profile:  Recent Labs Lab 04/06/17 1620  INR 1.41   Cardiac Enzymes: No results for input(s): CKTOTAL, CKMB, CKMBINDEX, TROPONINI in the last 168 hours. BNP (last 3 results) No results for input(s): PROBNP in the last 8760 hours. HbA1C: No results for input(s): HGBA1C in the last 72 hours. CBG: No results for input(s): GLUCAP in the last 168 hours. Lipid Profile: No results for input(s): CHOL, HDL, LDLCALC, TRIG, CHOLHDL, LDLDIRECT in the last 72 hours. Thyroid Function Tests:  Recent Labs  04/09/17 1208  TSH 2.797  FREET4 1.10   Anemia Panel: No results for input(s): VITAMINB12, FOLATE,  FERRITIN, TIBC, IRON, RETICCTPCT in the last 72 hours. Urine analysis:    Component Value Date/Time   COLORURINE YELLOW 04/06/2017 1632   APPEARANCEUR TURBID (A) 04/06/2017 1632   LABSPEC 1.025 04/06/2017 1632   PHURINE 6.0 04/06/2017 1632   GLUCOSEU NEGATIVE 04/06/2017 1632   HGBUR MODERATE (A) 04/06/2017 1632   BILIRUBINUR NEGATIVE 04/06/2017 1632   KETONESUR NEGATIVE 04/06/2017 1632   PROTEINUR 100 (A) 04/06/2017 1632   UROBILINOGEN 1.0 10/27/2009 0051   NITRITE NEGATIVE 04/06/2017 1632   LEUKOCYTESUR LARGE (A) 04/06/2017 1632   Sepsis Labs: @LABRCNTIP (procalcitonin:4,lacticidven:4)  ) Recent Results (from the past 240 hour(s))  Culture, blood (Routine  x 2)     Status: Abnormal   Collection Time: 04/06/17  4:26 PM  Result Value Ref Range Status   Specimen Description BLOOD RIGHT FOREARM  Final   Special Requests   Final    BOTTLES DRAWN AEROBIC AND ANAEROBIC Blood Culture adequate volume   Culture  Setup Time   Final    GRAM NEGATIVE RODS IN BOTH AEROBIC AND ANAEROBIC BOTTLES CRITICAL VALUE NOTED.  VALUE IS CONSISTENT WITH PREVIOUSLY REPORTED AND CALLED VALUE.    Culture (A)  Final    ESCHERICHIA COLI SUSCEPTIBILITIES PERFORMED ON PREVIOUS CULTURE WITHIN THE LAST 5 DAYS.    Report Status 04/09/2017 FINAL  Final  Culture, Urine     Status: Abnormal   Collection Time: 04/06/17  4:32 PM  Result Value Ref Range Status   Specimen Description URINE, RANDOM  Final   Special Requests NONE  Final   Culture >=100,000 COLONIES/mL ESCHERICHIA COLI (A)  Final   Report Status 04/10/2017 FINAL  Final   Organism ID, Bacteria ESCHERICHIA COLI (A)  Final      Susceptibility   Escherichia coli - MIC*    AMPICILLIN <=2 SENSITIVE Sensitive     CEFAZOLIN <=4 SENSITIVE Sensitive     CEFTRIAXONE <=1 SENSITIVE Sensitive     CIPROFLOXACIN >=4 RESISTANT Resistant     GENTAMICIN <=1 SENSITIVE Sensitive     IMIPENEM <=0.25 SENSITIVE Sensitive     NITROFURANTOIN <=16 SENSITIVE Sensitive      TRIMETH/SULFA <=20 SENSITIVE Sensitive     AMPICILLIN/SULBACTAM <=2 SENSITIVE Sensitive     PIP/TAZO <=4 SENSITIVE Sensitive     Extended ESBL NEGATIVE Sensitive     * >=100,000 COLONIES/mL ESCHERICHIA COLI  Culture, blood (Routine x 2)     Status: Abnormal   Collection Time: 04/06/17  4:46 PM  Result Value Ref Range Status   Specimen Description BLOOD LEFT WRIST  Final   Special Requests   Final    BOTTLES DRAWN AEROBIC AND ANAEROBIC Blood Culture adequate volume   Culture  Setup Time   Final    GRAM NEGATIVE RODS IN BOTH AEROBIC AND ANAEROBIC BOTTLES CRITICAL RESULT CALLED TO, READ BACK BY AND VERIFIED WITH: A. JOHNSTON PHARM 04/07/17 0825 BEAMJ    Culture ESCHERICHIA COLI (A)  Final   Report Status 04/09/2017 FINAL  Final   Organism ID, Bacteria ESCHERICHIA COLI  Final      Susceptibility   Escherichia coli - MIC*    AMPICILLIN <=2 SENSITIVE Sensitive     CEFAZOLIN <=4 SENSITIVE Sensitive     CEFEPIME <=1 SENSITIVE Sensitive     CEFTAZIDIME <=1 SENSITIVE Sensitive     CEFTRIAXONE <=1 SENSITIVE Sensitive     CIPROFLOXACIN >=4 RESISTANT Resistant     GENTAMICIN <=1 SENSITIVE Sensitive     IMIPENEM <=0.25 SENSITIVE Sensitive     TRIMETH/SULFA <=20 SENSITIVE Sensitive     AMPICILLIN/SULBACTAM <=2 SENSITIVE Sensitive     PIP/TAZO <=4 SENSITIVE Sensitive     Extended ESBL NEGATIVE Sensitive     * ESCHERICHIA COLI  Blood Culture ID Panel (Reflexed)     Status: Abnormal   Collection Time: 04/06/17  4:46 PM  Result Value Ref Range Status   Enterococcus species NOT DETECTED NOT DETECTED Final   Listeria monocytogenes NOT DETECTED NOT DETECTED Final   Staphylococcus species NOT DETECTED NOT DETECTED Final   Staphylococcus aureus NOT DETECTED NOT DETECTED Final   Streptococcus species NOT DETECTED NOT DETECTED Final   Streptococcus agalactiae NOT DETECTED NOT DETECTED  Final   Streptococcus pneumoniae NOT DETECTED NOT DETECTED Final   Streptococcus pyogenes NOT DETECTED NOT  DETECTED Final   Acinetobacter baumannii NOT DETECTED NOT DETECTED Final   Enterobacteriaceae species DETECTED (A) NOT DETECTED Final    Comment: Enterobacteriaceae represent a large family of gram-negative bacteria, not a single organism. CRITICAL RESULT CALLED TO, READ BACK BY AND VERIFIED WITH: A. JOHNSTON PHARM 04/17/17 0825 BEAMJ    Enterobacter cloacae complex NOT DETECTED NOT DETECTED Final   Escherichia coli DETECTED (A) NOT DETECTED Final    Comment: CRITICAL RESULT CALLED TO, READ BACK BY AND VERIFIED WITH: A. JOHNSTON PHARM 04/07/17 0825 BEAMJ    Klebsiella oxytoca NOT DETECTED NOT DETECTED Final   Klebsiella pneumoniae NOT DETECTED NOT DETECTED Final   Proteus species NOT DETECTED NOT DETECTED Final   Serratia marcescens NOT DETECTED NOT DETECTED Final   Carbapenem resistance NOT DETECTED NOT DETECTED Final   Haemophilus influenzae NOT DETECTED NOT DETECTED Final   Neisseria meningitidis NOT DETECTED NOT DETECTED Final   Pseudomonas aeruginosa NOT DETECTED NOT DETECTED Final   Candida albicans NOT DETECTED NOT DETECTED Final   Candida glabrata NOT DETECTED NOT DETECTED Final   Candida krusei NOT DETECTED NOT DETECTED Final   Candida parapsilosis NOT DETECTED NOT DETECTED Final   Candida tropicalis NOT DETECTED NOT DETECTED Final  Culture, blood (Routine X 2) w Reflex to ID Panel     Status: None (Preliminary result)   Collection Time: 04/08/17 11:52 AM  Result Value Ref Range Status   Specimen Description BLOOD RIGHT ANTECUBITAL  Final   Special Requests   Final    BOTTLES DRAWN AEROBIC ONLY Blood Culture results may not be optimal due to an excessive volume of blood received in culture bottles   Culture NO GROWTH 1 DAY  Final   Report Status PENDING  Incomplete  Culture, blood (Routine X 2) w Reflex to ID Panel     Status: None (Preliminary result)   Collection Time: 04/08/17 11:57 AM  Result Value Ref Range Status   Specimen Description BLOOD LEFT HAND  Final    Special Requests   Final    BOTTLES DRAWN AEROBIC ONLY Blood Culture results may not be optimal due to an excessive volume of blood received in culture bottles   Culture NO GROWTH 1 DAY  Final   Report Status PENDING  Incomplete      Radiology Studies: No results found.   Scheduled Meds: . aspirin EC  81 mg Oral Daily  . metoprolol succinate  100 mg Oral Daily  . nystatin  5 mL Oral QID  . rivaroxaban  20 mg Oral Q supper   Continuous Infusions: . amiodarone 30 mg/hr (04/10/17 1100)  . cefTRIAXone (ROCEPHIN)  IV 2 g (04/09/17 1600)  . diltiazem (CARDIZEM) infusion 10 mg/hr (04/09/17 2126)     LOS: 4 days   Time Spent in minutes   45 minutes  Kristell Wooding D.O. on 04/10/2017 at 11:38 AM  Between 7am to 7pm - Pager - [phone]  After 7pm go to [email or web address] - password [email or web address]  And look for the night coverage person covering for me after hours  [facility] Office  [phone]

## 2017-04-10 NOTE — Progress Notes (Signed)
Pure wick removed by PT for ambulation and refused to place it back. Instructed to call once she needs to urinate.

## 2017-04-11 ENCOUNTER — Inpatient Hospital Stay (HOSPITAL_COMMUNITY): Payer: Medicare Other

## 2017-04-11 ENCOUNTER — Telehealth: Payer: Self-pay | Admitting: Cardiovascular Disease

## 2017-04-11 DIAGNOSIS — A4151 Sepsis due to Escherichia coli [E. coli]: Secondary | ICD-10-CM

## 2017-04-11 DIAGNOSIS — I480 Paroxysmal atrial fibrillation: Secondary | ICD-10-CM

## 2017-04-11 DIAGNOSIS — D72829 Elevated white blood cell count, unspecified: Secondary | ICD-10-CM

## 2017-04-11 DIAGNOSIS — I495 Sick sinus syndrome: Secondary | ICD-10-CM

## 2017-04-11 LAB — MRSA PCR SCREENING: MRSA by PCR: NEGATIVE

## 2017-04-11 LAB — CBC
HCT: 36.9 % (ref 36.0–46.0)
Hemoglobin: 12.4 g/dL (ref 12.0–15.0)
MCH: 31.2 pg (ref 26.0–34.0)
MCHC: 33.6 g/dL (ref 30.0–36.0)
MCV: 92.7 fL (ref 78.0–100.0)
Platelets: 201 10*3/uL (ref 150–400)
RBC: 3.98 MIL/uL (ref 3.87–5.11)
RDW: 14.1 % (ref 11.5–15.5)
WBC: 16.9 10*3/uL — ABNORMAL HIGH (ref 4.0–10.5)

## 2017-04-11 LAB — BASIC METABOLIC PANEL
Anion gap: 8 (ref 5–15)
BUN: 7 mg/dL (ref 6–20)
CO2: 22 mmol/L (ref 22–32)
Calcium: 9.1 mg/dL (ref 8.9–10.3)
Chloride: 105 mmol/L (ref 101–111)
Creatinine, Ser: 0.6 mg/dL (ref 0.44–1.00)
GFR calc Af Amer: >60 mL/min (ref >60)
GFR calc non Af Amer: >60 mL/min (ref >60)
Glucose, Bld: 95 mg/dL (ref 65–99)
Potassium: 3.3 mmol/L — ABNORMAL LOW (ref 3.5–5.1)
Sodium: 135 mmol/L (ref 135–145)

## 2017-04-11 LAB — URINALYSIS, ROUTINE W REFLEX MICROSCOPIC
Bilirubin Urine: NEGATIVE
Glucose, UA: NEGATIVE mg/dL
Ketones, ur: NEGATIVE mg/dL
Nitrite: NEGATIVE
Protein, ur: NEGATIVE mg/dL
Specific Gravity, Urine: 1.015 (ref 1.005–1.030)
pH: 6 (ref 5.0–8.0)

## 2017-04-11 LAB — MAGNESIUM: Magnesium: 1.6 mg/dL — ABNORMAL LOW (ref 1.7–2.4)

## 2017-04-11 MED ORDER — DEXTROSE 5 % IV SOLN
2.0000 g | INTRAVENOUS | Status: DC
  Administered 2017-04-11 – 2017-04-12 (×2): 2 g via INTRAVENOUS
  Filled 2017-04-11 (×2): qty 2

## 2017-04-11 MED ORDER — POTASSIUM CHLORIDE CRYS ER 20 MEQ PO TBCR
40.0000 meq | EXTENDED_RELEASE_TABLET | Freq: Once | ORAL | Status: AC
  Administered 2017-04-11: 40 meq via ORAL
  Filled 2017-04-11: qty 2

## 2017-04-11 MED ORDER — POTASSIUM CHLORIDE 20 MEQ/15ML (10%) PO SOLN
40.0000 meq | Freq: Every day | ORAL | Status: DC
  Administered 2017-04-11 – 2017-04-12 (×2): 40 meq via ORAL
  Filled 2017-04-11 (×2): qty 30

## 2017-04-11 MED ORDER — MAGNESIUM SULFATE 2 GM/50ML IV SOLN
2.0000 g | Freq: Once | INTRAVENOUS | Status: AC
  Administered 2017-04-11: 2 g via INTRAVENOUS
  Filled 2017-04-11: qty 50

## 2017-04-11 NOTE — Telephone Encounter (Signed)
Closed Encounter  °

## 2017-04-11 NOTE — Progress Notes (Signed)
Progress Note  Patient Name: Nancy Hayes Date of Encounter: 04/11/2017  Primary Cardiologist: Dr. Sallyanne Kuster  Subjective   Pt feels well. Had leg cramps last evening, likely secondary to low K and low Mg.  Inpatient Medications    Scheduled Meds: . amLODipine  2.5 mg Oral Daily  . aspirin EC  81 mg Oral Daily  . metoprolol succinate  50 mg Oral Daily  . nystatin  5 mL Oral QID  . potassium chloride  40 mEq Oral Daily  . rivaroxaban  20 mg Oral Q supper   Continuous Infusions: . cefTRIAXone (ROCEPHIN)  IV Stopped (04/11/17 1021)   PRN Meds: acetaminophen, hydrALAZINE   Vital Signs    Vitals:   04/11/17 0700 04/11/17 0944 04/11/17 1153 04/11/17 1200  BP:  (!) 149/70    Pulse:   68   Resp:    19  Temp: 97.9 F (36.6 C)  (!) 97.5 F (36.4 C)   TempSrc: Oral  Oral   SpO2:   98%   Weight:      Height:        Intake/Output Summary (Last 24 hours) at 04/11/17 1348 Last data filed at 04/11/17 0500  Gross per 24 hour  Intake              500 ml  Output              700 ml  Net             -200 ml   Filed Weights   04/06/17 1606 04/06/17 2005  Weight: 135 lb (61.2 kg) 137 lb 8 oz (62.4 kg)     Physical Exam   General: Well developed, well nourished, female appearing in no acute distress. Head: Normocephalic, atraumatic.  Neck: Supple without bruits, JVD Lungs:  Resp regular and unlabored, CTA. Heart: RRR, S1, S2, no S3, S4, or murmur; no rub. Abdomen: Soft, non-tender, non-distended with normoactive bowel sounds. No hepatomegaly. No rebound/guarding. No obvious abdominal masses. Extremities: No clubbing, cyanosis, no edema. Distal pedal pulses are 2+ bilaterally. Neuro: Alert and oriented X 3. Moves all extremities spontaneously. Psych: Normal affect.  Labs    Chemistry Recent Labs Lab 04/06/17 1620  04/09/17 0719 04/10/17 0318 04/11/17 0233  NA 136  < > 135 138 135  K 2.9*  < > 3.4* 3.7 3.3*  CL 106  < > 105 104 105  CO2 19*  < > 23 26 22     GLUCOSE 123*  < > 100* 122* 95  BUN 19  < > 8 6 7   CREATININE 0.93  < > 0.55 0.59 0.60  CALCIUM 8.4*  < > 9.0 8.8* 9.1  PROT 6.2*  --   --   --   --   ALBUMIN 2.6*  --   --   --   --   AST 33  --   --   --   --   ALT 21  --   --   --   --   ALKPHOS 193*  --   --   --   --   BILITOT 1.7*  --   --   --   --   GFRNONAA 53*  < > >60 >60 >60  GFRAA >60  < > >60 >60 >60  ANIONGAP 11  < > 7 8 8   < > = values in this interval not displayed.   Hematology Recent Labs Lab 04/09/17 0719 04/10/17 6203 04/11/17  0233  WBC 11.2* 13.5* 16.9*  RBC 3.98 3.95 3.98  HGB 12.5 12.1 12.4  HCT 37.2 36.5 36.9  MCV 93.5 92.4 92.7  MCH 31.4 30.6 31.2  MCHC 33.6 33.2 33.6  RDW 14.2 13.9 14.1  PLT 181 215 201    Cardiac EnzymesNo results for input(s): TROPONINI in the last 168 hours. No results for input(s): TROPIPOC in the last 168 hours.   BNPNo results for input(s): BNP, PROBNP in the last 168 hours.   DDimer No results for input(s): DDIMER in the last 168 hours.   Radiology    Dg Chest 2 View  Result Date: 04/11/2017 CLINICAL DATA:  Shortness of breath.  Leukocytosis.  History of UTI. EXAM: CHEST  2 VIEW COMPARISON:  04/06/2017 FINDINGS: The cardiomediastinal silhouette is borderline enlarged but stable in size. There are calcifications of the aortic arch. Note is made of mild pulmonary vascular congestion without frank edema. There is bibasilar atelectasis with small, increasing bilateral pleural effusions. No pneumothorax. Osseous structures grossly unremarkable. IMPRESSION: 1. Slight interval increase in bibasilar pleural effusions and associated atelectasis. 2. Stable cardiomegaly and pulmonary vascular congestion. Electronically Signed   By: Kristopher Oppenheim M.D.   On: 04/11/2017 09:26     Telemetry    NSR with one bout of sinus tach that spontaneously resolved - Personally Reviewed  ECG    No new tracings - Personally Reviewed  Cardiac Studies   Echo 04/10/09 Study Conclusions -  Left ventricle: The cavity size was normal. There was moderate   focal basal and mild concentric hypertrophy of the remaining   myocardium. Systolic function was normal. The estimated ejection   fraction was in the range of 60% to 65%. Wall motion was normal;   there were no regional wall motion abnormalities. Features are   consistent with a pseudonormal left ventricular filling pattern,   with concomitant abnormal relaxation and increased filling   pressure (grade 2 diastolic dysfunction). Doppler parameters are   consistent with elevated ventricular end-diastolic filling   pressure. - Aortic valve: Trileaflet; mildly thickened, mildly calcified   leaflets. Transvalvular velocity was within the normal range.   There was no stenosis. - Mitral valve: Calcified annulus. Mildly thickened leaflets .   There was moderate regurgitation directed centrally. Valve area   by continuity equation (using LVOT flow): 1.93 cm^2. - Left atrium: The atrium was moderately dilated. - Right ventricle: Systolic function was normal. - Right atrium: The atrium was mildly dilated. - Tricuspid valve: There was moderate regurgitation. - Pulmonary arteries: Systolic pressure was moderately increased.   PA peak pressure: 54 mm Hg (S).  Patient Profile     83 y.o. female with newly diagnosed paroxysmal atrial fibrillation with extreme RVR during urosepsis and hypokalemia, very difficult to rate control, but converted to NSR on iv amiodarone with severe post-conversion pauses  Assessment & Plan    1. Afib - she is maintaining NSR on telemetry - 1 bout of sinus tach that may have corresponded with either her overnight fever or muscle cramps in her legs - echocardiogram yesterday with normal LV function, no WMA - stopped amiodarone yesterday - she is maintaining on BB - continue xarelto   2. Muscle cramps, hypokalemia, low Mg - K 3.3, replaced with PO  - Mg 1.6, replaced with IV - management per primary  team   3. HTN - continue norvasc   Signed, Ledora Bottcher , PA-C 1:48 PM 04/11/2017 Pager: [phone]  I have seen and examined the  patient along with Fabian Sharp, PA.  I have reviewed the chart, notes and new data.  I agree with PA's note.  Key new complaints: Fever and muscle cramps overnight, but no chest pain, dyspnea or palpitations Key examination changes: No clinical evidence of hypervolemia Key new findings / data: 10-11 beat run of regular paroxysmal atrial tachycardia overnight, no new atrial fibrillation and no pauses.  PLAN: Potassium and magnesium are being replaced Discharge with oral anticoagulant and previous dose of beta blocker. Reevaluate in clinic in 6 weeks. We'll sign off for now, please reconsult cardiology if she has new arrhythmia or other problems  Sanda Klein, MD, [place] [phone] 04/11/2017, 2:35 PM

## 2017-04-11 NOTE — Plan of Care (Signed)
Problem: Education: Goal: Knowledge of [facility] Education information/materials will improve Outcome: Progressing Needs continuos reinforcement  Problem: Health Behavior/Discharge Planning: Goal: Ability to manage health-related needs will improve Outcome: Progressing Verbalized understanding

## 2017-04-11 NOTE — Progress Notes (Signed)
Pharmacy Antibiotic Note  Nancy Hayes is a 83 y.o. female admitted on 04/06/2017 with UTI.  Pharmacy has been consulted for Rocephin dosing; pt had been on Rocephin, changed yesterday to amoxicillin, now to go back to Rocephin.  Plan: Rocephin 2g IV Q24H.   Height: 5\' 6"  (167.6 cm) Weight: 137 lb 8 oz (62.4 kg) IBW/kg (Calculated) : 59.3  Temp (24hrs), Avg:98.7 F (37.1 C), Min:97.6 F (36.4 C), Max:101.6 F (38.7 C)   Recent Labs Lab 04/06/17 1620 04/06/17 1632 04/06/17 1936 04/06/17 2056 04/06/17 2359 04/08/17 0840 04/09/17 0719 04/10/17 0318 04/11/17 0233  WBC 7.9  --   --   --   --  12.6* 11.2* 13.5* 16.9*  CREATININE 0.93  --   --   --  0.86 0.64 0.55 0.59 0.60  LATICACIDVEN  --  3.57* 2.47* 1.7 1.7  --   --   --   --     Estimated Creatinine Clearance: 45.5 mL/min (by C-G formula based on SCr of 0.6 mg/dL).    No Known Allergies   Thank you for allowing pharmacy to be a part of this patient's care.  Wynona Neat, PharmD, BCPS  04/11/2017 7:13 AM

## 2017-04-11 NOTE — Progress Notes (Signed)
PROGRESS NOTE    Nancy Hayes  EUM:[id number] DOB: March 17, 1927 DOA: 04/06/2017 PCP: Aletha Halim., PA-C   Chief Complaint  Patient presents with  . Weakness    Brief Narrative:  HPI on 04/06/2017 by Dr. Hosie Poisson Nancy Hayes is a 83 y.o. female with medical history significant of hypertension, arthritis, gout, vertigo, was brought in by her son for fever and chills since last night, associated with flank pain, nausea, vomiting yesterday, . On arrival to ED, she was febrile, tachycardic, . Labs revealed potassium of 2.9, elevated lactic acid. UA IS abnormal.she was referred to medical service for admission for sepsis from UTI. She denies chest pain, sob, palpitations, cough, no sick contacts. No diarrhea or abdominal pain. No dizziness or syncope. She reports weakness and unable to walk from weakness.   Interim history Developed Afib RVR.   Assessment & Plan   New onset Atrial fibrillation With RVR -Patient had new onset atrial fibrillation with heart rate in the 170 to 180s on 04/09/2017 -Was started on IV heparin and cardizem drip, and given IV metoprolol -Potassium 3.3 despite oral supplementation, will order liquid form for better absorption -magnesium 1.6, will supplement -TSH 2.797, FT4 1.1 (WNL) -Echocardiogram EF 5400%, grade 2 diastolic dysfunction, PA peak pressure of 54 mmHg -Cardiology consulted and appreciated, and transitioned patient to Xarelto, and started patient on amiodarone drip as she began to have pauses  -patient was admitted with sepsis however, sepsis appears to be improving  -CHADSVASC 4 (based on age, gender, HTN) -Patient converted to sinus rhythm on 8/8, in the evening.   Sepsis secondary to UTI/Ecoli bacteremia -Patient with tachypneic, with leukocytosis on admission -UA: Many bacteria, large leukocytes, TNTC WBC -Urine culture >100k Ecoli -Blood cultures 04/06/17: 2/2 Ecoli -repeat blood cultures 04/08/17: shows no growth to date -Currently on  ceftriaxone -Overnight, patient spiked a fever with increased leukocytosis  -Will order repeat UA, blood cultures -Repeat chest x-ray unremarkable for infection  Essential hypertension -Continue metoprolol  -Norvasc restarted, 2.5 mg daily  Hypokalemia -Potassium 3.3, despite supplementation -Have ordered both pill as well as liquid form, possibly for better absorption  Hypomagnesemia -Potassium 1.6, will replace with IV supplementation and monitor  Muscle cramps -Suspect secondary to hypokalemia, hopefully will improve once potassium and magnesium have been replaced   DVT Prophylaxis  xarelto   Code Status: Full  Family Communication: None at bedside  Disposition Plan: Admitted, Continue to monitor in stepdown  Consultants Cardiology  Procedures  Echocardiogram  Antibiotics   Anti-infectives    Start     Dose/Rate Route Frequency Ordered Stop   04/11/17 0800  cefTRIAXone (ROCEPHIN) 2 g in dextrose 5 % 50 mL IVPB     2 g 100 mL/hr over 30 Minutes Intravenous Every 24 hours 04/11/17 0715     04/10/17 1400  amoxicillin (AMOXIL) capsule 500 mg  Status:  Discontinued     500 mg Oral Every 8 hours 04/10/17 1301 04/11/17 0708   04/07/17 1800  cefTRIAXone (ROCEPHIN) 1 g in dextrose 5 % 50 mL IVPB  Status:  Discontinued     1 g 100 mL/hr over 30 Minutes Intravenous Every 24 hours 04/06/17 1624 04/07/17 0827   04/07/17 1600  cefTRIAXone (ROCEPHIN) 2 g in dextrose 5 % 50 mL IVPB  Status:  Discontinued     2 g 100 mL/hr over 30 Minutes Intravenous Every 24 hours 04/07/17 0827 04/10/17 1301   04/06/17 1630  cefTRIAXone (ROCEPHIN) 2 g in dextrose 5 %  50 mL IVPB     2 g 100 mL/hr over 30 Minutes Intravenous  Once 04/06/17 1617 04/06/17 1712      Subjective:   Nancy Hayes seen and examined today. Patient feeling well this morning. Has no complaints. Did complain of muscle aches and pains and cramping overnight. Denied any chest pain, palpitations, abdominal pain, nausea or  vomiting, diarrhea or constipation.  Objective:   Vitals:   04/11/17 0500 04/11/17 0700 04/11/17 0944 04/11/17 1153  BP: 139/61  (!) 149/70   Pulse: 72   68  Resp: (!) 21     Temp:  97.9 F (36.6 C)  (!) 97.5 F (36.4 C)  TempSrc:  Oral  Oral  SpO2: 100%   98%  Weight:      Height:        Intake/Output Summary (Last 24 hours) at 04/11/17 1155 Last data filed at 04/11/17 0500  Gross per 24 hour  Intake              620 ml  Output              700 ml  Net              -80 ml   Filed Weights   04/06/17 1606 04/06/17 2005  Weight: 61.2 kg (135 lb) 62.4 kg (137 lb 8 oz)   Exam  General: Well developed, well nourished, NAD, appears stated age  5: NCAT, mucous membranes moist.   Cardiovascular: S1 S2 auscultated, RRR, no murmur  Respiratory: Clear to auscultation bilaterally with equal chest rise  Abdomen: Soft, nontender, nondistended, + bowel sounds  Extremities: warm dry without cyanosis clubbing or edema  Neuro: AAOx3, nonfocal  Psych: Appropriate mood and affect, pleasant  Data Reviewed: I have personally reviewed following labs and imaging studies  CBC:  Recent Labs Lab 04/06/17 1620 04/08/17 0840 04/09/17 0719 04/10/17 0318 04/11/17 0233  WBC 7.9 12.6* 11.2* 13.5* 16.9*  NEUTROABS 7.6  --   --   --   --   HGB 12.5 12.0 12.5 12.1 12.4  HCT 38.0 35.5* 37.2 36.5 36.9  MCV 93.4 92.4 93.5 92.4 92.7  PLT 143* 169 181 215 329   Basic Metabolic Panel:  Recent Labs Lab 04/06/17 2359 04/08/17 0840 04/09/17 0719 04/10/17 0318 04/11/17 0233  NA 136 137 135 138 135  K 3.3* 4.5 3.4* 3.7 3.3*  CL 107 107 105 104 105  CO2 21* 24 23 26 22   GLUCOSE 180* 110* 100* 122* 95  BUN 17 10 8 6 7   CREATININE 0.86 0.64 0.55 0.59 0.60  CALCIUM 8.7* 9.0 9.0 8.8* 9.1  MG  --   --  1.8 2.1 1.6*   GFR: Estimated Creatinine Clearance: 45.5 mL/min (by C-G formula based on SCr of 0.6 mg/dL). Liver Function Tests:  Recent Labs Lab 04/06/17 1620  AST 33  ALT  21  ALKPHOS 193*  BILITOT 1.7*  PROT 6.2*  ALBUMIN 2.6*   No results for input(s): LIPASE, AMYLASE in the last 168 hours. No results for input(s): AMMONIA in the last 168 hours. Coagulation Profile:  Recent Labs Lab 04/06/17 1620  INR 1.41   Cardiac Enzymes: No results for input(s): CKTOTAL, CKMB, CKMBINDEX, TROPONINI in the last 168 hours. BNP (last 3 results) No results for input(s): PROBNP in the last 8760 hours. HbA1C: No results for input(s): HGBA1C in the last 72 hours. CBG: No results for input(s): GLUCAP in the last 168 hours. Lipid Profile: No results  for input(s): CHOL, HDL, LDLCALC, TRIG, CHOLHDL, LDLDIRECT in the last 72 hours. Thyroid Function Tests:  Recent Labs  04/09/17 1208  TSH 2.797  FREET4 1.10   Anemia Panel: No results for input(s): VITAMINB12, FOLATE, FERRITIN, TIBC, IRON, RETICCTPCT in the last 72 hours. Urine analysis:    Component Value Date/Time   COLORURINE YELLOW 04/06/2017 1632   APPEARANCEUR TURBID (A) 04/06/2017 1632   LABSPEC 1.025 04/06/2017 1632   PHURINE 6.0 04/06/2017 1632   GLUCOSEU NEGATIVE 04/06/2017 1632   HGBUR MODERATE (A) 04/06/2017 1632   BILIRUBINUR NEGATIVE 04/06/2017 1632   KETONESUR NEGATIVE 04/06/2017 1632   PROTEINUR 100 (A) 04/06/2017 1632   UROBILINOGEN 1.0 10/27/2009 0051   NITRITE NEGATIVE 04/06/2017 1632   LEUKOCYTESUR LARGE (A) 04/06/2017 1632   Sepsis Labs: @LABRCNTIP (procalcitonin:4,lacticidven:4)  ) Recent Results (from the past 240 hour(s))  Culture, blood (Routine x 2)     Status: Abnormal   Collection Time: 04/06/17  4:26 PM  Result Value Ref Range Status   Specimen Description BLOOD RIGHT FOREARM  Final   Special Requests   Final    BOTTLES DRAWN AEROBIC AND ANAEROBIC Blood Culture adequate volume   Culture  Setup Time   Final    GRAM NEGATIVE RODS IN BOTH AEROBIC AND ANAEROBIC BOTTLES CRITICAL VALUE NOTED.  VALUE IS CONSISTENT WITH PREVIOUSLY REPORTED AND CALLED VALUE.    Culture (A)   Final    ESCHERICHIA COLI SUSCEPTIBILITIES PERFORMED ON PREVIOUS CULTURE WITHIN THE LAST 5 DAYS.    Report Status 04/09/2017 FINAL  Final  Culture, Urine     Status: Abnormal   Collection Time: 04/06/17  4:32 PM  Result Value Ref Range Status   Specimen Description URINE, RANDOM  Final   Special Requests NONE  Final   Culture >=100,000 COLONIES/mL ESCHERICHIA COLI (A)  Final   Report Status 04/10/2017 FINAL  Final   Organism ID, Bacteria ESCHERICHIA COLI (A)  Final      Susceptibility   Escherichia coli - MIC*    AMPICILLIN <=2 SENSITIVE Sensitive     CEFAZOLIN <=4 SENSITIVE Sensitive     CEFTRIAXONE <=1 SENSITIVE Sensitive     CIPROFLOXACIN >=4 RESISTANT Resistant     GENTAMICIN <=1 SENSITIVE Sensitive     IMIPENEM <=0.25 SENSITIVE Sensitive     NITROFURANTOIN <=16 SENSITIVE Sensitive     TRIMETH/SULFA <=20 SENSITIVE Sensitive     AMPICILLIN/SULBACTAM <=2 SENSITIVE Sensitive     PIP/TAZO <=4 SENSITIVE Sensitive     Extended ESBL NEGATIVE Sensitive     * >=100,000 COLONIES/mL ESCHERICHIA COLI  Culture, blood (Routine x 2)     Status: Abnormal   Collection Time: 04/06/17  4:46 PM  Result Value Ref Range Status   Specimen Description BLOOD LEFT WRIST  Final   Special Requests   Final    BOTTLES DRAWN AEROBIC AND ANAEROBIC Blood Culture adequate volume   Culture  Setup Time   Final    GRAM NEGATIVE RODS IN BOTH AEROBIC AND ANAEROBIC BOTTLES CRITICAL RESULT CALLED TO, READ BACK BY AND VERIFIED WITH: A. JOHNSTON PHARM 04/07/17 0825 BEAMJ    Culture ESCHERICHIA COLI (A)  Final   Report Status 04/09/2017 FINAL  Final   Organism ID, Bacteria ESCHERICHIA COLI  Final      Susceptibility   Escherichia coli - MIC*    AMPICILLIN <=2 SENSITIVE Sensitive     CEFAZOLIN <=4 SENSITIVE Sensitive     CEFEPIME <=1 SENSITIVE Sensitive     CEFTAZIDIME <=1 SENSITIVE Sensitive  CEFTRIAXONE <=1 SENSITIVE Sensitive     CIPROFLOXACIN >=4 RESISTANT Resistant     GENTAMICIN <=1 SENSITIVE  Sensitive     IMIPENEM <=0.25 SENSITIVE Sensitive     TRIMETH/SULFA <=20 SENSITIVE Sensitive     AMPICILLIN/SULBACTAM <=2 SENSITIVE Sensitive     PIP/TAZO <=4 SENSITIVE Sensitive     Extended ESBL NEGATIVE Sensitive     * ESCHERICHIA COLI  Blood Culture ID Panel (Reflexed)     Status: Abnormal   Collection Time: 04/06/17  4:46 PM  Result Value Ref Range Status   Enterococcus species NOT DETECTED NOT DETECTED Final   Listeria monocytogenes NOT DETECTED NOT DETECTED Final   Staphylococcus species NOT DETECTED NOT DETECTED Final   Staphylococcus aureus NOT DETECTED NOT DETECTED Final   Streptococcus species NOT DETECTED NOT DETECTED Final   Streptococcus agalactiae NOT DETECTED NOT DETECTED Final   Streptococcus pneumoniae NOT DETECTED NOT DETECTED Final   Streptococcus pyogenes NOT DETECTED NOT DETECTED Final   Acinetobacter baumannii NOT DETECTED NOT DETECTED Final   Enterobacteriaceae species DETECTED (A) NOT DETECTED Final    Comment: Enterobacteriaceae represent a large family of gram-negative bacteria, not a single organism. CRITICAL RESULT CALLED TO, READ BACK BY AND VERIFIED WITH: A. JOHNSTON PHARM 04/17/17 0825 BEAMJ    Enterobacter cloacae complex NOT DETECTED NOT DETECTED Final   Escherichia coli DETECTED (A) NOT DETECTED Final    Comment: CRITICAL RESULT CALLED TO, READ BACK BY AND VERIFIED WITH: A. JOHNSTON PHARM 04/07/17 0825 BEAMJ    Klebsiella oxytoca NOT DETECTED NOT DETECTED Final   Klebsiella pneumoniae NOT DETECTED NOT DETECTED Final   Proteus species NOT DETECTED NOT DETECTED Final   Serratia marcescens NOT DETECTED NOT DETECTED Final   Carbapenem resistance NOT DETECTED NOT DETECTED Final   Haemophilus influenzae NOT DETECTED NOT DETECTED Final   Neisseria meningitidis NOT DETECTED NOT DETECTED Final   Pseudomonas aeruginosa NOT DETECTED NOT DETECTED Final   Candida albicans NOT DETECTED NOT DETECTED Final   Candida glabrata NOT DETECTED NOT DETECTED Final    Candida krusei NOT DETECTED NOT DETECTED Final   Candida parapsilosis NOT DETECTED NOT DETECTED Final   Candida tropicalis NOT DETECTED NOT DETECTED Final  Culture, blood (Routine X 2) w Reflex to ID Panel     Status: None (Preliminary result)   Collection Time: 04/08/17 11:52 AM  Result Value Ref Range Status   Specimen Description BLOOD RIGHT ANTECUBITAL  Final   Special Requests   Final    BOTTLES DRAWN AEROBIC ONLY Blood Culture results may not be optimal due to an excessive volume of blood received in culture bottles   Culture NO GROWTH 2 DAYS  Final   Report Status PENDING  Incomplete  Culture, blood (Routine X 2) w Reflex to ID Panel     Status: None (Preliminary result)   Collection Time: 04/08/17 11:57 AM  Result Value Ref Range Status   Specimen Description BLOOD LEFT HAND  Final   Special Requests   Final    BOTTLES DRAWN AEROBIC ONLY Blood Culture results may not be optimal due to an excessive volume of blood received in culture bottles   Culture NO GROWTH 2 DAYS  Final   Report Status PENDING  Incomplete      Radiology Studies: Dg Chest 2 View  Result Date: 04/11/2017 CLINICAL DATA:  Shortness of breath.  Leukocytosis.  History of UTI. EXAM: CHEST  2 VIEW COMPARISON:  04/06/2017 FINDINGS: The cardiomediastinal silhouette is borderline enlarged but stable in size. There  are calcifications of the aortic arch. Note is made of mild pulmonary vascular congestion without frank edema. There is bibasilar atelectasis with small, increasing bilateral pleural effusions. No pneumothorax. Osseous structures grossly unremarkable. IMPRESSION: 1. Slight interval increase in bibasilar pleural effusions and associated atelectasis. 2. Stable cardiomegaly and pulmonary vascular congestion. Electronically Signed   By: Kristopher Oppenheim M.D.   On: 04/11/2017 09:26     Scheduled Meds: . amLODipine  2.5 mg Oral Daily  . aspirin EC  81 mg Oral Daily  . metoprolol succinate  50 mg Oral Daily  .  nystatin  5 mL Oral QID  . potassium chloride  40 mEq Oral Daily  . rivaroxaban  20 mg Oral Q supper   Continuous Infusions: . cefTRIAXone (ROCEPHIN)  IV 2 g (04/11/17 0951)     LOS: 5 days   Time Spent in minutes   45 minutes  Keviana Guida D.O. on 04/11/2017 at 11:55 AM  Between 7am to 7pm - Pager - [phone]  After 7pm go to [email or web address] - password [email or web address]  And look for the night coverage person covering for me after hours  [facility] Office  [phone]

## 2017-04-11 NOTE — Plan of Care (Signed)
Problem: Pain Managment: Goal: General experience of comfort will improve Outcome: Progressing Verbalized understanding   

## 2017-04-12 DIAGNOSIS — R7881 Bacteremia: Secondary | ICD-10-CM

## 2017-04-12 LAB — BASIC METABOLIC PANEL
ANION GAP: 5 (ref 5–15)
BUN: 8 mg/dL (ref 6–20)
CHLORIDE: 106 mmol/L (ref 101–111)
CO2: 24 mmol/L (ref 22–32)
Calcium: 8.9 mg/dL (ref 8.9–10.3)
Creatinine, Ser: 0.62 mg/dL (ref 0.44–1.00)
GFR calc non Af Amer: >60 mL/min (ref >60)
Glucose, Bld: 119 mg/dL — ABNORMAL HIGH (ref 65–99)
Potassium: 4.4 mmol/L (ref 3.5–5.1)
SODIUM: 135 mmol/L (ref 135–145)

## 2017-04-12 LAB — CBC
HEMATOCRIT: 33.8 % — AB (ref 36.0–46.0)
HEMOGLOBIN: 11.3 g/dL — AB (ref 12.0–15.0)
MCH: 30.9 pg (ref 26.0–34.0)
MCHC: 33.4 g/dL (ref 30.0–36.0)
MCV: 92.3 fL (ref 78.0–100.0)
Platelets: 229 10*3/uL (ref 150–400)
RBC: 3.66 MIL/uL — AB (ref 3.87–5.11)
RDW: 14.2 % (ref 11.5–15.5)
WBC: 16.4 10*3/uL — AB (ref 4.0–10.5)

## 2017-04-12 LAB — MAGNESIUM: MAGNESIUM: 2.1 mg/dL (ref 1.7–2.4)

## 2017-04-12 MED ORDER — RIVAROXABAN 20 MG PO TABS: 20.0000 mg | ORAL_TABLET | Freq: Every day | ORAL | 0 refills | Status: DC

## 2017-04-12 MED ORDER — CEFPODOXIME PROXETIL 200 MG PO TABS: 400.0000 mg | ORAL_TABLET | Freq: Two times a day (BID) | ORAL | 0 refills | Status: DC

## 2017-04-12 MED ORDER — MAGNESIUM OXIDE 400 MG PO TABS: 400.0000 mg | ORAL_TABLET | Freq: Every day | ORAL | 0 refills | Status: DC

## 2017-04-12 NOTE — Progress Notes (Signed)
Left forearm saline lock iv dcd. Site unremarkable.Pressure dressing applied. No bleeding noted.

## 2017-04-12 NOTE — Discharge Summary (Signed)
Physician Discharge Summary  Nancy Hayes:[id number] DOB: 07-14-1927 DOA: 04/06/2017  PCP: Aletha Halim., PA-C  Admit date: 04/06/2017 Discharge date: 04/12/2017  Time spent: 45 minutes  Recommendations for Outpatient Follow-up:  Patient will be discharged to home with home health physical therapy.  Patient will need to follow up with primary care provider within one week of discharge.  Follow up with Dr. Sallyanne Kuster, cardiologist, in 6 weeks. Patient should continue medications as prescribed.  Patient should follow a heart healthy diet.   Discharge Diagnoses:  New onset Atrial fibrillation With RVR Sepsis secondary to UTI/Ecoli bacteremia Essential hypertension Hypokalemia Hypomagnesemia Muscle cramps  Discharge Condition: Stable   Diet recommendation: Heart healthy  Filed Weights   04/06/17 1606 04/06/17 2005  Weight: 61.2 kg (135 lb) 62.4 kg (137 lb 8 oz)    History of present illness:  on 04/06/2017 by Dr. Hosie Poisson Nancy G Pulliamis a 83 y.o.femalewith medical history significant of hypertension, arthritis, gout, vertigo, was brought in by her son for fever and chills since last night, associated with flank pain, nausea, vomiting yesterday, . On arrival to ED, she was febrile, tachycardic, . Labs revealed potassium of 2.9, elevated lactic acid. UA IS abnormal.she was referred to medical service for admission for sepsis from UTI. She denies chest pain, sob, palpitations, cough, no sick contacts. No diarrhea or abdominal pain. No dizziness or syncope. She reports weakness and unable to walk from weakness.   Hospital Course:  New onset Atrial fibrillation With RVR -Patient had new onset atrial fibrillation with heart rate in the 170 to 180s on 04/09/2017 -Was started on IV heparin and cardizem drip, and given IV metoprolol -Magnesium and potassium replaced and WNL -TSH 2.797, FT4 1.1 (WNL) -Echocardiogram EF 8119%, grade 2 diastolic dysfunction, PA peak pressure of 54  mmHg -Cardiology consulted and appreciated, and transitioned patient to Xarelto, and started patient on amiodarone drip as she began to have pauses  -patient was admitted with sepsis however, sepsis appears to be improving  -CHADSVASC 4 (based on age, gender, HTN) -Patient converted to sinus rhythm on 8/8, in the evening.  -Follow up with Dr. Sallyanne Kuster in 6 weeks  Sepsis secondary to UTI/Ecoli bacteremia -Patient with tachypneic, with leukocytosis on admission -UA: Many bacteria, large leukocytes, TNTC WBC -Urine culture >100k Ecoli -Blood cultures 04/06/17: 2/2 Ecoli -repeat blood cultures 04/08/17: shows no growth to date -was placed on ceftriaxone (was switched to PO augmentin, however, spiked fever and WBC) -Overnight 04/10/2017, patient spiked a fever with increased leukocytosis  -Repeat UA: rare bacteria, large leukocytes, WBC TNTC -slight bibasilar pleural effusions and assoc atelectasis, stable cardiomegaly and pulm vasc congestion -Currently afebrile -Will discharge with vantin 400mg  BID for additional 7 days  Essential hypertension -Continue metoprolol  -Norvasc restarted, 2.5 mg daily  Hypokalemia -Potassium 4.4 -Repeat BMP in one week   Hypomagnesemia -Magnesium replaced, currently 2.1 -Would continue PO supplementation, with repeat magnesium in one week   Muscle cramps -Suspect secondary to hypokalemia -Improved  Procedures: Echocardiogram  Consultations: Cardiology   Discharge Exam: Vitals:   04/12/17 0900 04/12/17 1000  BP: (!) 174/63 (!) 160/57  Pulse: 65 64  Resp: (!) 22 (!) 21  Temp:    SpO2: 100% 100%   Patient feeling much better today. Denies chest pain, shortness of breath, abdominal pain, N/V/D/C, dizziness,headache. Feels she is ready to go home.    General: Well developed, well nourished, NAD, appears stated age  HEENT: NCAT, mucous membranes moist.  Cardiovascular: S1 S2  auscultated, no murmurs, RRR  Respiratory: Clear to auscultation  bilaterally with equal chest rise, no wheezing   Abdomen: Soft, nontender, nondistended, + bowel sounds  Extremities: warm dry without cyanosis clubbing or edema  Neuro: AAOx3, nonfocal  Psych: Normal affect and demeanor with intact judgement and insight, pleasant  Discharge Instructions Discharge Instructions    Discharge instructions    Complete by:  As directed    Patient will be discharged to home with home health physical therapy.  Patient will need to follow up with primary care provider within one week of discharge.  Follow up with Dr. Sallyanne Kuster, cardiologist, in 6 weeks. Patient should continue medications as prescribed.  Patient should follow a heart healthy diet.     Current Discharge Medication List    START taking these medications   Details  cefpodoxime (VANTIN) 200 MG tablet Take 2 tablets (400 mg total) by mouth 2 (two) times daily. Qty: 28 tablet, Refills: 0    magnesium oxide (MAG-OX) 400 MG tablet Take 1 tablet (400 mg total) by mouth daily. Qty: 30 tablet, Refills: 0    rivaroxaban (XARELTO) 20 MG TABS tablet Take 1 tablet (20 mg total) by mouth daily with supper. Qty: 30 tablet, Refills: 0      CONTINUE these medications which have NOT CHANGED   Details  amLODipine (NORVASC) 2.5 MG tablet Take 2.5 mg by mouth daily.    aspirin EC 81 MG tablet Take 81 mg by mouth daily.    Calcium Carbonate-Vitamin D (CALCIUM-D PO) Take 1,200 mg by mouth daily.    metoprolol succinate (TOPROL-XL) 50 MG 24 hr tablet Take 50 mg by mouth daily.     Multiple Vitamins-Minerals (PRESERVISION AREDS 2) CAPS Take 2 capsules by mouth daily.    Omega-3 Fatty Acids (FISH OIL) 1000 MG CAPS Take 1,000 mg by mouth daily.    polyvinyl alcohol (ARTIFICIAL TEARS) 1.4 % ophthalmic solution Place 1 drop into both eyes 2 (two) times daily.       No Known Allergies Follow-up Information    Aletha Halim., PA-C. Schedule an appointment as soon as possible for a visit in 1 week(s).     Specialty:  Family Medicine Why:  Hospital follow up Contact information: [address] [place] [address] [phone]        Croitoru, Dani Gobble, MD. Schedule an appointment as soon as possible for a visit in 6 week(s).   Specialty:  Cardiology Why:  Hospital follow up Contact information: [address] [place] [address] [phone]            The results of significant diagnostics from this hospitalization (including imaging, microbiology, ancillary and laboratory) are listed below for reference.    Significant Diagnostic Studies: Dg Chest 2 View  Result Date: 04/11/2017 CLINICAL DATA:  Shortness of breath.  Leukocytosis.  History of UTI. EXAM: CHEST  2 VIEW COMPARISON:  04/06/2017 FINDINGS: The cardiomediastinal silhouette is borderline enlarged but stable in size. There are calcifications of the aortic arch. Note is made of mild pulmonary vascular congestion without frank edema. There is bibasilar atelectasis with small, increasing bilateral pleural effusions. No pneumothorax. Osseous structures grossly unremarkable. IMPRESSION: 1. Slight interval increase in bibasilar pleural effusions and associated atelectasis. 2. Stable cardiomegaly and pulmonary vascular congestion. Electronically Signed   By: Kristopher Oppenheim M.D.   On: 04/11/2017 09:26   Dg Chest 2 View  Result Date: 04/06/2017 CLINICAL DATA:  Pt reports fever today of 102, UTI dx today,  some SOB and weakness x 3-4 days; pt reports she takes meds for HTN; non-smoker EXAM: CHEST  2 VIEW COMPARISON:  10/14/2014 FINDINGS: Heart is mildly enlarged but also accentuated by the technique. Shallow lung inflation. There is pulmonary vascular congestion but no overt edema. Focal left lower lobe atelectasis is present. Small bilateral pleural effusions. There is dense atherosclerotic calcification of the thoracic aorta. IMPRESSION: 1. Cardiomegaly and pulmonary vascular congestion. 2. Left lower lobe  atelectasis.  Bilateral pleural effusions. 3.  Aortic atherosclerosis. Electronically Signed   By: Nolon Nations M.D.   On: 04/06/2017 17:12    Microbiology: Recent Results (from the past 240 hour(s))  Culture, blood (Routine x 2)     Status: Abnormal   Collection Time: 04/06/17  4:26 PM  Result Value Ref Range Status   Specimen Description BLOOD RIGHT FOREARM  Final   Special Requests   Final    BOTTLES DRAWN AEROBIC AND ANAEROBIC Blood Culture adequate volume   Culture  Setup Time   Final    GRAM NEGATIVE RODS IN BOTH AEROBIC AND ANAEROBIC BOTTLES CRITICAL VALUE NOTED.  VALUE IS CONSISTENT WITH PREVIOUSLY REPORTED AND CALLED VALUE.    Culture (A)  Final    ESCHERICHIA COLI SUSCEPTIBILITIES PERFORMED ON PREVIOUS CULTURE WITHIN THE LAST 5 DAYS.    Report Status 04/09/2017 FINAL  Final  Culture, Urine     Status: Abnormal   Collection Time: 04/06/17  4:32 PM  Result Value Ref Range Status   Specimen Description URINE, RANDOM  Final   Special Requests NONE  Final   Culture >=100,000 COLONIES/mL ESCHERICHIA COLI (A)  Final   Report Status 04/10/2017 FINAL  Final   Organism ID, Bacteria ESCHERICHIA COLI (A)  Final      Susceptibility   Escherichia coli - MIC*    AMPICILLIN <=2 SENSITIVE Sensitive     CEFAZOLIN <=4 SENSITIVE Sensitive     CEFTRIAXONE <=1 SENSITIVE Sensitive     CIPROFLOXACIN >=4 RESISTANT Resistant     GENTAMICIN <=1 SENSITIVE Sensitive     IMIPENEM <=0.25 SENSITIVE Sensitive     NITROFURANTOIN <=16 SENSITIVE Sensitive     TRIMETH/SULFA <=20 SENSITIVE Sensitive     AMPICILLIN/SULBACTAM <=2 SENSITIVE Sensitive     PIP/TAZO <=4 SENSITIVE Sensitive     Extended ESBL NEGATIVE Sensitive     * >=100,000 COLONIES/mL ESCHERICHIA COLI  Culture, blood (Routine x 2)     Status: Abnormal   Collection Time: 04/06/17  4:46 PM  Result Value Ref Range Status   Specimen Description BLOOD LEFT WRIST  Final   Special Requests   Final    BOTTLES DRAWN AEROBIC AND ANAEROBIC  Blood Culture adequate volume   Culture  Setup Time   Final    GRAM NEGATIVE RODS IN BOTH AEROBIC AND ANAEROBIC BOTTLES CRITICAL RESULT CALLED TO, READ BACK BY AND VERIFIED WITH: A. JOHNSTON PHARM 04/07/17 0825 BEAMJ    Culture ESCHERICHIA COLI (A)  Final   Report Status 04/09/2017 FINAL  Final   Organism ID, Bacteria ESCHERICHIA COLI  Final      Susceptibility   Escherichia coli - MIC*    AMPICILLIN <=2 SENSITIVE Sensitive     CEFAZOLIN <=4 SENSITIVE Sensitive     CEFEPIME <=1 SENSITIVE Sensitive     CEFTAZIDIME <=1 SENSITIVE Sensitive     CEFTRIAXONE <=1 SENSITIVE Sensitive     CIPROFLOXACIN >=4 RESISTANT Resistant     GENTAMICIN <=1 SENSITIVE Sensitive     IMIPENEM <=0.25 SENSITIVE Sensitive  TRIMETH/SULFA <=20 SENSITIVE Sensitive     AMPICILLIN/SULBACTAM <=2 SENSITIVE Sensitive     PIP/TAZO <=4 SENSITIVE Sensitive     Extended ESBL NEGATIVE Sensitive     * ESCHERICHIA COLI  Blood Culture ID Panel (Reflexed)     Status: Abnormal   Collection Time: 04/06/17  4:46 PM  Result Value Ref Range Status   Enterococcus species NOT DETECTED NOT DETECTED Final   Listeria monocytogenes NOT DETECTED NOT DETECTED Final   Staphylococcus species NOT DETECTED NOT DETECTED Final   Staphylococcus aureus NOT DETECTED NOT DETECTED Final   Streptococcus species NOT DETECTED NOT DETECTED Final   Streptococcus agalactiae NOT DETECTED NOT DETECTED Final   Streptococcus pneumoniae NOT DETECTED NOT DETECTED Final   Streptococcus pyogenes NOT DETECTED NOT DETECTED Final   Acinetobacter baumannii NOT DETECTED NOT DETECTED Final   Enterobacteriaceae species DETECTED (A) NOT DETECTED Final    Comment: Enterobacteriaceae represent a large family of gram-negative bacteria, not a single organism. CRITICAL RESULT CALLED TO, READ BACK BY AND VERIFIED WITH: A. JOHNSTON PHARM 04/17/17 0825 BEAMJ    Enterobacter cloacae complex NOT DETECTED NOT DETECTED Final   Escherichia coli DETECTED (A) NOT DETECTED  Final    Comment: CRITICAL RESULT CALLED TO, READ BACK BY AND VERIFIED WITH: A. JOHNSTON PHARM 04/07/17 0825 BEAMJ    Klebsiella oxytoca NOT DETECTED NOT DETECTED Final   Klebsiella pneumoniae NOT DETECTED NOT DETECTED Final   Proteus species NOT DETECTED NOT DETECTED Final   Serratia marcescens NOT DETECTED NOT DETECTED Final   Carbapenem resistance NOT DETECTED NOT DETECTED Final   Haemophilus influenzae NOT DETECTED NOT DETECTED Final   Neisseria meningitidis NOT DETECTED NOT DETECTED Final   Pseudomonas aeruginosa NOT DETECTED NOT DETECTED Final   Candida albicans NOT DETECTED NOT DETECTED Final   Candida glabrata NOT DETECTED NOT DETECTED Final   Candida krusei NOT DETECTED NOT DETECTED Final   Candida parapsilosis NOT DETECTED NOT DETECTED Final   Candida tropicalis NOT DETECTED NOT DETECTED Final  Culture, blood (Routine X 2) w Reflex to ID Panel     Status: None (Preliminary result)   Collection Time: 04/08/17 11:52 AM  Result Value Ref Range Status   Specimen Description BLOOD RIGHT ANTECUBITAL  Final   Special Requests   Final    BOTTLES DRAWN AEROBIC ONLY Blood Culture results may not be optimal due to an excessive volume of blood received in culture bottles   Culture NO GROWTH 3 DAYS  Final   Report Status PENDING  Incomplete  Culture, blood (Routine X 2) w Reflex to ID Panel     Status: None (Preliminary result)   Collection Time: 04/08/17 11:57 AM  Result Value Ref Range Status   Specimen Description BLOOD LEFT HAND  Final   Special Requests   Final    BOTTLES DRAWN AEROBIC ONLY Blood Culture results may not be optimal due to an excessive volume of blood received in culture bottles   Culture NO GROWTH 3 DAYS  Final   Report Status PENDING  Incomplete  MRSA PCR Screening     Status: None   Collection Time: 04/11/17  3:02 PM  Result Value Ref Range Status   MRSA by PCR NEGATIVE NEGATIVE Final    Comment:        The GeneXpert MRSA Assay (FDA approved for NASAL  specimens only), is one component of a comprehensive MRSA colonization surveillance program. It is not intended to diagnose MRSA infection nor to guide or monitor treatment for MRSA  infections.      Labs: Basic Metabolic Panel:  Recent Labs Lab 04/08/17 0840 04/09/17 0719 04/10/17 0318 04/11/17 0233 04/12/17 0348  NA 137 135 138 135 135  K 4.5 3.4* 3.7 3.3* 4.4  CL 107 105 104 105 106  CO2 24 23 26 22 24   GLUCOSE 110* 100* 122* 95 119*  BUN 10 8 6 7 8   CREATININE 0.64 0.55 0.59 0.60 0.62  CALCIUM 9.0 9.0 8.8* 9.1 8.9  MG  --  1.8 2.1 1.6* 2.1   Liver Function Tests:  Recent Labs Lab 04/06/17 1620  AST 33  ALT 21  ALKPHOS 193*  BILITOT 1.7*  PROT 6.2*  ALBUMIN 2.6*   No results for input(s): LIPASE, AMYLASE in the last 168 hours. No results for input(s): AMMONIA in the last 168 hours. CBC:  Recent Labs Lab 04/06/17 1620 04/08/17 0840 04/09/17 0719 04/10/17 0318 04/11/17 0233 04/12/17 0348  WBC 7.9 12.6* 11.2* 13.5* 16.9* 16.4*  NEUTROABS 7.6  --   --   --   --   --   HGB 12.5 12.0 12.5 12.1 12.4 11.3*  HCT 38.0 35.5* 37.2 36.5 36.9 33.8*  MCV 93.4 92.4 93.5 92.4 92.7 92.3  PLT 143* 169 181 215 201 229   Cardiac Enzymes: No results for input(s): CKTOTAL, CKMB, CKMBINDEX, TROPONINI in the last 168 hours. BNP: BNP (last 3 results) No results for input(s): BNP in the last 8760 hours.  ProBNP (last 3 results) No results for input(s): PROBNP in the last 8760 hours.  CBG: No results for input(s): GLUCAP in the last 168 hours.     SignedCristal Ford  [facility] 04/12/2017, 11:02 AM

## 2017-04-12 NOTE — Progress Notes (Signed)
Reviewed discharge instructions with pt and her son including new medications and when to begin taking them, follow up appts and s/s of bleeding and infection. Pt and son verbalize understanding. Pt discharged in wheelchair.

## 2017-04-13 LAB — URINE CULTURE

## 2017-04-13 LAB — CULTURE, BLOOD (ROUTINE X 2)
Culture: NO GROWTH
Culture: NO GROWTH

## 2017-04-16 LAB — CULTURE, BLOOD (ROUTINE X 2)
CULTURE: NO GROWTH
Culture: NO GROWTH
Special Requests: ADEQUATE
Special Requests: ADEQUATE

## 2017-05-02 ENCOUNTER — Telehealth: Payer: Self-pay | Admitting: Cardiovascular Disease

## 2017-05-02 MED ORDER — AMLODIPINE BESYLATE 10 MG PO TABS: 10.0000 mg | ORAL_TABLET | Freq: Every day | ORAL | 2 refills | Status: DC

## 2017-05-02 NOTE — Telephone Encounter (Signed)
Corky Mull, PT & communicated exercise parameter recommendations.  Called patient, gave her recommendations for medication change and to continue follow BP at home. Advised her to call us in 1 week to relay BP readings, will seek further advice from Dr. Sallyanne Kuster if BP still elevated. Pt also aware to follow up as planned in September.

## 2017-05-02 NOTE — Telephone Encounter (Signed)
Pt of Dr. Sallyanne Kuster Hx HTN Was last seen in February w elevated readings. She returns on 9/21 for f/u.  Spoke to [place], PT w [facility]. She verbalized concern that the patient's BPs have been running high -- 180s/60s-70s, correlated over several readings for past several days.  She states the readings are from patient's own home BP checks as well as her checks with a separate cuff. She notes the patient has been generally asymptomatic/states no complaints except for brief dizziness the other day.   Shelly also states she needs BP parameters for when and when not to do exercises/phys therapy (o/w will go by [facility] guidelines of no exercise 160/90)  Also of note, PCP increased amlodipine from 2.5mg  to 5mg  DAILY 2 weeks ago.  Informed her I would seek guidance from Dr. Sallyanne Kuster for further med changes prior to f/u visit, any parameters recommended for exercise. She voiced understanding and thanks.

## 2017-05-02 NOTE — Telephone Encounter (Signed)
New message    Shelly from [facility] is calling.   Pt c/o BP issue: STAT if pt c/o blurred vision, one-sided weakness or slurred speech  1. What are your last 5 BP readings? 188/78, 180/73, 182/63  2. Are you having any other symptoms (ex. Dizziness, headache, blurred vision, passed out)? The other morning she was dizzy.   3. What is your BP issue? Pt BP has been running high.

## 2017-05-02 NOTE — Telephone Encounter (Signed)
Please increase amlodipine to 10 mg daily and call us back w BP in 7 days. OK to exercise if SBP<160, DBP<90. Thanks [employer]

## 2017-05-09 ENCOUNTER — Telehealth: Payer: Self-pay | Admitting: Cardiovascular Disease

## 2017-05-09 MED ORDER — HYDROCHLOROTHIAZIDE 12.5 MG PO CAPS: 12.5000 mg | ORAL_CAPSULE | ORAL | 3 refills | Status: DC

## 2017-05-09 NOTE — Telephone Encounter (Signed)
Please ask her to start taking Hydrochlorothiazide 12.5 mg 3 days a weekk only (for example MWF). She may still have some at home, she took it in the past.  It will take about 3 weeks to see full impact on BP. Please call back then with BP. Do not need to check every 2 hours, just once a day, at rest when relaxed. Give [place] 4-5 days of BP log.

## 2017-05-09 NOTE — Telephone Encounter (Signed)
[name] [facility]) notified he states taht he does not know right now if he has any of the HCTZ, I sent new rx to requested pharmacy as ordered

## 2017-05-09 NOTE — Telephone Encounter (Signed)
Please call,pt's son said he was supposed to be giving the nurse update on her blood pressure.

## 2017-05-09 NOTE — Telephone Encounter (Signed)
Pt's son states bp medication and he was told to take BP every 2 hours: 9-1 @930a   149/69 @ 330p  173/60 @530p  174/77, @630p  181/72 9-2 @930a  163/65, @230p  140/64, @430p  166/67, @9p  145/66 9-3 @9a  148/64, @1130a  166/64, @130p  152/59, @4p  176/76, @8p  171/74 9-4 @9a  148/64, @1140  a164/70, @2p  160/60, @4p  174/72, @8p  156/68 9-5 @830a  155/63, @1030a  154/64, @1245p  149/65, @230p  167/67, @730p  165/73, @845p  166/81  9-6 @9a  146/66, @1115a  163/67, @115p  160/67, @315p  150/66, @715p  159/62, @9p  160/68

## 2017-05-23 ENCOUNTER — Encounter (HOSPITAL_COMMUNITY): Payer: Self-pay | Admitting: General Practice

## 2017-05-23 ENCOUNTER — Ambulatory Visit (INDEPENDENT_AMBULATORY_CARE_PROVIDER_SITE_OTHER): Payer: Medicare Other | Admitting: Cardiovascular Disease

## 2017-05-23 ENCOUNTER — Other Ambulatory Visit: Payer: Self-pay

## 2017-05-23 ENCOUNTER — Telehealth: Payer: Self-pay | Admitting: Cardiovascular Disease

## 2017-05-23 ENCOUNTER — Encounter: Payer: Self-pay | Admitting: Cardiovascular Disease

## 2017-05-23 ENCOUNTER — Other Ambulatory Visit: Payer: Self-pay | Admitting: Urology

## 2017-05-23 VITALS — BP 160/72 | HR 64 | Wt 128.0 lb

## 2017-05-23 DIAGNOSIS — Z7901 Long term (current) use of anticoagulants: Secondary | ICD-10-CM | POA: Diagnosis not present

## 2017-05-23 DIAGNOSIS — A4151 Sepsis due to Escherichia coli [E. coli]: Secondary | ICD-10-CM

## 2017-05-23 DIAGNOSIS — I480 Paroxysmal atrial fibrillation: Secondary | ICD-10-CM | POA: Diagnosis not present

## 2017-05-23 DIAGNOSIS — I10 Essential (primary) hypertension: Secondary | ICD-10-CM

## 2017-05-23 MED ORDER — METOPROLOL SUCCINATE ER 50 MG PO TB24: 50.0000 mg | ORAL_TABLET | Freq: Every day | ORAL | 3 refills | Status: DC

## 2017-05-23 MED ORDER — AMLODIPINE BESYLATE 10 MG PO TABS: 10.0000 mg | ORAL_TABLET | Freq: Every day | ORAL | 3 refills | Status: DC

## 2017-05-23 MED ORDER — RIVAROXABAN 20 MG PO TABS: 20.0000 mg | ORAL_TABLET | Freq: Every day | ORAL | 1 refills | Status: DC

## 2017-05-23 MED ORDER — HYDROCHLOROTHIAZIDE 12.5 MG PO CAPS: 12.5000 mg | ORAL_CAPSULE | ORAL | 3 refills | Status: DC

## 2017-05-23 NOTE — Telephone Encounter (Signed)
New message     Needs call back today,      [facility] Pre-operative Risk Assessment    Request for surgical clearance:  1. What type of surgery is being performed? Extra toreal shockwave lipotripsy  2. When is this surgery scheduled?  Monday    3. Are there any medications that need to be held prior to surgery and how long?  xarelto 72 hours starting today   4. Name of physician performing surgery?  Dr Pilar Jarvis  5. What is your office phone and fax number?  [phone] ext [phone]  fax [phone]   Howie Ill 05/23/2017, 3:40 PM  _________________________________________________________________   (provider comments below)

## 2017-05-23 NOTE — Patient Instructions (Addendum)
Dr Sallyanne Kuster has recommended making the following medication changes: 1. INCREASE Hydrochlorothiazide to 1 tablet by mouth FOUR times weekly - Tuesdays, Thursdays, Saturdays, and Sundays  Your physician recommends that you schedule a follow-up appointment in 6 months. You will receive a reminder letter in the mail two months in advance. If you don't receive a letter, please call our office to schedule the follow-up appointment.  If you need a refill on your cardiac medications before your next appointment, please call your pharmacy.

## 2017-05-23 NOTE — Telephone Encounter (Signed)
Returned the call to [place] at [facility]. Per Dr. Sallyanne Kuster the patient may hold xarelto for 72 hours prior to the lipotripsy . A letter will be faxed to [facility] with the written instructions by Dr. Victorino December assistant.

## 2017-05-23 NOTE — Progress Notes (Signed)
Patient ID: Nancy Hayes, female   DOB: [birth year], 83 y.o.   MRN: [id number]    Cardiology Office Note    Date:  05/23/2017   ID:  Nancy Hayes, DOB May 21, 1927, MRN [id number]  PCP:  Aletha Halim., PA-C  Cardiologist:   Sanda Klein, MD   No chief complaint on file.   History of Present Illness:  Nancy Hayes is a 83 y.o. female with mild systemic hypertension and symptomatic premature atrial contractions who returns in follow-up, Roughly 6 weeks after being hospitalized with a urinary tract infection, complicated by sepsis and atrial fibrillation rapid ventricular response. Coagulation was started and she converted back to normal rhythm prior to hospital discharge. Her antihypertensive medications were gradually reintroduced as her blood pressure recovered after the urosepsis episode.  She is again accompanied by her son, Nancy Hayes.  They have been keeping a close eye on her blood pressure. The lowest recorded blood pressure was 136/61 mmHg, the highest 160/92. Most of the time her systolic blood pressure is over 150.   The patient specifically denies any chest pain at rest exertion, dyspnea at rest or with exertion, orthopnea, paroxysmal nocturnal dyspnea, syncope, palpitations, focal neurological deficits, intermittent claudication, lower extremity edema, unexplained weight gain, cough, hemoptysis or wheezing. She has not had any focal neurological events and denies any bleeding problems since starting Xarelto. She is still taking aspirin low-dose.  There is suspicion that her episode of urosepsis was related to a retained kidney stone. She has a planned CT scan and then follow-up with her urologist, Dr. Louis Hayes.   Past Medical History:  Diagnosis Date  . Arthritis   . Dizzy spells   . Gout   . Hypertension   . Macular degeneration of both eyes   . Renal disorder   . UTI (lower urinary tract infection)   . Vertigo   . Vision abnormalities     Past Surgical History:    Procedure Laterality Date  . CATARACT EXTRACTION    . PARTIAL HYSTERECTOMY      Outpatient Medications Prior to Visit  Medication Sig Dispense Refill  . aspirin EC 81 MG tablet Take 81 mg by mouth daily.    . Calcium Carbonate-Vitamin D (CALCIUM-D PO) Take 1,200 mg by mouth daily.    . Multiple Vitamins-Minerals (PRESERVISION AREDS 2) CAPS Take 2 capsules by mouth daily.    . Omega-3 Fatty Acids (FISH OIL) 1000 MG CAPS Take 1,000 mg by mouth daily.    . polyvinyl alcohol (ARTIFICIAL TEARS) 1.4 % ophthalmic solution Place 1 drop into both eyes 2 (two) times daily.    Marland Kitchen amLODipine (NORVASC) 10 MG tablet Take 1 tablet (10 mg total) by mouth daily. NOTE NEW DOSE 30 tablet 2  . hydrochlorothiazide (MICROZIDE) 12.5 MG capsule Take 1 capsule (12.5 mg total) by mouth 3 (three) times a week. 15 capsule 3  . metoprolol succinate (TOPROL-XL) 50 MG 24 hr tablet Take 50 mg by mouth daily.     . rivaroxaban (XARELTO) 20 MG TABS tablet Take 1 tablet (20 mg total) by mouth daily with supper. 30 tablet 0  . cefpodoxime (VANTIN) 200 MG tablet Take 2 tablets (400 mg total) by mouth 2 (two) times daily. (Patient not taking: Reported on 05/23/2017) 28 tablet 0  . magnesium oxide (MAG-OX) 400 MG tablet Take 1 tablet (400 mg total) by mouth daily. (Patient not taking: Reported on 05/23/2017) 30 tablet 0   No facility-administered medications prior to visit.  Allergies:   Patient has no known allergies.   Social History   Social History  . Marital status: Married    Spouse name: N/A  . Number of children: N/A  . Years of education: N/A   Social History Main Topics  . Smoking status: Never Smoker  . Smokeless tobacco: Never Used  . Alcohol use No  . Drug use: No  . Sexual activity: Not on file   Other Topics Concern  . Not on file   Social History Narrative  . No narrative on file     Family History:  The patient's family history includes Hypertension in her mother; Stroke in her sister.    ROS:   Please see the history of present illness.    ROS All other systems reviewed and are negative.   PHYSICAL EXAM:   VS:  BP (!) 160/72   Pulse 64   Wt 128 lb (58.1 kg)   BMI 20.66 kg/m     General: Alert, oriented x3, no distress, very lean, somewhat frail-appearing Head: no evidence of trauma, PERRL, EOMI, no exophtalmos or lid lag, no myxedema, no xanthelasma; normal ears, nose and oropharynx Neck: normal jugular venous pulsations and no hepatojugular reflux; brisk carotid pulses without delay and no carotid bruits Chest: clear to auscultation, no signs of consolidation by percussion or palpation, normal fremitus, symmetrical and full respiratory excursions Cardiovascular: normal position and quality of the apical impulse, regular rhythm with occasional ectopic beats, normal first and second heart sounds, no murmurs, rubs or gallops Abdomen: no tenderness or distention, no masses by palpation, no abnormal pulsatility or arterial bruits, normal bowel sounds, no hepatosplenomegaly Extremities: no clubbing, cyanosis or edema; 2+ radial, ulnar and brachial pulses bilaterally; 2+ right femoral, posterior tibial and dorsalis pedis pulses; 2+ left femoral, posterior tibial and dorsalis pedis pulses; no subclavian or femoral bruits Neurological: grossly nonfocal Psych: Normal mood and affect   Wt Readings from Last 3 Encounters:  05/23/17 128 lb (58.1 kg)  04/06/17 137 lb 8 oz (62.4 kg)  10/18/16 133 lb (60.3 kg)      Studies/Labs Reviewed:   EKG:  EKG is not ordered today.    BMET    Component Value Date/Time   NA 135 04/12/2017 0348   K 4.4 04/12/2017 0348   CL 106 04/12/2017 0348   CO2 24 04/12/2017 0348   GLUCOSE 119 (H) 04/12/2017 0348   BUN 8 04/12/2017 0348   CREATININE 0.62 04/12/2017 0348   CALCIUM 8.9 04/12/2017 0348   GFRNONAA >60 04/12/2017 0348   GFRAA >60 04/12/2017 0348   Lipid Panel  No results found for: CHOL, TRIG, HDL, CHOLHDL, VLDL, LDLCALC,  LDLDIRECT   ASSESSMENT:    1. Paroxysmal atrial fibrillation (HCC)   2. Long term current use of anticoagulant   3. Essential hypertension   4. Sepsis due to Escherichia coli [facility])      PLAN:  In order of problems listed above:  1. Afib: No recurrence clinically since hospital discharge. For the time being the safest approach is to continue anticoagulation for stroke prevention. If atrial fibrillation does not recur and a long period of time may consider discontinuing anticoagulation, attributing the acute events to the urosepsis. Continue beta blocker.  2. Xarelto: Stop aspirin while on Xarelto. 3. HTN: Her blood pressure is a little too high. We'll increase thiazide diuretic to 4 days weekly. 4. Recent urosepsis/possible nephrolithiasis: She would be at low risk for major cardiac complications with lithotripsy, if this  is necessary. Recommend discontinuing the Xarelto 48 hours before.    Medication Adjustments/Labs and Tests Ordered: Current medicines are reviewed at length with the patient today.  Concerns regarding medicines are outlined above.  Medication changes, Labs and Tests ordered today are listed in the Patient Instructions below. Patient Instructions  Dr Sallyanne Kuster recommends that you schedule a follow-up appointment in 6 months. You will receive a reminder letter in the mail two months in advance. If you don't receive a letter, please call our office to schedule the follow-up appointment.  If you need a refill on your cardiac medications before your next appointment, please call your pharmacy.      Signed, Sanda Klein, MD  05/23/2017 1:30 PM    [facility] [address], [place], [place]  [address] Phone: [phone]; Fax: [phone]

## 2017-05-26 ENCOUNTER — Ambulatory Visit (HOSPITAL_COMMUNITY)
Admission: RE | Admit: 2017-05-26 | Discharge: 2017-05-26 | Disposition: A | Discharge status: Home / Self Care | Payer: Medicare Other | Source: Ambulatory Visit | Attending: Urology | Admitting: Urology

## 2017-05-26 ENCOUNTER — Encounter (HOSPITAL_COMMUNITY): Payer: Self-pay | Admitting: *Deleted

## 2017-05-26 ENCOUNTER — Ambulatory Visit (HOSPITAL_COMMUNITY): Payer: Medicare Other

## 2017-05-26 ENCOUNTER — Encounter (HOSPITAL_COMMUNITY)
Admission: RE | Disposition: A | Discharge status: Home / Self Care | Payer: Self-pay | Source: Ambulatory Visit | Attending: Urology

## 2017-05-26 DIAGNOSIS — Z7982 Long term (current) use of aspirin: Secondary | ICD-10-CM | POA: Diagnosis not present

## 2017-05-26 DIAGNOSIS — Z87440 Personal history of urinary (tract) infections: Secondary | ICD-10-CM | POA: Insufficient documentation

## 2017-05-26 DIAGNOSIS — Z79899 Other long term (current) drug therapy: Secondary | ICD-10-CM | POA: Insufficient documentation

## 2017-05-26 DIAGNOSIS — Z7901 Long term (current) use of anticoagulants: Secondary | ICD-10-CM | POA: Insufficient documentation

## 2017-05-26 DIAGNOSIS — I10 Essential (primary) hypertension: Secondary | ICD-10-CM | POA: Diagnosis not present

## 2017-05-26 DIAGNOSIS — K5900 Constipation, unspecified: Secondary | ICD-10-CM | POA: Insufficient documentation

## 2017-05-26 DIAGNOSIS — N202 Calculus of kidney with calculus of ureter: Secondary | ICD-10-CM | POA: Diagnosis not present

## 2017-05-26 DIAGNOSIS — N201 Calculus of ureter: Secondary | ICD-10-CM

## 2017-05-26 DIAGNOSIS — N390 Urinary tract infection, site not specified: Secondary | ICD-10-CM | POA: Insufficient documentation

## 2017-05-26 DIAGNOSIS — Z01818 Encounter for other preprocedural examination: Secondary | ICD-10-CM | POA: Diagnosis not present

## 2017-05-26 DIAGNOSIS — E7800 Pure hypercholesterolemia, unspecified: Secondary | ICD-10-CM | POA: Insufficient documentation

## 2017-05-26 HISTORY — DX: Unspecified atrial fibrillation: I48.91

## 2017-05-26 HISTORY — PX: EXTRACORPOREAL SHOCK WAVE LITHOTRIPSY: SHX1557

## 2017-05-26 HISTORY — DX: Personal history of urinary calculi: Z87.442

## 2017-05-26 HISTORY — DX: Other shoulder lesions, unspecified shoulder: M75.80

## 2017-05-26 SURGERY — LITHOTRIPSY, ESWL
Anesthesia: LOCAL | Laterality: Right

## 2017-05-26 MED ORDER — CIPROFLOXACIN HCL 500 MG PO TABS
500.0000 mg | ORAL_TABLET | ORAL | Status: AC
Start: 2017-05-26 — End: 2017-05-26
  Administered 2017-05-26: 500 mg via ORAL
  Filled 2017-05-26: qty 1

## 2017-05-26 MED ORDER — SODIUM CHLORIDE 0.9 % IV SOLN
INTRAVENOUS | Status: DC
  Administered 2017-05-26: 11:00:00 via INTRAVENOUS

## 2017-05-26 MED ORDER — DIPHENHYDRAMINE HCL 25 MG PO CAPS
25.0000 mg | ORAL_CAPSULE | ORAL | Status: AC
  Administered 2017-05-26: 25 mg via ORAL
  Filled 2017-05-26: qty 1

## 2017-05-26 MED ORDER — DIAZEPAM 5 MG PO TABS
5.0000 mg | ORAL_TABLET | Freq: Once | ORAL | Status: AC
  Administered 2017-05-26: 5 mg via ORAL
  Filled 2017-05-26: qty 1

## 2017-05-26 NOTE — H&P (Signed)
CC: recurrent urinary tract infections  HPI: Nancy Hayes is a 83 year-old female established patient who is here today for further evaluation of recurrent UTIs.  She states that she has urinary frequency daytime and nighttime. She gets up 4-5 times a night. She does have a history of recurrence UTI's. She was prescribed cipro BID for 10 days last month. She was to the ER on 04/06/17 for this. She then followed up at [facility] family practice. She had noticed visible blood in her urine on 3 occasions since her hospital visit.     Over the last year, the patient has been diagnosed with 3 urinary tract infections. She has been on antibiotics 3 times for treatment of her infections over the past year. The past urine cultures have grown e.coli 8/5 (treated with Vantin), staph 8/17, pseudomonas 9/4 (treated with Cipro). The patient's symptoms did not improve following treatment.   In the past the patient has not taken suppressive abx. The patient is not currently taking suppressive antibiotics. She is not also taking cranberry tablets. The patient is not taking a probiotic. She has not had any imaging of their kidneys over the last 12 months.   She feels as if she does not empty her bladder with each void. She does urinate more frequently than once every 2 hours in the daytime. She does not have burning or discomfort when she urinates. The patient has noted blood in her urine. She does have frequency, urgency, and urinary incontinence.   The patient is having urinary incontinence. She leaks when she coughs, laughs, sneezes or bears down. She wears 1-2 pads per day.   The patient has a history of constipation.     CC: I have ureteral stone.  HPI: 05/23/17: Patient was initially seen on 9/18 for evaluation of recurrent urinary tract infections. History noted above. C.T imaging showed approximately 7 mm proximal right ureteral stone. She denies any current flank pain, dysuria, gross hematuria, or fever.  She denies any current nausea or vomiting. She does have history of mild systemic hypertension and symptomatic premature atrial contractions based on her most recent cardiology note. She was hospitalized about 6 weeks ago with a urinary tract infection, complicated by sepsis and atrial fibrillation rapid ventricular response She is currently is on Xarelto. Her cardiologist is Dr. Sallyanne Kuster. Per Dr. Sallyanne Kuster the patient may hold xarelto for 72 hours prior to the lipotripsy. Most recent urine culture showed mixed growth.    The problem is on the right side. She is not currently having flank pain, back pain, groin pain, nausea, vomiting, fever or chills. Pain is occuring on the right side.     ALLERGIES: No Allergies    MEDICATIONS: Hydrochlorothiazide 12.5 mg tablet  Metoprolol Succinate 50 mg tablet, extended release 24 hr  Adult Aspirin Low Strength 81 MG TBDP Oral  Amlodipine Besylate 10 mg tablet  Calcium + D TABS Oral  Calcium + D3  Cedax CAPS Oral  Ecotrin 81 mg tablet, delayed release  Evista TABS Oral  Fish Oil  GNP Fish Oil CAPS Oral  HydroCHLOROthiazide TABS Oral  Macrodantin 50 mg capsule 1 capsule PO Q HS  Ocuvite TABS Oral  Preservision Areds  Xarelto 20 mg tablet     GU PSH: Hysterectomy Unilat SO - 2011      PSH Notes: Hysterectomy   NON-GU PSH: None   GU PMH: Urinary Tract Inf, Unspec site - 05/20/2017 Hydronephrosis Unspec, Hydronephrosis On The Right - 2014 Mixed incontinence, Urge and  stress incontinence - 2014 Ureteral calculus, Distal Ureteral Stone On The Right - 2014      PMH Notes: heart murmur   1898-09-02 00:00:00 - Note: Normal Routine History And Physical Geriatric (80 +)  2009-11-02 15:29:52 - Note: No Medical Problems   NON-GU PMH: Atrial Fibrillation Gout Hypercholesterolemia Hypertension    FAMILY HISTORY: 1 son - Son Breast Cancer - Sister Congestive Heart Failure - Mother Death In The Family Father - Sister Death In The Family  Mother - Sister Family Health Status Number - Sister Hypertension - Runs in Family nephrolithiasis - Brother Stroke Syndrome - Brother, Sister, Mother   SOCIAL HISTORY: Marital Status: Widowed Preferred Language: English; Ethnicity: Not Hispanic Or Latino; Race: White Current Smoking Status: Patient has never smoked.   Tobacco Use Assessment Completed: Used Tobacco in last 30 days? Does not drink caffeine. Patient's occupation is/was Retired.     Notes: Caffeine Use, Occupation:, Tobacco Use, Marital History - Currently Married, Alcohol Use   REVIEW OF SYSTEMS:    GU Review Female:   Patient reports frequent urination, hard to postpone urination, get up at night to urinate, and leakage of urine. Patient denies burning /pain with urination, stream starts and stops, trouble starting your stream, have to strain to urinate, and being pregnant.  Gastrointestinal (Upper):   Patient denies nausea, vomiting, and indigestion/ heartburn.  Gastrointestinal (Lower):   Patient denies diarrhea and constipation.  Constitutional:   Patient denies fever, night sweats, weight loss, and fatigue.  Skin:   Patient denies skin rash/ lesion and itching.  Eyes:   Patient denies blurred vision and double vision.  Ears/ Nose/ Throat:   Patient denies sore throat and sinus problems.  Hematologic/Lymphatic:   Patient denies swollen glands and easy bruising.  Cardiovascular:   Patient denies leg swelling and chest pains.  Respiratory:   Patient denies cough and shortness of breath.  Endocrine:   Patient denies excessive thirst.  Musculoskeletal:   Patient denies back pain and joint pain.  Neurological:   Patient denies headaches and dizziness.  Psychologic:   Patient denies depression and anxiety.   VITAL SIGNS:      05/23/2017 02:22 PM  Weight 128 lb / 58.06 kg  Height 64 in / 162.56 cm  BP 166/69 mmHg  Pulse 69 /min  Temperature 97.5 F / 36.3 C  BMI 22.0 kg/m   GU PHYSICAL EXAMINATION:    External  Genitalia: Atrophic introitus. No hirsuitism, no rash, no scarring, no cyst, no erythematous lesion, no papular lesion, no blanched lesion, no warty lesion, no labial adhesions. No edema.   Urethral Meatus: Urethral caruncle. Normal size. Normal position. No discharge.   Urethra: No tenderness, no mass, no scarring. No hypermobility. No leakage.  Bladder: Normal to palpation, no tenderness, no mass, normal size.  Vagina: Moderate vaginal atrophy. No stenosis. No rectocele. No cystocele. No enterocele.   Cervix: S/P Hysterectomy  Uterus: S/P Hysterectomy   MULTI-SYSTEM PHYSICAL EXAMINATION:    Constitutional: Well-nourished. No physical deformities. Normally developed. Good grooming.  Respiratory: Normal breath sounds. No labored breathing, no use of accessory muscles.   Cardiovascular: Regular rate and rhythm with occasional ectopic beats. No murmur, no gallop. Normal temperature, no swelling, no varicosities.   Skin: No paleness, no jaundice, no cyanosis. No lesion, no ulcer, no rash.  Neurologic / Psychiatric: Oriented to time, oriented to place, oriented to person. No depression, no anxiety, no agitation.  Gastrointestinal: No mass, no tenderness, no rigidity, non obese abdomen.  Musculoskeletal:  Spine, ribs, pelvis no bilateral tenderness. Normal gait and station of head and neck.     PAST DATA REVIEWED:  Source Of History:  Patient  Lab Test Review:   BMP  Records Review:   Previous Patient Records  Urine Test Review:   Urinalysis, Urine Culture  X-Ray Review: C.T. Abdomen/Pelvis: Reviewed Films.    Notes:                     Creatinine on 8/11- 0.62   PROCEDURES:         KUB - 74018  A single view of the abdomen is obtained.   Bony Abnormalities:  Scoliosis noted.  Fecal Stasis:  Normal bowel gas pattern  Calculi:  7 mm Proximal right ureter calculi noted.                Catheter / SP Tube - 51701 In and Out Catheterization  A 14 French red rubber or straight catheter  was inserted into the bladder using sterile technique. A urine culture was sent to the lab. A urinalysis was sent to the lab.         Urinalysis w/Scope Dipstick Dipstick Cont'd Micro  Color: Orange Bilirubin: Neg WBC/hpf: 6 - 10/hpf  Appearance: Clear Ketones: Neg RBC/hpf: >60/hpf  Specific Gravity: 1.025 Blood: 3+ Bacteria: Rare (0-9/hpf)  pH: 6.5 Protein: Neg Cystals: NS (Not Seen)  Glucose: Neg Urobilinogen: 1.0 Casts: NS (Not Seen)    Nitrites: Neg Trichomonas: Not Present    Leukocyte Esterase: Neg Mucous: Not Present      Epithelial Cells: 0 - 5/hpf      Yeast: NS (Not Seen)      Sperm: Not Present    ASSESSMENT:      ICD-10 Details  1 GU:   Ureteral calculus - N20.1 Stable  2   Urinary Tract Inf, Unspec site - N39.0    PLAN:            Medications New Meds: Augmentin 875 mg-125 mg tablet 1 tablet PO Q 12 H   #14  0 Refill(s)            Orders  Labs Urine Culture          Document Letter(s):  Created for Patient: Clinical Summary    1. Right ureteral stone -Right ESWL

## 2017-06-02 ENCOUNTER — Encounter (HOSPITAL_COMMUNITY): Payer: Self-pay | Admitting: Urology

## 2017-09-24 ENCOUNTER — Other Ambulatory Visit: Payer: Self-pay | Admitting: Cardiovascular Disease

## 2017-10-02 DIAGNOSIS — H26492 Other secondary cataract, left eye: Secondary | ICD-10-CM | POA: Insufficient documentation

## 2017-10-20 DIAGNOSIS — R058 Other specified cough: Secondary | ICD-10-CM | POA: Insufficient documentation

## 2017-11-25 ENCOUNTER — Telehealth: Payer: Self-pay | Admitting: Cardiovascular Disease

## 2017-11-25 MED ORDER — RIVAROXABAN 20 MG PO TABS: ORAL_TABLET | ORAL | 1 refills | Status: DC

## 2017-11-25 NOTE — Telephone Encounter (Signed)
°*  STAT* If patient is at the pharmacy, call can be transferred to refill team.   1. Which medications need to be refilled? (please list name of each medication and dose if known) XARELTO  20mg   2. Which pharmacy/location (including street and city if local pharmacy) is medication to be sent to? [facility]   3. Do they need a 30 day or 90 day supply? Cash

## 2017-11-26 ENCOUNTER — Other Ambulatory Visit: Payer: Self-pay | Admitting: Gastroenterology

## 2017-11-27 ENCOUNTER — Telehealth: Payer: Self-pay

## 2017-11-27 ENCOUNTER — Other Ambulatory Visit: Payer: Self-pay

## 2017-11-27 ENCOUNTER — Encounter (HOSPITAL_COMMUNITY): Payer: Self-pay

## 2017-11-27 NOTE — Telephone Encounter (Signed)
   [facility] Pre-operative Risk Assessment    Request for surgical clearance:  1. What type of surgery is being performed? EGD with dilation   2. When is this surgery scheduled? 12/05/17  3. What type of clearance is required (medical clearance vs. Pharmacy clearance to hold med vs. Both)? Pharmacy  4. Are there any medications that need to be held prior to surgery and how long? Xarelto  5. Practice name and name of physician performing surgery? [facility]  6. What is your office phone and fax number? Phone # [phone]  Fax # [phone]  7. Anesthesia type (None, local, MAC, general) ? unknown   Kathyrn Lass 11/27/2017, 12:19 PM  _________________________________________________________________   (provider comments below)

## 2017-11-27 NOTE — Progress Notes (Signed)
Pt. Waiting on instructions for stopping Xarelto prior to EGD. Per Cardiologist clearance.pt. Aware of this at preop call.

## 2017-11-28 NOTE — Telephone Encounter (Signed)
Pt takes Xarelto for afib with CHADS2VASc score of 4 (age x2, sex, HTN). Renal function is normal. Recommend holding Xarelto for 1-2 days prior to procedure as needed.

## 2017-11-28 NOTE — Telephone Encounter (Signed)
Pt seen in Sept- no history of CAD or MI. She is on Xarelto for PAF. Will ask pharmacy to make formal recommendations about holding Xarelto, then call the patient. If she has no new symptoms since Sept she should be cleared for her procedure.  Kerin Ransom PA-C 11/28/2017 4:09 PM

## 2017-12-01 NOTE — Telephone Encounter (Signed)
   Primary Cardiologist: Sanda Klein, MD  Chart reviewed as part of pre-operative protocol coverage. Patient was contacted 12/01/2017 in reference to pre-operative risk assessment for pending surgery as outlined below.  Nancy Hayes was last seen on 05/23/2017 by Dr. Sallyanne Kuster.  Since that day, Nancy Hayes has done well. She walks around with a cane but denies any CP or SOB.   Therefore, based on ACC/AHA guidelines, the patient would be at acceptable risk for the planned procedure without further cardiovascular testing.   I will route this recommendation to the requesting party via Epic fax function and remove from pre-op pool.  Please call with questions. She has been instructed to hold Xarelto for 2 days prior to the procedure and restart as soon as possible at the discretion of Dr. Sanjuan Dame, [place] 12/01/2017, 6:25 PM

## 2017-12-05 ENCOUNTER — Ambulatory Visit (HOSPITAL_COMMUNITY): Payer: Medicare Other | Admitting: Anesthesiology

## 2017-12-05 ENCOUNTER — Other Ambulatory Visit: Payer: Self-pay

## 2017-12-05 ENCOUNTER — Encounter (HOSPITAL_COMMUNITY): Payer: Self-pay | Admitting: Anesthesiology

## 2017-12-05 ENCOUNTER — Encounter (HOSPITAL_COMMUNITY)
Admission: RE | Disposition: A | Discharge status: Home / Self Care | Payer: Self-pay | Source: Ambulatory Visit | Attending: Gastroenterology

## 2017-12-05 ENCOUNTER — Ambulatory Visit (HOSPITAL_COMMUNITY)
Admission: RE | Admit: 2017-12-05 | Discharge: 2017-12-05 | Disposition: A | Discharge status: Home / Self Care | Payer: Medicare Other | Source: Ambulatory Visit | Attending: Gastroenterology | Admitting: Gastroenterology

## 2017-12-05 DIAGNOSIS — M109 Gout, unspecified: Secondary | ICD-10-CM | POA: Insufficient documentation

## 2017-12-05 DIAGNOSIS — Z79899 Other long term (current) drug therapy: Secondary | ICD-10-CM | POA: Insufficient documentation

## 2017-12-05 DIAGNOSIS — I10 Essential (primary) hypertension: Secondary | ICD-10-CM | POA: Diagnosis not present

## 2017-12-05 DIAGNOSIS — K449 Diaphragmatic hernia without obstruction or gangrene: Secondary | ICD-10-CM | POA: Insufficient documentation

## 2017-12-05 DIAGNOSIS — K222 Esophageal obstruction: Secondary | ICD-10-CM | POA: Insufficient documentation

## 2017-12-05 DIAGNOSIS — R1310 Dysphagia, unspecified: Secondary | ICD-10-CM | POA: Diagnosis present

## 2017-12-05 DIAGNOSIS — Z7901 Long term (current) use of anticoagulants: Secondary | ICD-10-CM | POA: Diagnosis not present

## 2017-12-05 DIAGNOSIS — M1990 Unspecified osteoarthritis, unspecified site: Secondary | ICD-10-CM | POA: Insufficient documentation

## 2017-12-05 DIAGNOSIS — I4891 Unspecified atrial fibrillation: Secondary | ICD-10-CM | POA: Insufficient documentation

## 2017-12-05 HISTORY — PX: BALLOON DILATION: SHX5330

## 2017-12-05 HISTORY — DX: Cardiac arrhythmia, unspecified: I49.9

## 2017-12-05 HISTORY — PX: ESOPHAGOGASTRODUODENOSCOPY (EGD) WITH PROPOFOL: SHX5813

## 2017-12-05 SURGERY — ESOPHAGOGASTRODUODENOSCOPY (EGD) WITH PROPOFOL
Anesthesia: Monitor Anesthesia Care

## 2017-12-05 MED ORDER — PROPOFOL 10 MG/ML IV BOLUS
INTRAVENOUS | Status: AC
  Filled 2017-12-05: qty 20

## 2017-12-05 MED ORDER — PROPOFOL 500 MG/50ML IV EMUL
INTRAVENOUS | Status: DC | PRN
  Administered 2017-12-05: 30 mg via INTRAVENOUS
  Administered 2017-12-05: 20 mg via INTRAVENOUS

## 2017-12-05 MED ORDER — FENTANYL CITRATE (PF) 100 MCG/2ML IJ SOLN
INTRAMUSCULAR | Status: AC
  Filled 2017-12-05: qty 2

## 2017-12-05 MED ORDER — ONDANSETRON HCL 4 MG/2ML IJ SOLN
INTRAMUSCULAR | Status: DC | PRN
  Administered 2017-12-05: 4 mg via INTRAVENOUS

## 2017-12-05 MED ORDER — SODIUM CHLORIDE 0.9 % IV SOLN: INTRAVENOUS | Status: DC

## 2017-12-05 MED ORDER — PROPOFOL 500 MG/50ML IV EMUL
INTRAVENOUS | Status: DC | PRN
  Administered 2017-12-05: 100 ug/kg/min via INTRAVENOUS

## 2017-12-05 MED ORDER — LACTATED RINGERS IV SOLN
INTRAVENOUS | Status: DC
  Administered 2017-12-05: 12:00:00 via INTRAVENOUS

## 2017-12-05 SURGICAL SUPPLY — 15 items

## 2017-12-05 NOTE — Transfer of Care (Signed)
Immediate Anesthesia Transfer of Care Note  Patient: Nancy Hayes  Procedure(s) Performed: ESOPHAGOGASTRODUODENOSCOPY (EGD) WITH PROPOFOL (N/A ) MALONEY DILATION (N/A )  Patient Location: PACU  Anesthesia Type:MAC  Level of Consciousness: awake, alert  and oriented  Airway & Oxygen Therapy: Patient Spontanous Breathing and Patient connected to face mask oxygen  Post-op Assessment: Report given to RN  Post vital signs: Reviewed and stable  Last Vitals:  Vitals Value Taken Time  BP    Temp    Pulse    Resp    SpO2      Last Pain:  Vitals:   12/05/17 1059  TempSrc: Oral  PainSc:          Complications: No apparent anesthesia complications

## 2017-12-05 NOTE — H&P (Signed)
  Nancy Hayes HPI: The patient reports an increase in frequency with a choking sensation with PO intake. In the past she had 1-2 episodes per year, but within the past week she reports two episodes of choking. Both solids and liquids give her problems, but there is a solid food predominance. She feels as if she has GERD type of symptoms and bending over after PO intake can induce regurgitation. With these dysphagia episodes she does induce vomiting to feel better. There is no report of hematemesis. Currently she uses xarelto for her afib and Dr. Sallyanne Kuster manages her anticoagulation. She denies any prior history of an EGD.   Past Medical History:  Diagnosis Date  . AC (acromioclavicular) joint bone spurs    neck  . Arthritis   . Atrial fibrillation ([place])   . Dizzy spells   . Dysrhythmia   . Gout   . History of kidney stones   . Hypertension   . Macular degeneration of both eyes    right eye  . Renal disorder    pt denies  . UTI (lower urinary tract infection)   . Vertigo   . Vision abnormalities     Past Surgical History:  Procedure Laterality Date  . CATARACT EXTRACTION    . EXTRACORPOREAL SHOCK WAVE LITHOTRIPSY Right 05/26/2017   Procedure: EXTRACORPOREAL SHOCK WAVE LITHOTRIPSY (ESWL);  Surgeon: Nickie Retort, MD;  Location: [facility]S;  Service: Urology;  Laterality: Right;  . PARTIAL HYSTERECTOMY     pt states total hysterectomy    Family History  Problem Relation Age of Onset  . Hypertension Mother   . Stroke Sister     Social History:  reports that she has never smoked. She has never used smokeless tobacco. She reports that she does not drink alcohol or use drugs.  Allergies: No Known Allergies  Medications:  Scheduled:  Continuous: . sodium chloride    . lactated ringers      No results found for this or any previous visit (from the past 24 hour(s)).   No results found.  ROS:  As stated above in the HPI otherwise negative.  Blood pressure (!)  173/69, pulse 75, temperature 98.2 F (36.8 C), temperature source Oral, resp. rate 12, height 5\' 4"  (1.626 m), weight 56.7 kg (125 lb), SpO2 97 %.    PE: Gen: NAD, Alert and Oriented HEENT:  [place]/AT, EOMI Neck: Supple, no LAD Lungs: CTA Bilaterally CV: RRR without M/G/R ABM: Soft, NTND, +BS Ext: No C/C/E  Assessment/Plan: 1) Dysphagia - EGD with dilation.  Radhika Dershem D 12/05/2017, 11:10 AM

## 2017-12-05 NOTE — Op Note (Signed)
[facility] Patient Name: Nancy Hayes Procedure Date: 12/05/2017 MRN: [id number] Attending MD: Carol Ada , MD Date of Birth: Jul 12, 1927 CSN: [id number] Age: 84 Admit Type: Outpatient Procedure:                Upper GI endoscopy Indications:              Dysphagia Providers:                Carol Ada, MD, Cleda Daub, RN, Charolette Child,                            Technician, Arnoldo Hooker, CRNA Referring MD:              Medicines:                Propofol per Anesthesia Complications:            No immediate complications. Estimated Blood Loss:     Estimated blood loss was minimal. Procedure:                Pre-Anesthesia Assessment:                           - Prior to the procedure, a History and Physical                            was performed, and patient medications and                            allergies were reviewed. The patient's tolerance of                            previous anesthesia was also reviewed. The risks                            and benefits of the procedure and the sedation                            options and risks were discussed with the patient.                            All questions were answered, and informed consent                            was obtained. Prior Anticoagulants: The patient has                            taken Xarelto (rivaroxaban), last dose was 4 days                            prior to procedure. ASA Grade Assessment: III - A                            patient with severe systemic disease. After  reviewing the risks and benefits, the patient was                            deemed in satisfactory condition to undergo the                            procedure.                           - Sedation was administered by an anesthesia                            professional. Deep sedation was attained.                           After obtaining informed consent, the endoscope was               passed under direct vision. Throughout the                            procedure, the patient's blood pressure, pulse, and                            oxygen saturations were monitored continuously. The                            EG-[id number] ([id number]) scope was introduced through the                            mouth, and advanced to the second part of duodenum.                            The upper GI endoscopy was accomplished without                            difficulty. The patient tolerated the procedure                            well. Scope In: Scope Out: Findings:      One benign-appearing, intrinsic moderate stenosis was found at the       gastroesophageal junction. This stenosis measured 1 cm (inner diameter)       x less than one cm (in length). The stenosis was traversed. A TTS       dilator was passed through the scope. Dilation with a 06-13-11 mm       balloon and a 12-13.5-15 mm balloon dilator was performed to 13.5 mm.       The dilation site was examined and showed mild mucosal disruption.       Estimated blood loss was minimal.      A 4 cm hiatal hernia was present.      The stomach was normal.      The examined duodenum was normal.      Passage of the endoscope through the area was mildy difficult. Dilation       of the stricture was performed at 10 and 11 mm. At that  diameter the       balloon was able to move through the area very easily. The diameter was       increased to 12 mm and there was less movement. The balloon was held in       place for 30 seconds. A second balloon was used and dilation was       increased up to 13.5 mm with more mucosal disruption and more bleedinng       was noted. Overall the bleeding was minimal. Following the rule of 3's,       no further increase in dilation was pursued at this time. Impression:               - Benign-appearing esophageal stenosis. Dilated.                           - 4 cm hiatal hernia.                            - Normal stomach.                           - Normal examined duodenum.                           - No specimens collected. Moderate Sedation:      N/A- Per Anesthesia Care Recommendation:           - Patient has a contact number available for                            emergencies. The signs and symptoms of potential                            delayed complications were discussed with the                            patient. Return to normal activities tomorrow.                            Written discharge instructions were provided to the                            patient.                           - Resume previous diet.                           - Use a proton pump inhibitor PO daily.                           - Return to GI clinic in 4 weeks.                           - Resume Xarelto. Procedure Code(s):        --- Professional ---  [phone], Esophagogastroduodenoscopy, flexible,                            transoral; with transendoscopic balloon dilation of                            esophagus (less than 30 mm diameter) Diagnosis Code(s):        --- Professional ---                           K22.2, Esophageal obstruction                           K44.9, Diaphragmatic hernia without obstruction or                            gangrene                           R13.10, Dysphagia, unspecified CPT copyright 2017 [employer]. All rights reserved. The codes documented in this report are preliminary and upon coder review may  be revised to meet current compliance requirements. Carol Ada, MD Carol Ada, MD 12/05/2017 12:39:41 PM This report has been signed electronically. Number of Addenda: 0

## 2017-12-05 NOTE — Anesthesia Preprocedure Evaluation (Signed)
Anesthesia Evaluation  Patient identified by MRN, date of birth, ID band Patient awake    Reviewed: Allergy & Precautions, H&P , NPO status , Patient's Chart, lab work & pertinent test results  Airway Mallampati: II   Neck ROM: full    Dental   Pulmonary neg pulmonary ROS,    breath sounds clear to auscultation       Cardiovascular hypertension, + dysrhythmias Atrial Fibrillation  Rhythm:regular Rate:Normal     Neuro/Psych    GI/Hepatic   Endo/Other    Renal/GU      Musculoskeletal  (+) Arthritis ,   Abdominal   Peds  Hematology   Anesthesia Other Findings   Reproductive/Obstetrics                             Anesthesia Physical Anesthesia Plan  ASA: III  Anesthesia Plan: MAC   Post-op Pain Management:    Induction: Intravenous  PONV Risk Score and Plan: 2 and Propofol infusion and Treatment may vary due to age or medical condition  Airway Management Planned: Nasal Cannula  Additional Equipment:   Intra-op Plan:   Post-operative Plan:   Informed Consent: I have reviewed the patients History and Physical, chart, labs and discussed the procedure including the risks, benefits and alternatives for the proposed anesthesia with the patient or authorized representative who has indicated his/her understanding and acceptance.     Plan Discussed with: CRNA and Anesthesiologist  Anesthesia Plan Comments:         Anesthesia Quick Evaluation

## 2017-12-05 NOTE — Anesthesia Postprocedure Evaluation (Signed)
Anesthesia Post Note  Patient: Tyla G Hally  Procedure(s) Performed: ESOPHAGOGASTRODUODENOSCOPY (EGD) WITH PROPOFOL (N/A ) MALONEY DILATION (N/A )     Patient location during evaluation: Endoscopy Anesthesia Type: MAC Level of consciousness: awake and alert Pain management: pain level controlled Vital Signs Assessment: post-procedure vital signs reviewed and stable Respiratory status: spontaneous breathing, nonlabored ventilation, respiratory function stable and patient connected to nasal cannula oxygen Cardiovascular status: blood pressure returned to baseline and stable Postop Assessment: no apparent nausea or vomiting Anesthetic complications: no    Last Vitals:  Vitals:   12/05/17 1250 12/05/17 1255  BP: (!) 145/65 136/68  Pulse: 69 70  Resp: 19 14  Temp:    SpO2: 97% 99%    Last Pain:  Vitals:   12/05/17 1255  TempSrc:   PainSc: 0-No pain                 Aala Ransom S

## 2017-12-05 NOTE — Discharge Instructions (Signed)

## 2017-12-07 ENCOUNTER — Encounter (HOSPITAL_COMMUNITY): Payer: Self-pay | Admitting: Gastroenterology

## 2017-12-15 ENCOUNTER — Encounter: Payer: Self-pay | Admitting: Cardiovascular Disease

## 2017-12-15 ENCOUNTER — Ambulatory Visit (INDEPENDENT_AMBULATORY_CARE_PROVIDER_SITE_OTHER): Payer: Medicare Other | Admitting: Cardiovascular Disease

## 2017-12-15 VITALS — BP 136/64 | HR 64 | Ht 64.0 in | Wt 133.0 lb

## 2017-12-15 DIAGNOSIS — I10 Essential (primary) hypertension: Secondary | ICD-10-CM | POA: Diagnosis not present

## 2017-12-15 DIAGNOSIS — Z7901 Long term (current) use of anticoagulants: Secondary | ICD-10-CM

## 2017-12-15 DIAGNOSIS — I480 Paroxysmal atrial fibrillation: Secondary | ICD-10-CM | POA: Diagnosis not present

## 2017-12-15 MED ORDER — AMLODIPINE BESYLATE 10 MG PO TABS: 10.0000 mg | ORAL_TABLET | Freq: Every day | ORAL | 3 refills | Status: DC

## 2017-12-15 MED ORDER — METOPROLOL SUCCINATE ER 50 MG PO TB24: 50.0000 mg | ORAL_TABLET | Freq: Every day | ORAL | 3 refills | Status: DC

## 2017-12-15 MED ORDER — HYDROCHLOROTHIAZIDE 12.5 MG PO CAPS: 12.5000 mg | ORAL_CAPSULE | ORAL | 3 refills | Status: DC

## 2017-12-15 NOTE — Progress Notes (Signed)
Patient ID: Nancy Hayes, female   DOB: 09/23/1926, 84 y.o.   MRN: [id number]    Cardiology Office Note    Date:  12/17/2017   ID:  Nancy Hayes, DOB 25-Jul-1927, MRN [id number]  PCP:  Aletha Halim., PA-C  Cardiologist:   Sanda Klein, MD   Chief Complaint  Patient presents with  . Follow-up    6 months    History of Present Illness:  Nancy Hayes is a 84 y.o. female with mild systemic hypertension and symptomatic premature atrial contractions who returns in follow-up,   As usual, she is here with her son Chief Executive Officer.  She has generally done well.  Her blood pressure is a little bit volatile, but usually in target range.  She has not had problems with focal neurological deficits, headaches, shortness of breath or angina.  She had one very brief episode where her vision turned dark while she was walking in the house but was able to sit down and got better quickly without any loss of consciousness.  The patient specifically denies any chest pain at rest exertion, dyspnea at rest or with exertion, orthopnea, paroxysmal nocturnal dyspnea, syncope, palpitations, focal neurological deficits, intermittent claudication, lower extremity edema, unexplained weight gain, cough, hemoptysis or wheezing.  She has had problems with seasonal allergies.  Although we did not catch any arrhythmia on her ECG, she had frequent ectopy on physical exam.  It sounded like background regular rhythm with frequent PACs or PVCs.    Past Medical History:  Diagnosis Date  . AC (acromioclavicular) joint bone spurs    neck  . Arthritis   . Atrial fibrillation ([place])   . Dizzy spells   . Dysrhythmia   . Gout   . History of kidney stones   . Hypertension   . Macular degeneration of both eyes    right eye  . Renal disorder    pt denies  . UTI (lower urinary tract infection)   . Vertigo   . Vision abnormalities     Past Surgical History:  Procedure Laterality Date  . BALLOON DILATION N/A 12/05/2017   Procedure: BALLOON DILATION;  Surgeon: Carol Ada, MD;  Location: [facility];  Service: Endoscopy;  Laterality: N/A;  . CATARACT EXTRACTION    . ESOPHAGOGASTRODUODENOSCOPY (EGD) WITH PROPOFOL N/A 12/05/2017   Procedure: ESOPHAGOGASTRODUODENOSCOPY (EGD) WITH PROPOFOL;  Surgeon: Carol Ada, MD;  Location: [facility];  Service: Endoscopy;  Laterality: N/A;  . EXTRACORPOREAL SHOCK WAVE LITHOTRIPSY Right 05/26/2017   Procedure: EXTRACORPOREAL SHOCK WAVE LITHOTRIPSY (ESWL);  Surgeon: Nickie Retort, MD;  Location: [facility];  Service: Urology;  Laterality: Right;  . PARTIAL HYSTERECTOMY     pt states total hysterectomy    Outpatient Medications Prior to Visit  Medication Sig Dispense Refill  . Calcium Carbonate-Vitamin D (CALCIUM-D PO) Take 1 tablet by mouth daily.     . Cranberry 500 MG CAPS Take 1,000 mg by mouth daily.     Marland Kitchen docusate sodium (COLACE) 250 MG capsule Take 250 mg by mouth daily.    Marland Kitchen LACTOBACILLUS BIFIDUS PO Take 1 capsule by mouth daily.    . Multiple Vitamins-Minerals (PRESERVISION AREDS 2) CAPS Take 2 capsules by mouth daily.    . Omega-3 Fatty Acids (FISH OIL) 1000 MG CAPS Take 1,000 mg by mouth daily.    Marland Kitchen omeprazole (PRILOSEC) 40 MG capsule Take 40 mg by mouth daily.    . polyvinyl alcohol (ARTIFICIAL TEARS) 1.4 % ophthalmic solution Place 1 drop into  both eyes 2 (two) times daily.    . rivaroxaban (XARELTO) 20 MG TABS tablet TAKE 1 TABLET BY MOUTH  DAILY WITH SUPPER (Patient taking differently: Take 20 mg by mouth daily with supper. ) 90 tablet 1  . sulfamethoxazole-trimethoprim (BACTRIM,SEPTRA) 400-80 MG tablet Take 1 tablet by mouth 2 (two) times daily.    Marland Kitchen amLODipine (NORVASC) 10 MG tablet Take 1 tablet (10 mg total) by mouth daily. NOTE NEW DOSE 90 tablet 3  . hydrochlorothiazide (MICROZIDE) 12.5 MG capsule Take 1 capsule (12.5 mg total) by mouth 4 (four) times a week. (Patient taking differently: Take 12.5 mg by mouth See admin instructions. Take 12.5 mg by  mouth daily on Saturday, Sunday, Tuesday and Thursday.) 60 capsule 3  . metoprolol succinate (TOPROL-XL) 50 MG 24 hr tablet Take 1 tablet (50 mg total) by mouth daily. 90 tablet 3  . mirabegron ER (MYRBETRIQ) 50 MG TB24 tablet Take 50 mg by mouth daily.    . ranitidine (ZANTAC) 150 MG tablet Take 150 mg by mouth daily.     No facility-administered medications prior to visit.      Allergies:   Patient has no known allergies.   Social History   Socioeconomic History  . Marital status: Widowed    Spouse name: Not on file  . Number of children: Not on file  . Years of education: Not on file  . Highest education level: Not on file  Occupational History  . Not on file  Social Needs  . Financial resource strain: Not on file  . Food insecurity:    Worry: Not on file    Inability: Not on file  . Transportation needs:    Medical: Not on file    Non-medical: Not on file  Tobacco Use  . Smoking status: Never Smoker  . Smokeless tobacco: Never Used  Substance and Sexual Activity  . Alcohol use: No  . Drug use: No  . Sexual activity: Not Currently  Lifestyle  . Physical activity:    Days per week: Not on file    Minutes per session: Not on file  . Stress: Not on file  Relationships  . Social connections:    Talks on phone: Not on file    Gets together: Not on file    Attends religious service: Not on file    Active member of club or organization: Not on file    Attends meetings of clubs or organizations: Not on file    Relationship status: Not on file  Other Topics Concern  . Not on file  Social History Narrative  . Not on file     Family History:  The patient's family history includes Hypertension in her mother; Stroke in her sister.   ROS:   Please see the history of present illness.    ROS All other systems reviewed and are negative.   PHYSICAL EXAM:   VS:  BP 136/64 (BP Location: Left Arm, Patient Position: Sitting, Cuff Size: Normal)   Pulse 64   Ht 5\' 4"   (1.626 m)   Wt 133 lb (60.3 kg)   BMI 22.83 kg/m      General: Alert, oriented x3, no distress, appears elderly and just a little bit frail Head: no evidence of trauma, PERRL, EOMI, no exophtalmos or lid lag, no myxedema, no xanthelasma; normal ears, nose and oropharynx Neck: normal jugular venous pulsations and no hepatojugular reflux; brisk carotid pulses without delay and no carotid bruits Chest: clear to auscultation, no  signs of consolidation by percussion or palpation, normal fremitus, symmetrical and full respiratory excursions Cardiovascular: normal position and quality of the apical impulse, regular rhythm baseline with frequent ectopy, normal first and second heart sounds, no murmurs, rubs or gallops Abdomen: no tenderness or distention, no masses by palpation, no abnormal pulsatility or arterial bruits, normal bowel sounds, no hepatosplenomegaly Extremities: no clubbing, cyanosis or edema; 2+ radial, ulnar and brachial pulses bilaterally; 2+ right femoral, posterior tibial and dorsalis pedis pulses; 2+ left femoral, posterior tibial and dorsalis pedis pulses; no subclavian or femoral bruits Neurological: grossly nonfocal Psych: Normal mood and affect   Wt Readings from Last 3 Encounters:  12/15/17 133 lb (60.3 kg)  12/05/17 125 lb (56.7 kg)  05/26/17 125 lb 3.2 oz (56.8 kg)      Studies/Labs Reviewed:   EKG:  EKG is ordered today.   Shows normal sinus rhythm with nonspecific ST-ST changes, in particular slight T wave inversion in leads V5-V6.  We did not catch any ectopy on the ECG.  BMET    Component Value Date/Time   NA 135 04/12/2017 0348   K 4.4 04/12/2017 0348   CL 106 04/12/2017 0348   CO2 24 04/12/2017 0348   GLUCOSE 119 (H) 04/12/2017 0348   BUN 8 04/12/2017 0348   CREATININE 0.62 04/12/2017 0348   CALCIUM 8.9 04/12/2017 0348   GFRNONAA >60 04/12/2017 0348   GFRAA >60 04/12/2017 0348   Lipid Panel  No results found for: CHOL, TRIG, HDL, CHOLHDL, VLDL,  LDLCALC, LDLDIRECT   ASSESSMENT:    1. Paroxysmal atrial fibrillation (HCC)   2. Long term current use of anticoagulant   3. Essential hypertension      PLAN:  In order of problems listed above:  1. Afib: In normal rhythm today but with very frequent ectopic beats on exam.  I am suspicious that she may still be having problems with atrial fibrillation so I think it is best to continue her anticoagulant, especially since she is tolerating it without serious complications.  Continue beta-blocker. 2. Xarelto: Tolerating well without any bleeding complications. 3. HTN: Her blood pressure is fairly well controlled on a regimen of every other day hydrochlorothiazide and low-dose beta-blocker.  She has had some problems with dizziness/near syncope and I do not think we should increase her medications any further.  Medication Adjustments/Labs and Tests Ordered: Current medicines are reviewed at length with the patient today.  Concerns regarding medicines are outlined above.  Medication changes, Labs and Tests ordered today are listed in the Patient Instructions below. Patient Instructions  Dr Sallyanne Kuster recommends that you schedule a follow-up appointment in 12 months. You will receive a reminder letter in the mail two months in advance. If you don't receive a letter, please call our office to schedule the follow-up appointment.  If you need a refill on your cardiac medications before your next appointment, please call your pharmacy.      Signed, Sanda Klein, MD  12/17/2017 2:20 PM    [facility] [address], [place], [place]  [address] Phone: [phone]; Fax: [phone]

## 2017-12-15 NOTE — Patient Instructions (Signed)
Dr Croitoru recommends that you schedule a follow-up appointment in 12 months. You will receive a reminder letter in the mail two months in advance. If you don't receive a letter, please call our office to schedule the follow-up appointment.  If you need a refill on your cardiac medications before your next appointment, please call your pharmacy. 

## 2017-12-17 ENCOUNTER — Encounter: Payer: Self-pay | Admitting: Cardiovascular Disease

## 2018-02-16 IMAGING — DX DG CHEST 2V
2 series · 2 of 2 positions shown · non-contrast
Comparison: 10/14/2014

CLINICAL DATA: Pt reports fever today of 102, UTI dx today, some
SOB and weakness x 3-4 days; pt reports she takes meds for HTN;
non-smoker

EXAM:
CHEST  2 VIEW

[x chest ap]
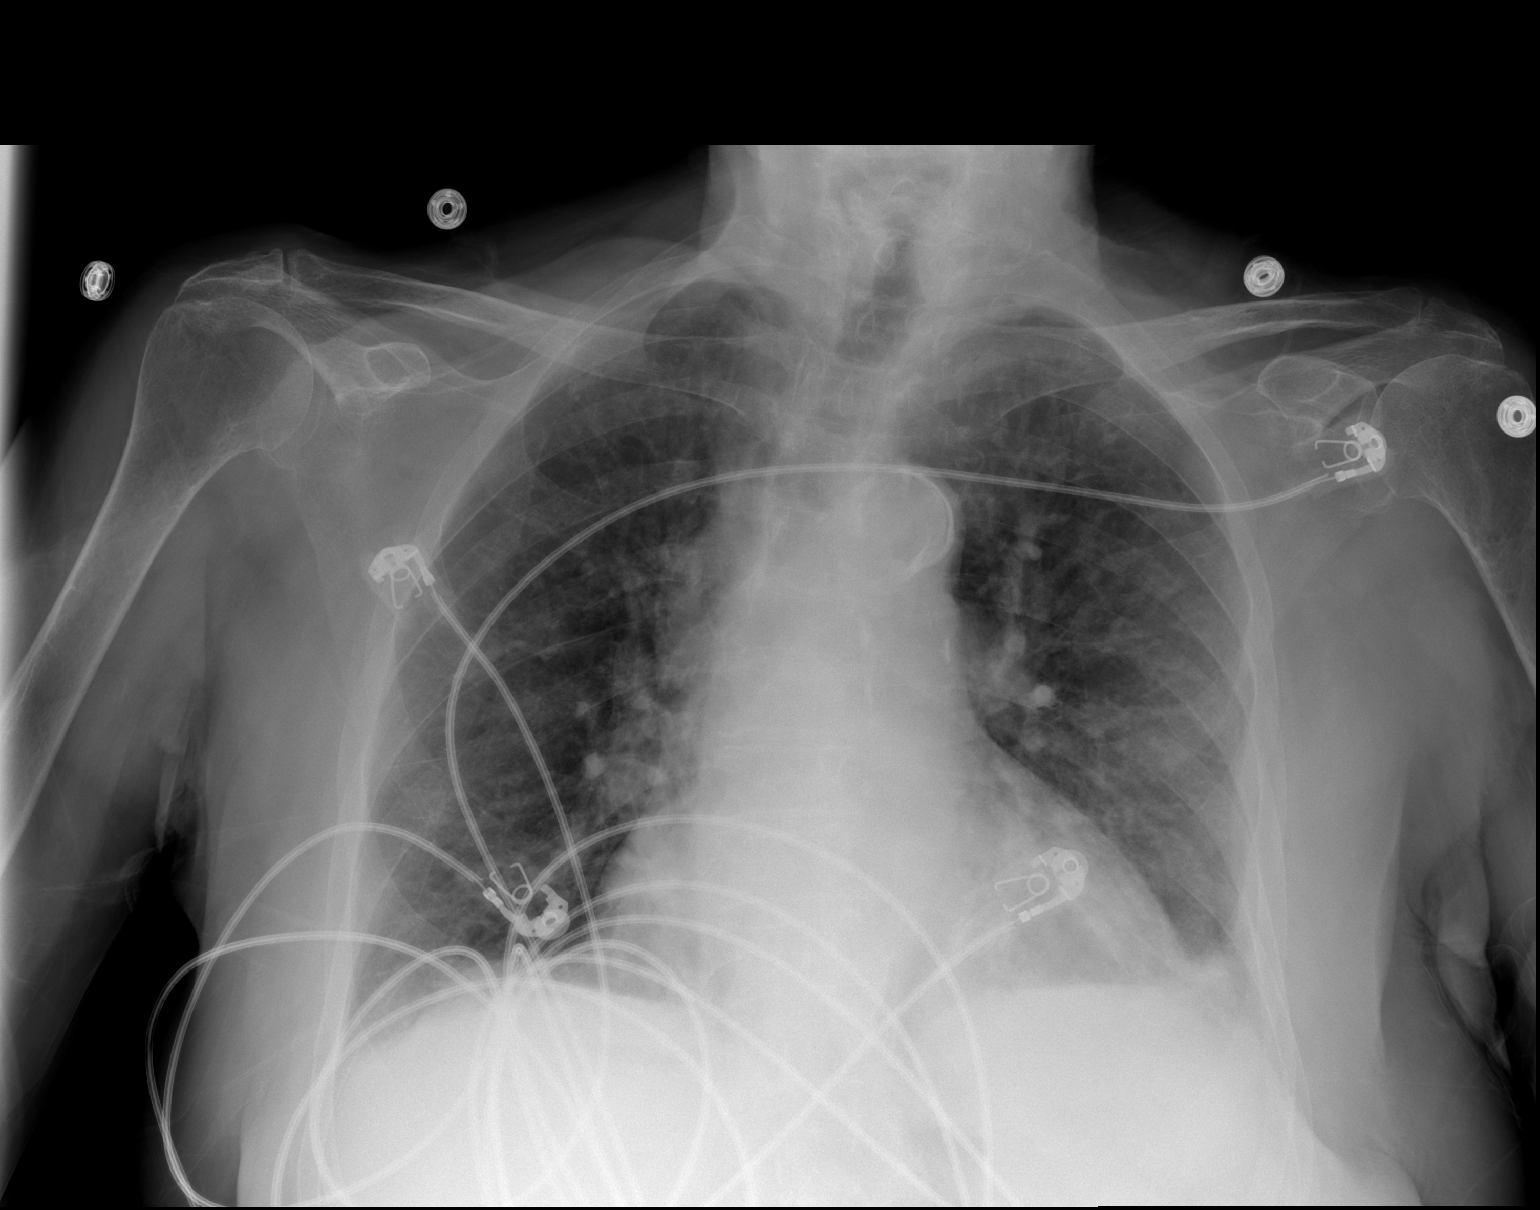

[w chest lat]
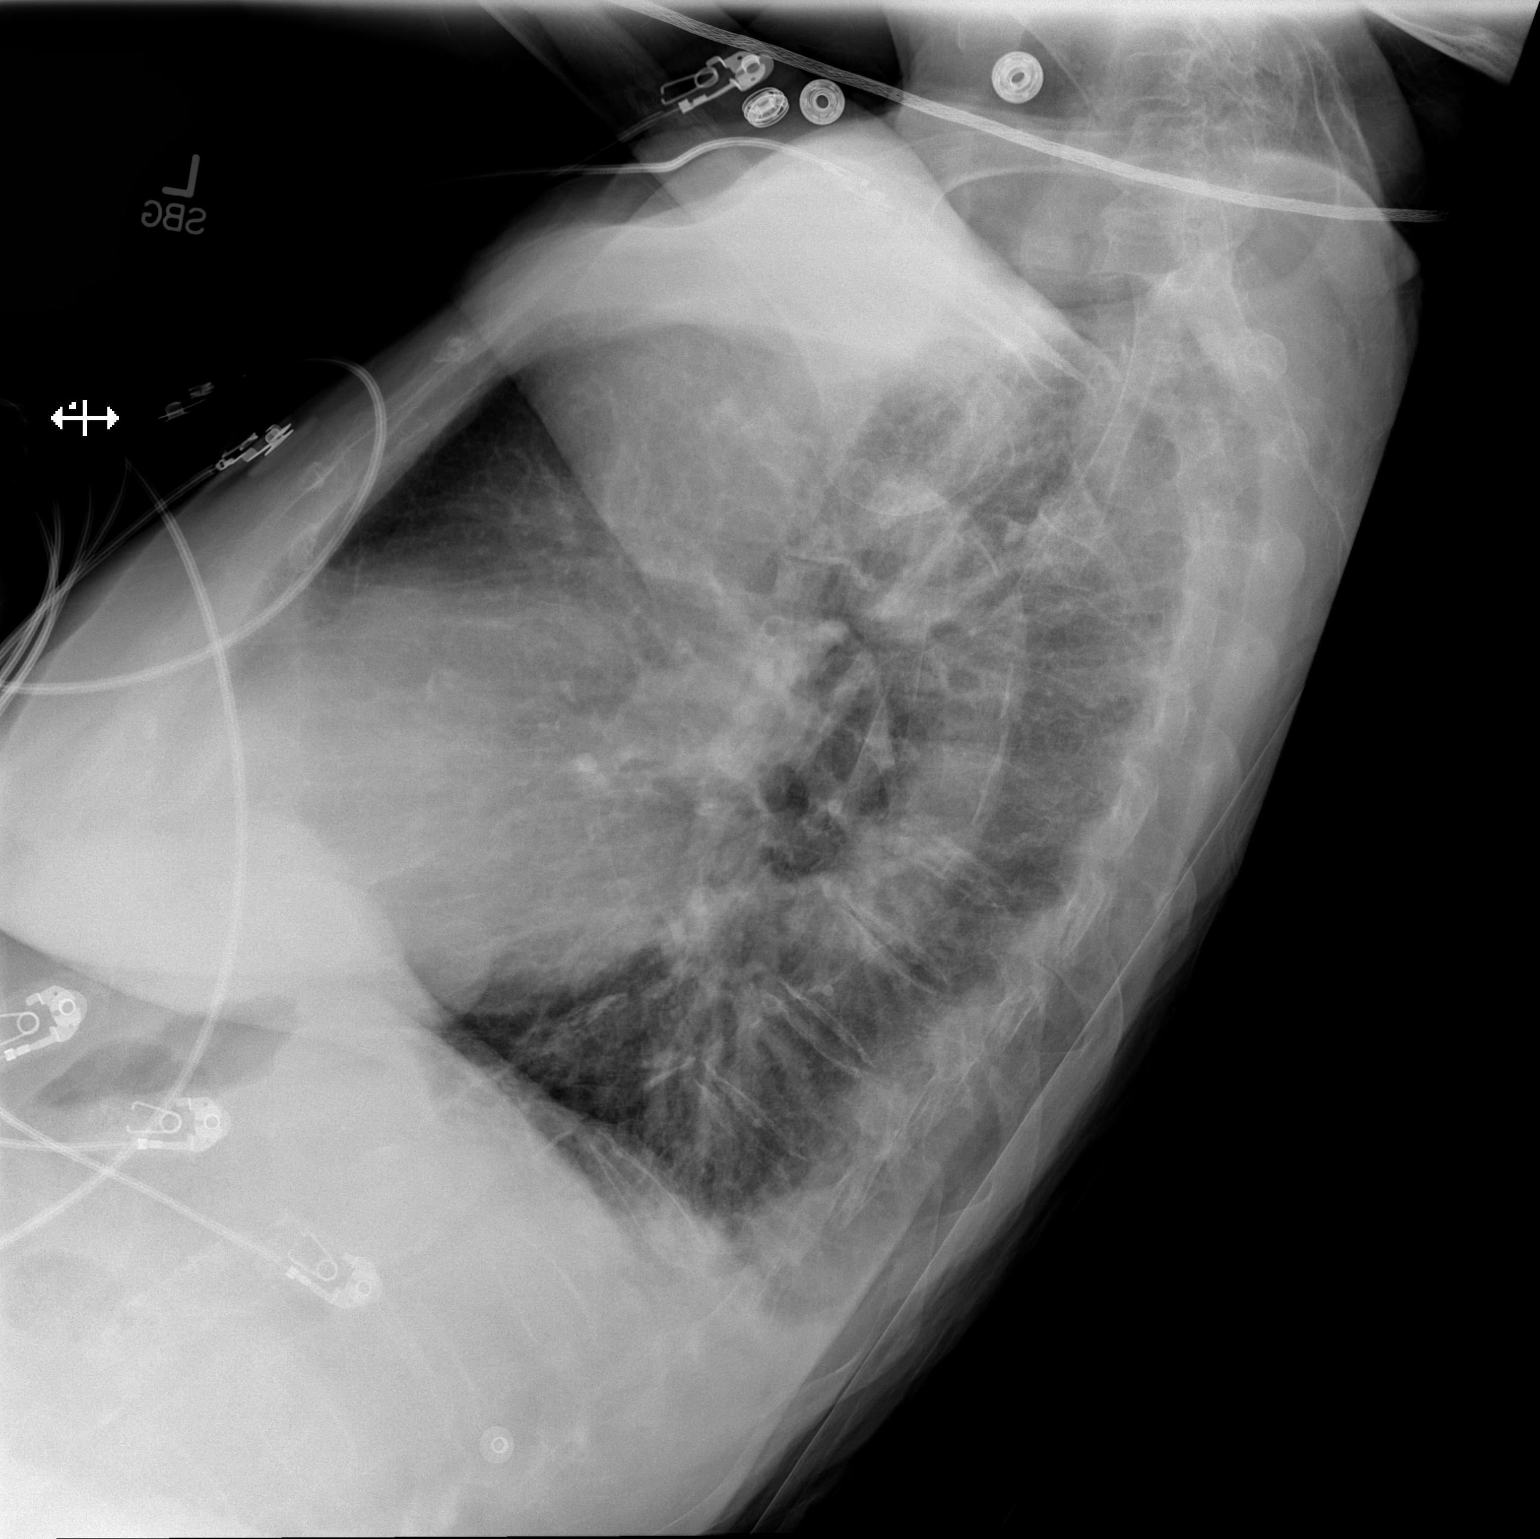

[2 of 2 positions shown; findings below may reference images not displayed]

FINDINGS: Heart is mildly enlarged but also accentuated by the technique.
Shallow lung inflation. There is pulmonary vascular congestion but
no overt edema. Focal left lower lobe atelectasis is present. Small
bilateral pleural effusions. There is dense atherosclerotic
calcification of the thoracic aorta.
IMPRESSION: 1. Cardiomegaly and pulmonary vascular congestion.
2. Left lower lobe atelectasis.  Bilateral pleural effusions.
3.  Aortic atherosclerosis.

## 2018-05-28 ENCOUNTER — Other Ambulatory Visit: Payer: Self-pay | Admitting: Cardiovascular Disease

## 2018-05-29 NOTE — Telephone Encounter (Signed)
Rx request sent to pharmacy.  

## 2018-09-27 ENCOUNTER — Emergency Department (HOSPITAL_COMMUNITY)
Admission: EM | Admit: 2018-09-27 | Discharge: 2018-09-27 | Disposition: A | Discharge status: Home / Self Care | Payer: Medicare Other | Attending: Emergency Medicine | Admitting: Emergency Medicine

## 2018-09-27 ENCOUNTER — Other Ambulatory Visit: Payer: Self-pay

## 2018-09-27 DIAGNOSIS — Z7901 Long term (current) use of anticoagulants: Secondary | ICD-10-CM | POA: Insufficient documentation

## 2018-09-27 DIAGNOSIS — I10 Essential (primary) hypertension: Secondary | ICD-10-CM | POA: Insufficient documentation

## 2018-09-27 DIAGNOSIS — Z79899 Other long term (current) drug therapy: Secondary | ICD-10-CM | POA: Insufficient documentation

## 2018-09-27 DIAGNOSIS — K1379 Other lesions of oral mucosa: Secondary | ICD-10-CM | POA: Insufficient documentation

## 2018-09-27 LAB — CBC WITH DIFFERENTIAL/PLATELET
Abs Immature Granulocytes: 0.03 10*3/uL (ref 0.00–0.07)
BASOS ABS: 0 10*3/uL (ref 0.0–0.1)
Basophils Relative: 1 %
EOS PCT: 2 %
Eosinophils Absolute: 0.1 10*3/uL (ref 0.0–0.5)
HEMATOCRIT: 39.3 % (ref 36.0–46.0)
HEMOGLOBIN: 12.7 g/dL (ref 12.0–15.0)
Immature Granulocytes: 0 %
LYMPHS ABS: 1.4 10*3/uL (ref 0.7–4.0)
LYMPHS PCT: 18 %
MCH: 29.3 pg (ref 26.0–34.0)
MCHC: 32.3 g/dL (ref 30.0–36.0)
MCV: 90.8 fL (ref 80.0–100.0)
MONO ABS: 0.7 10*3/uL (ref 0.1–1.0)
MONOS PCT: 9 %
NRBC: 0 % (ref 0.0–0.2)
Neutro Abs: 5.6 10*3/uL (ref 1.7–7.7)
Neutrophils Relative %: 70 %
Platelets: 247 10*3/uL (ref 150–400)
RBC: 4.33 MIL/uL (ref 3.87–5.11)
RDW: 14.6 % (ref 11.5–15.5)
WBC: 7.9 10*3/uL (ref 4.0–10.5)

## 2018-09-27 LAB — COMPREHENSIVE METABOLIC PANEL
ALK PHOS: 89 U/L (ref 38–126)
ALT: 15 U/L (ref 0–44)
ANION GAP: 10 (ref 5–15)
AST: 24 U/L (ref 15–41)
Albumin: 3.3 g/dL — ABNORMAL LOW (ref 3.5–5.0)
BILIRUBIN TOTAL: 0.7 mg/dL (ref 0.3–1.2)
BUN: 7 mg/dL — ABNORMAL LOW (ref 8–23)
CALCIUM: 9.4 mg/dL (ref 8.9–10.3)
CO2: 26 mmol/L (ref 22–32)
Chloride: 101 mmol/L (ref 98–111)
Creatinine, Ser: 0.64 mg/dL (ref 0.44–1.00)
GLUCOSE: 106 mg/dL — AB (ref 70–99)
POTASSIUM: 3.2 mmol/L — AB (ref 3.5–5.1)
Sodium: 137 mmol/L (ref 135–145)
TOTAL PROTEIN: 7.1 g/dL (ref 6.5–8.1)

## 2018-09-27 LAB — PROTIME-INR
INR: 2.02
Prothrombin Time: 22.6 seconds — ABNORMAL HIGH (ref 11.4–15.2)

## 2018-09-27 LAB — APTT: APTT: 43 s — AB (ref 24–36)

## 2018-09-27 NOTE — ED Notes (Signed)
Pt verbalized understanding of discharge paperwork and follow-up care.  °

## 2018-09-27 NOTE — Discharge Instructions (Signed)
Apply pressure to the bleeding site. Rinse mouth with cold water. Follow up as needed. Return if unable to stop bleeding or if have abdominal pain, vomiting blood, or any new concerning symptom.

## 2018-09-27 NOTE — ED Provider Notes (Signed)
[facility] EMERGENCY DEPARTMENT Provider Note   CSN: [id number] Arrival date & time: 09/27/18  0900     History   Chief Complaint Chief Complaint  Patient presents with  . Mouth Injury    HPI Nancy Hayes is a 85 y.o. female.  HPI Nancy Hayes is a 85 y.o. female presents to emergency department with complaint of blood in her mouth.  Patient states she woke up this morning with dried up blood in her mouth.  Patient states she was able to wipe some of the blood off, however when she was rinsing her mouth she noticed some more brown blood coming out of her mouth.  Patient denies any pain in her mouth.  Denies any sore throat.  No nausea or vomiting.  No abdominal pain.  Denies any blood in her stool.  Denies history of the same.  Patient is on Xarelto for A. fib.  Denies any blood anywhere else.  Patient wears dentures and states she sleeps in her dentures.  She does not have any teeth.  She has no dizziness, lightheadedness.  No other complaints.  Past Medical History:  Diagnosis Date  . AC (acromioclavicular) joint bone spurs    neck  . Arthritis   . Atrial fibrillation ([place])   . Dizzy spells   . Dysrhythmia   . Gout   . History of kidney stones   . Hypertension   . Macular degeneration of both eyes    right eye  . Renal disorder    pt denies  . UTI (lower urinary tract infection)   . Vertigo   . Vision abnormalities     Patient Active Problem List   Diagnosis Date Noted  . Paroxysmal atrial fibrillation (HCC)   . Tachycardia-bradycardia syndrome ([place])   . Hypokalemia 04/08/2017  . Sepsis ([place]) 04/06/2017  . Essential hypertension 11/23/2015  . PACs (premature atrial contraction)  with trigeminy 06/29/2015    Past Surgical History:  Procedure Laterality Date  . BALLOON DILATION N/A 12/05/2017   Procedure: BALLOON DILATION;  Surgeon: Carol Ada, MD;  Location: [facility];  Service: Endoscopy;  Laterality: N/A;  . CATARACT EXTRACTION      . ESOPHAGOGASTRODUODENOSCOPY (EGD) WITH PROPOFOL N/A 12/05/2017   Procedure: ESOPHAGOGASTRODUODENOSCOPY (EGD) WITH PROPOFOL;  Surgeon: Carol Ada, MD;  Location: [facility];  Service: Endoscopy;  Laterality: N/A;  . EXTRACORPOREAL SHOCK WAVE LITHOTRIPSY Right 05/26/2017   Procedure: EXTRACORPOREAL SHOCK WAVE LITHOTRIPSY (ESWL);  Surgeon: Nickie Retort, MD;  Location: [facility];  Service: Urology;  Laterality: Right;  . PARTIAL HYSTERECTOMY     pt states total hysterectomy     OB History   No obstetric history on file.      Home Medications    Prior to Admission medications   Medication Sig Start Date End Date Taking? Authorizing Provider  amLODipine (NORVASC) 10 MG tablet Take 1 tablet (10 mg total) by mouth daily. 12/15/17   Croitoru, Mihai, MD  Calcium Carbonate-Vitamin D (CALCIUM-D PO) Take 1 tablet by mouth daily.     [provider]  Cranberry 500 MG CAPS Take 1,000 mg by mouth daily.     [provider]  docusate sodium (COLACE) 250 MG capsule Take 250 mg by mouth daily.    [provider]  hydrochlorothiazide (MICROZIDE) 12.5 MG capsule Take 1 capsule (12.5 mg total) by mouth 4 (four) times a week. Tuesdays, Thursdays, Saturdays, and Sundays 12/16/17 02/09/19  Croitoru, Dani Gobble, MD  LACTOBACILLUS BIFIDUS PO  Take 1 capsule by mouth daily.    [provider]  metoprolol succinate (TOPROL-XL) 50 MG 24 hr tablet Take 1 tablet (50 mg total) by mouth daily. 12/15/17   Croitoru, Mihai, MD  Multiple Vitamins-Minerals (PRESERVISION AREDS 2) CAPS Take 2 capsules by mouth daily.    [provider]  Omega-3 Fatty Acids (FISH OIL) 1000 MG CAPS Take 1,000 mg by mouth daily.    [provider]  omeprazole (PRILOSEC) 40 MG capsule Take 40 mg by mouth daily. 12/06/17   [provider]  polyvinyl alcohol (ARTIFICIAL TEARS) 1.4 % ophthalmic solution Place 1 drop into both eyes 2 (two) times daily.    [provider]  rivaroxaban  (XARELTO) 20 MG TABS tablet Take 1 tablet (20 mg total) by mouth daily with supper. 05/29/18   Croitoru, Mihai, MD  sulfamethoxazole-trimethoprim (BACTRIM,SEPTRA) 400-80 MG tablet Take 1 tablet by mouth 2 (two) times daily.    [provider]    Family History Family History  Problem Relation Age of Onset  . Hypertension Mother   . Stroke Sister     Social History Social History   Tobacco Use  . Smoking status: Never Smoker  . Smokeless tobacco: Never Used  Substance Use Topics  . Alcohol use: No  . Drug use: No     Allergies   Patient has no known allergies.   Review of Systems Review of Systems  Constitutional: Negative for chills and fever.  HENT: Negative for mouth sores, nosebleeds, sore throat, trouble swallowing and voice change.   Respiratory: Negative for cough, chest tightness and shortness of breath.   Cardiovascular: Negative for chest pain, palpitations and leg swelling.  Gastrointestinal: Negative for abdominal pain, diarrhea, nausea and vomiting.  Genitourinary: Negative for dysuria, flank pain and pelvic pain.  Musculoskeletal: Negative for arthralgias, myalgias, neck pain and neck stiffness.  Skin: Negative for rash.  Neurological: Negative for dizziness, weakness and headaches.  All other systems reviewed and are negative.    Physical Exam Updated Vital Signs There were no vitals taken for this visit.  Physical Exam Vitals signs and nursing note reviewed.  Constitutional:      General: She is not in acute distress.    Appearance: She is well-developed.  HENT:     Head: Normocephalic.     Nose: Nose normal.     Mouth/Throat:     Comments: A dentulous, several areas of erythema to the posterior oropharynx.  Gums appear to be overall normal.  Uvula is normal midline. No active bleeding Eyes:     Conjunctiva/sclera: Conjunctivae normal.  Neck:     Musculoskeletal: Neck supple.  Cardiovascular:     Rate and Rhythm: Normal rate and  regular rhythm.     Heart sounds: Normal heart sounds.  Pulmonary:     Effort: Pulmonary effort is normal. No respiratory distress.     Breath sounds: Normal breath sounds. No wheezing or rales.  Abdominal:     General: Bowel sounds are normal. There is no distension.     Palpations: Abdomen is soft.     Tenderness: There is no abdominal tenderness. There is no rebound.  Skin:    General: Skin is warm and dry.  Neurological:     Mental Status: She is alert.  Psychiatric:        Behavior: Behavior normal.      ED Treatments / Results  Labs (all labs ordered are listed, but only abnormal results are displayed) Labs Reviewed  CBC WITH DIFFERENTIAL/PLATELET  COMPREHENSIVE METABOLIC PANEL  PROTIME-INR  APTT    EKG None  Radiology No results found.  Procedures Procedures (including critical care time)  Medications Ordered in ED Medications - No data to display   Initial Impression / Assessment and Plan / ED Course  I have reviewed the triage vital signs and the nursing notes.  Pertinent labs & imaging results that were available during my care of the patient were reviewed by me and considered in my medical decision making (see chart for details).     Pt with dried up blood in her mouth onset this morning. No rectal bleed at this time. No sore throat, no abdominal pain. Pt is on xarelto. No injuries. Will check labs including CBC, PT/PTT. Will monitor.   10:39 AM Normal CBC. PT/PTT. Pt has not had any more bleeding.  Discussed with Dr. Billy Fischer, will discharge home.  Strict return precautions discussed.  Vitals:   09/27/18 0910 09/27/18 0912 09/27/18 1000 09/27/18 1047  BP: (!) 178/87  (!) 173/53 (!) 175/69  Pulse: (!) 52  (!) 55 66  Resp: 18  18 18   Temp: 98.4 F (36.9 C)     TempSrc: Oral     SpO2: 98%  97% 97%  Weight:  61.2 kg    Height:  5\' 4"  (1.626 m)       Final Clinical Impressions(s) / ED Diagnoses   Final diagnoses:  Mouth bleeding    ED  Discharge Orders    None       Jeannett Senior, PA-C 09/27/18 1657    Gareth Morgan, MD 09/29/18 2129

## 2018-09-27 NOTE — ED Triage Notes (Signed)
Pt states she noticed this morning when she woke up she had dark blood in her mouth ; pt on xarelto ; pt denies any trauma to the area ; pt denies any n/v ; pt alert and oriented x 4 ; denies any pain ; no oral trauma noted ;

## 2018-11-04 ENCOUNTER — Other Ambulatory Visit: Payer: Self-pay | Admitting: Cardiovascular Disease

## 2018-11-04 NOTE — Telephone Encounter (Signed)
Pt is a 81yof w/ a Scr of 0.64 (09/27/18), wt of 60.3kg, and last seen in the office by Dr. Sallyanne Kuster on 12/15/17 which only clears the pt for a dose of 15mg  xarelto when they are requesting 20mg  Ccr=26 and for 20mg  must be over 50

## 2018-12-07 ENCOUNTER — Other Ambulatory Visit: Payer: Self-pay

## 2018-12-07 ENCOUNTER — Telehealth (INDEPENDENT_AMBULATORY_CARE_PROVIDER_SITE_OTHER): Payer: Medicare Other | Admitting: Cardiovascular Disease

## 2018-12-07 ENCOUNTER — Telehealth: Payer: Self-pay | Admitting: Cardiovascular Disease

## 2018-12-07 DIAGNOSIS — I480 Paroxysmal atrial fibrillation: Secondary | ICD-10-CM | POA: Diagnosis not present

## 2018-12-07 DIAGNOSIS — Z7901 Long term (current) use of anticoagulants: Secondary | ICD-10-CM | POA: Diagnosis not present

## 2018-12-07 DIAGNOSIS — I10 Essential (primary) hypertension: Secondary | ICD-10-CM

## 2018-12-07 MED ORDER — METOPROLOL SUCCINATE ER 50 MG PO TB24: 50.0000 mg | ORAL_TABLET | Freq: Every day | ORAL | 3 refills | Status: DC

## 2018-12-07 MED ORDER — AMLODIPINE BESYLATE 10 MG PO TABS: 10.0000 mg | ORAL_TABLET | Freq: Every day | ORAL | 3 refills | Status: DC

## 2018-12-07 MED ORDER — HYDROCHLOROTHIAZIDE 12.5 MG PO CAPS: 12.5000 mg | ORAL_CAPSULE | ORAL | 3 refills | Status: DC

## 2018-12-07 MED ORDER — RIVAROXABAN 20 MG PO TABS: 20.0000 mg | ORAL_TABLET | Freq: Every day | ORAL | 1 refills | Status: DC

## 2018-12-07 NOTE — Telephone Encounter (Signed)
Virtual Visit Pre-Appointment Phone Call  Steps For Call:  1. Confirm consent - "In the setting of the current Covid19 crisis, you are scheduled for a (phone or video) visit with your provider on (date) at (time).  Just as we do with many in-office visits, in order for you to participate in this visit, we must obtain consent.  If you'd like, I can send this to your mychart (if signed up) or email for you to review.  Otherwise, I can obtain your verbal consent now.  All virtual visits are billed to your insurance company just like a normal visit would be.  By agreeing to a virtual visit, we'd like you to understand that the technology does not allow for your provider to perform an examination, and thus may limit your provider's ability to fully assess your condition.  Finally, though the technology is pretty good, we cannot assure that it will always work on either your or our end, and in the setting of a video visit, we may have to convert it to a phone-only visit.  In either situation, we cannot ensure that we have a secure connection.  Are you willing to proceed?"  2. Give patient instructions for WebEx download to smartphone as below if video visit  3. Advise patient to be prepared with any vital sign or heart rhythm information, their current medicines, and a piece of paper and pen handy for any instructions they may receive the day of their visit  4. Inform patient they will receive a phone call 15 minutes prior to their appointment time (may be from unknown caller ID) so they should be prepared to answer  5. Confirm that appointment type is correct in Epic appointment notes (video vs telephone)    TELEPHONE CALL NOTE  Dorianna G Behar has been deemed a candidate for a follow-up tele-health visit to limit community exposure during the Covid-19 pandemic. I spoke with the patient via phone to ensure availability of phone/video source, confirm preferred email & phone number, and discuss  instructions and expectations.  I reminded Ambyr G Jakes to be prepared with any vital sign and/or heart rhythm information that could potentially be obtained via home monitoring, at the time of her visit. I reminded Yina G Keena to expect a phone call at the time of her visit if her visit.  Did the patient verbally acknowledge consent to treatment? Yes  Therisa Doyne 12/07/2018 8:57 AM  CONSENT FOR TELE-HEALTH VISIT - PLEASE REVIEW  I hereby voluntarily request, consent and authorize [facility] and its employed or contracted physicians, physician assistants, nurse practitioners or other licensed health care professionals (the Practitioner), to provide me with telemedicine health care services (the Services") as deemed necessary by the treating Practitioner. I acknowledge and consent to receive the Services by the Practitioner via telemedicine. I understand that the telemedicine visit will involve communicating with the Practitioner through live audiovisual communication technology and the disclosure of certain medical information by electronic transmission. I acknowledge that I have been given the opportunity to request an in-person assessment or other available alternative prior to the telemedicine visit and am voluntarily participating in the telemedicine visit.  I understand that I have the right to withhold or withdraw my consent to the use of telemedicine in the course of my care at any time, without affecting my right to future care or treatment, and that the Practitioner or I may terminate the telemedicine visit at any time. I understand that I  have the right to inspect all information obtained and/or recorded in the course of the telemedicine visit and may receive copies of available information for a reasonable fee.  I understand that some of the potential risks of receiving the Services via telemedicine include:   Delay or interruption in medical evaluation due to technological  equipment failure or disruption;  Information transmitted may not be sufficient (e.g. poor resolution of images) to allow for appropriate medical decision making by the Practitioner; and/or   In rare instances, security protocols could fail, causing a breach of personal health information.  Furthermore, I acknowledge that it is my responsibility to provide information about my medical history, conditions and care that is complete and accurate to the best of my ability. I acknowledge that Practitioner's advice, recommendations, and/or decision may be based on factors not within their control, such as incomplete or inaccurate data provided by me or distortions of diagnostic images or specimens that may result from electronic transmissions. I understand that the practice of medicine is not an exact science and that Practitioner makes no warranties or guarantees regarding treatment outcomes. I acknowledge that I will receive a copy of this consent concurrently upon execution via email to the email address I last provided but may also request a printed copy by calling the office of [facility].    I understand that my insurance will be billed for this visit.   I have read or had this consent read to me.  I understand the contents of this consent, which adequately explains the benefits and risks of the Services being provided via telemedicine.   I have been provided ample opportunity to ask questions regarding this consent and the Services and have had my questions answered to my satisfaction.  I give my informed consent for the services to be provided through the use of telemedicine in my medical care  By participating in this telemedicine visit I agree to the above.

## 2018-12-07 NOTE — Patient Instructions (Signed)
Medication Instructions:  Your physician recommends that you continue on your current medications as directed. Please refer to the Current Medication list given to you today.  If you need a refill on your cardiac medications before your next appointment, please call your pharmacy.   Follow-Up: At [facility], you and your health needs are our priority.  As part of our continuing mission to provide you with exceptional heart care, we have created designated Provider Care Teams.  These Care Teams include your primary Cardiologist (physician) and Advanced Practice Providers (APPs -  Physician Assistants and Nurse Practitioners) who all work together to provide you with the care you need, when you need it. You will need a follow up appointment in 1 year.  Please call our office 2 months in advance to schedule this appointment.  You may see Sanda Klein, MD or one of the following Advanced Practice Providers on your designated Care Team: [place], [place] . Fabian Sharp, PA-C  Any Other Special Instructions Will Be Listed Below (If Applicable). None

## 2018-12-07 NOTE — Progress Notes (Signed)
Virtual Visit via Telephone Note   This visit type was conducted due to national recommendations for restrictions regarding the COVID-19 Pandemic (e.g. social distancing) in an effort to limit this patient's exposure and mitigate transmission in our community.  Due to her co-morbid illnesses, this patient is at least at moderate risk for complications without adequate follow up.  This format is felt to be most appropriate for this patient at this time.  The patient did not have access to video technology/had technical difficulties with video requiring transitioning to audio format only (telephone).  All issues noted in this document were discussed and addressed.  No physical exam could be performed with this format.  Please refer to the patient's chart for her  consent to telehealth for [facility].   Evaluation Performed:  Follow-up visit  Date:  12/07/2018   ID:  Nancy Hayes, DOB [id number], MRN [id number]  Patient Location: Home  Provider Location: Home  PCP:  Aletha Halim., PA-C  Cardiologist:  Sanda Klein, MD  Electrophysiologist:  None   Chief Complaint:  AFib follow up, HTN  History of Present Illness:    Nancy Hayes is a 85 y.o. female who presents via audio/video conferencing for a telehealth visit today.    She is generally doing well.  She had one episode of bleeding in her mouth.  She woke up with partly clotted blood and went to the emergency room.  The work-up was unremarkable and the problem has not recurred.  She has not had any falls or injuries or bleeding problems otherwise.  She is fairly inactive.  She lives with her son Nancy Hayes.) ever since her husband Nancy Hayes passed away.  The patient specifically denies any chest pain at rest exertion, dyspnea at rest or with exertion, orthopnea, paroxysmal nocturnal dyspnea, syncope, palpitations, focal neurological deficits, intermittent claudication, lower extremity edema, unexplained weight gain,  cough, hemoptysis or wheezing.  The patient does not have symptoms concerning for COVID-19 infection (fever, chills, cough, or new shortness of breath).    Past Medical History:  Diagnosis Date  . AC (acromioclavicular) joint bone spurs    neck  . Arthritis   . Atrial fibrillation ([place])   . Dizzy spells   . Dysrhythmia   . Gout   . History of kidney stones   . Hypertension   . Macular degeneration of both eyes    right eye  . Renal disorder    pt denies  . UTI (lower urinary tract infection)   . Vertigo   . Vision abnormalities    Past Surgical History:  Procedure Laterality Date  . BALLOON DILATION N/A 12/05/2017   Procedure: BALLOON DILATION;  Surgeon: Carol Ada, MD;  Location: [facility];  Service: Endoscopy;  Laterality: N/A;  . CATARACT EXTRACTION    . ESOPHAGOGASTRODUODENOSCOPY (EGD) WITH PROPOFOL N/A 12/05/2017   Procedure: ESOPHAGOGASTRODUODENOSCOPY (EGD) WITH PROPOFOL;  Surgeon: Carol Ada, MD;  Location: [facility];  Service: Endoscopy;  Laterality: N/A;  . EXTRACORPOREAL SHOCK WAVE LITHOTRIPSY Right 05/26/2017   Procedure: EXTRACORPOREAL SHOCK WAVE LITHOTRIPSY (ESWL);  Surgeon: Nickie Retort, MD;  Location: [facility];  Service: Urology;  Laterality: Right;  . PARTIAL HYSTERECTOMY     pt states total hysterectomy     No outpatient medications have been marked as taking for the 12/07/18 encounter (Telemedicine) with Michella Detjen, Dani Gobble, MD.     Allergies:   Patient has no known allergies.   Social History   Tobacco Use  .  Smoking status: Never Smoker  . Smokeless tobacco: Never Used  Substance Use Topics  . Alcohol use: No  . Drug use: No     Family Hx: The patient's family history includes Hypertension in her mother; Stroke in her sister.  ROS:   Please see the history of present illness.     All other systems reviewed and are negative.   Prior CV studies:   The following studies were reviewed today:  ED visit 09/27/2018  Labs/Other Tests  and Data Reviewed:    EKG:  An ECG dated December 16, 2017 was personally reviewed today and demonstrated:  NSR, lateral ST depression  Recent Labs: 09/27/2018: ALT 15; BUN 7; Creatinine, Ser 0.64; Hemoglobin 12.7; Platelets 247; Potassium 3.2; Sodium 137   Recent Lipid Panel No results found for: CHOL, TRIG, HDL, CHOLHDL, LDLCALC, LDLDIRECT  Wt Readings from Last 3 Encounters:  12/07/18 145 lb (65.8 kg)  09/27/18 135 lb (61.2 kg)  12/15/17 133 lb (60.3 kg)     Objective:    Vital Signs:  BP (!) 172/65   Pulse 65   Ht 5\' 4"  (1.626 m)   Wt 145 lb (65.8 kg)   BMI 24.89 kg/m    Unable to assess physical exam.  ASSESSMENT & PLAN:    1. HTN: SBP a little high, but I am reluctant to adjust meds since she has had symptoms of hypotension in the past. Will recheck once daily for a week and let me know. 2. AFib: asymptomatic. Unaware of any palpitations. No neurological events. 3. Xarelto: one episode of oral bleeding in January, no recurrence.  COVID-19 Education: The signs and symptoms of COVID-19 were discussed with the patient and how to seek care for testing (follow up with PCP or arrange E-visit).  The importance of social distancing was discussed today.  Time:   Today, I have spent 15 minutes with the patient with telehealth technology discussing the above problems.     Medication Adjustments/Labs and Tests Ordered: Current medicines are reviewed at length with the patient today.  Concerns regarding medicines are outlined above.  Tests Ordered: No orders of the defined types were placed in this encounter.  Medication Changes: No orders of the defined types were placed in this encounter.   Disposition:  Follow up one year  Signed, Sanda Klein, MD  12/07/2018 10:46 AM    [place]

## 2018-12-15 ENCOUNTER — Telehealth: Payer: Self-pay | Admitting: Cardiovascular Disease

## 2018-12-15 NOTE — Telephone Encounter (Signed)
B/P readings sent to Dr.Croitoru.

## 2018-12-15 NOTE — Telephone Encounter (Signed)
New Message   Pt c/o BP issue: STAT if pt c/o blurred vision, one-sided weakness or slurred speech  1. What are your last 5 BP readings? Last Tuesday- 135/69  Wednesday- 155/73 Thursday- 144/67 Friday- 138/70 Saturday- 128/67 Sunday- 178/71  Monday- 155/80 Tuesday- 131/71  2. Are you having any other symptoms (ex. Dizziness, headache, blurred vision, passed out)? No   3. What is your BP issue? Pts son is calling to get the last week of BP's of the Pt

## 2018-12-15 NOTE — Telephone Encounter (Signed)
Occasionally high, but overall acceptable and no change to medications is necessary. Thanks [employer]

## 2018-12-15 NOTE — Telephone Encounter (Signed)
Spoke to patient Dr.Croitoru's advice given. 

## 2018-12-21 ENCOUNTER — Ambulatory Visit: Payer: Medicare Other | Admitting: Cardiovascular Disease

## 2019-04-16 ENCOUNTER — Other Ambulatory Visit: Payer: Self-pay

## 2019-04-16 ENCOUNTER — Ambulatory Visit
Admission: RE | Admit: 2019-04-16 | Discharge: 2019-04-16 | Disposition: A | Discharge status: Home / Self Care | Payer: Medicare Other | Source: Ambulatory Visit | Attending: Family Medicine | Admitting: Family Medicine

## 2019-04-16 ENCOUNTER — Other Ambulatory Visit: Payer: Self-pay | Admitting: Family Medicine

## 2019-04-16 DIAGNOSIS — M545 Low back pain, unspecified: Secondary | ICD-10-CM

## 2019-04-22 ENCOUNTER — Ambulatory Visit: Payer: Medicare Other | Attending: Family Medicine | Admitting: Physical Therapy

## 2019-04-22 ENCOUNTER — Encounter: Payer: Self-pay | Admitting: Physical Therapy

## 2019-04-22 ENCOUNTER — Other Ambulatory Visit: Payer: Self-pay

## 2019-04-22 DIAGNOSIS — R293 Abnormal posture: Secondary | ICD-10-CM | POA: Insufficient documentation

## 2019-04-22 DIAGNOSIS — M5442 Lumbago with sciatica, left side: Secondary | ICD-10-CM | POA: Insufficient documentation

## 2019-04-22 NOTE — Therapy (Signed)
[facility] [place], [place], [address] Phone: [phone]   Fax:  [phone]  Physical Therapy Evaluation  Patient Details  Name: Nancy Hayes MRN: [id number] Date of Birth: [birth year] Referring Provider (PT): Bing Matter PA-C.   Encounter Date: 04/22/2019  PT End of Session - 04/22/19 1416    Visit Number  1    Number of Visits  12    Date for PT Re-Evaluation  07/19/19    Authorization Type  PROGRESS NOTE AT 10TH VISIT.  KX MODIFIER AFTER 15 VISITS.    PT Start Time  0145    PT Stop Time  0228    PT Time Calculation (min)  43 min    Activity Tolerance  Patient tolerated treatment well    Behavior During Therapy  [facility] for tasks assessed/performed       Past Medical History:  Diagnosis Date  . AC (acromioclavicular) joint bone spurs    neck  . Arthritis   . Atrial fibrillation ([place])   . Dizzy spells   . Dysrhythmia   . Gout   . History of kidney stones   . Hypertension   . Macular degeneration of both eyes    right eye  . Renal disorder    pt denies  . UTI (lower urinary tract infection)   . Vertigo   . Vision abnormalities     Past Surgical History:  Procedure Laterality Date  . BALLOON DILATION N/A 12/05/2017   Procedure: BALLOON DILATION;  Surgeon: Carol Ada, MD;  Location: [facility];  Service: Endoscopy;  Laterality: N/A;  . CATARACT EXTRACTION    . ESOPHAGOGASTRODUODENOSCOPY (EGD) WITH PROPOFOL N/A 12/05/2017   Procedure: ESOPHAGOGASTRODUODENOSCOPY (EGD) WITH PROPOFOL;  Surgeon: Carol Ada, MD;  Location: [facility];  Service: Endoscopy;  Laterality: N/A;  . EXTRACORPOREAL SHOCK WAVE LITHOTRIPSY Right 05/26/2017   Procedure: EXTRACORPOREAL SHOCK WAVE LITHOTRIPSY (ESWL);  Surgeon: Nickie Retort, MD;  Location: [facility];  Service: Urology;  Laterality: Right;  . PARTIAL HYSTERECTOMY     pt states total hysterectomy    There were no vitals filed for this visit.   Subjective  Assessment - 04/22/19 1401    Subjective  COVID-19 screen performed prior to patient entering clinic.  The patient reports the onset on left sided low back pain about 3 weeks ago for no apparent reason.  She reports the pain will also shoot into her left buttock and her left leg will hurt.  Her pain-level is a 6/10 today but can rise to higher levels with increased moving.  She has not found anything really decreases her pain.    Pertinent History  Patient will benefit from skilled physical therapy intervention to address deficits.    Diagnostic tests  X-ray.    Patient Stated Goals  Get out of pain.    Currently in Pain?  Yes    Pain Score  6     Pain Location  Back    Pain Orientation  Right    Pain Descriptors / Indicators  Aching;Shooting    Pain Type  Acute pain    Pain Onset  1 to 4 weeks ago    Pain Frequency  Constant    Aggravating Factors   See above.    Pain Relieving Factors  See above.         [facility] PT Assessment - 04/22/19 0001      Assessment   Medical Diagnosis  Left sided low back pain.  Referring Provider (PT)  Bing Matter PA-C.    Onset Date/Surgical Date  --   ~3 weeks ago.     Precautions   Precautions  --   Severe osteopenia.     Restrictions   Weight Bearing Restrictions  No      Balance Screen   Has the patient fallen in the past 6 months  No    Has the patient had a decrease in activity level because of a fear of falling?   No    Is the patient reluctant to leave their home because of a fear of falling?   No      Home Environment   Living Environment  Private residence      Prior Function   Level of Independence  Independent   Son helps her.     Posture/Postural Control   Posture/Postural Control  Postural limitations    Postural Limitations  Rounded Shoulders;Forward head;Decreased lumbar lordosis    Posture Comments  Right convexity mid-thoracic spine.      Deep Tendon Reflexes   DTR Assessment Site  Patella;Achilles    Patella DTR   2+    Achilles DTR  0      ROM / Strength   AROM / PROM / Strength  AROM;Strength      AROM   Overall AROM Comments  Functional spinal movement but not specifically tested due to severe osteopenia.      Strength   Overall Strength Comments  Normal LE strength.      Palpation   Palpation comment  Very tender to palption over left lower lumbar erector spinae musculature.      Special Tests   Other special tests  (=) leg lengths and (-) left FABER test.      Transfers   Transfers  --   Sit to stand with armrest usage and CGA.     Ambulation/Gait   Gait Comments  The patient walks with a FWW with some trunk forward trunk flexion and lateral bend.                Objective measurements completed on examination: See above findings.      [facility] Adult PT Treatment/Exercise - 04/22/19 0001      Modalities   Modalities  Electrical Stimulation;Moist Heat      Moist Heat Therapy   Number Minutes Moist Heat  15 Minutes    Moist Heat Location  Lumbar Spine      Electrical Stimulation   Electrical Stimulation Location  Left low back.    Electrical Stimulation Action  Pre-mod.    Electrical Stimulation Parameters  80-150 Hz x 15 minutes.    Electrical Stimulation Goals  Pain               PT Short Term Goals - 04/22/19 1418      PT SHORT TERM GOAL #1   Title  STG's=LTG's.        PT Long Term Goals - 04/22/19 1419      PT LONG TERM GOAL #1   Title  Independent with a HEP.    Time  6    Period  Weeks    Status  New      PT LONG TERM GOAL #2   Title  Perform ADL's with pain not > 2-3/10.    Time  6    Period  Weeks    Status  New      PT LONG TERM GOAL #  3   Title  Eliminate left LE pain.    Time  6    Period  Weeks    Status  New             Plan - 04/22/19 1410    Clinical Impression Statement  The patient presents to [facility] with c/o left sided low back pain with shooting pain into her left buttock.  She is very tender to palpation over  her left lower lumbar erector spinae musculature.  Her pain has impaired her functional mobility.  She has a right mid-thoracic convexity.  Patient will benefit from skilled physical therapy intervention to address deficits and pain.    Personal Factors and Comorbidities  Comorbidity 1    Comorbidities  Patient will benefit from skilled physical therapy intervention to address deficits.    Examination-Activity Limitations  Locomotion Level;Other;Transfers    Examination-Participation Restrictions  Other    Stability/Clinical Decision Making  Evolving/Moderate complexity    Clinical Decision Making  Low    Rehab Potential  Good    PT Frequency  2x / week    PT Duration  6 weeks    PT Treatment/Interventions  ADLs/Self Care Home Management;Cryotherapy;Electrical Stimulation;Ultrasound;Moist Heat;Therapeutic activities;Therapeutic exercise;Manual techniques;Patient/family education;Passive range of motion;Dry needling    PT Next Visit Plan  Modalites to left low back and STW/M and progress into low level core exercises with instruct in [facility] and hip bridges.    Consulted and Agree with Plan of Care  Patient       Patient will benefit from skilled therapeutic intervention in order to improve the following deficits and impairments:  Abnormal gait, Decreased activity tolerance, Pain, Postural dysfunction  Visit Diagnosis: Acute left-sided low back pain with left-sided sciatica - Plan: PT plan of care cert/re-cert  Abnormal posture - Plan: PT plan of care cert/re-cert     Problem List Patient Active Problem List   Diagnosis Date Noted  . Paroxysmal atrial fibrillation ([facility])   . Tachycardia-bradycardia syndrome ([place])   . Hypokalemia 04/08/2017  . Sepsis ([place]) 04/06/2017  . Essential hypertension 11/23/2015  . PACs (premature atrial contraction)  with trigeminy 06/29/2015    Jaye Saal, [place] MPT 04/22/2019, 2:28 PM  [facility] [place], [place], [address] Phone: [phone]   Fax:  [phone]  Name: CHERILYNN SCHOMBURG MRN: [id number] Date of Birth: Jan 01, 1927

## 2019-04-27 ENCOUNTER — Ambulatory Visit: Payer: Medicare Other | Admitting: Physical Therapy

## 2019-04-27 ENCOUNTER — Encounter: Payer: Self-pay | Admitting: Physical Therapy

## 2019-04-27 ENCOUNTER — Other Ambulatory Visit: Payer: Self-pay

## 2019-04-27 DIAGNOSIS — R293 Abnormal posture: Secondary | ICD-10-CM

## 2019-04-27 DIAGNOSIS — M5442 Lumbago with sciatica, left side: Secondary | ICD-10-CM | POA: Diagnosis not present

## 2019-04-27 NOTE — Therapy (Signed)
[facility] [place], [place], [address] Phone: [phone]   Fax:  [phone]  Physical Therapy Treatment  Patient Details  Name: Nancy Hayes MRN: [id number] Date of Birth: 1927/02/09 Referring Provider (PT): Bing Matter PA-C.   Encounter Date: 04/27/2019  PT End of Session - 04/27/19 1352    Visit Number  2    Number of Visits  12    Date for PT Re-Evaluation  07/19/19    Authorization Type  PROGRESS NOTE AT 10TH VISIT.  KX MODIFIER AFTER 15 VISITS.    PT Start Time  1352    PT Stop Time  1433    PT Time Calculation (min)  41 min    Activity Tolerance  Patient tolerated treatment well    Behavior During Therapy  [facility] for tasks assessed/performed       Past Medical History:  Diagnosis Date  . AC (acromioclavicular) joint bone spurs    neck  . Arthritis   . Atrial fibrillation ([place])   . Dizzy spells   . Dysrhythmia   . Gout   . History of kidney stones   . Hypertension   . Macular degeneration of both eyes    right eye  . Renal disorder    pt denies  . UTI (lower urinary tract infection)   . Vertigo   . Vision abnormalities     Past Surgical History:  Procedure Laterality Date  . BALLOON DILATION N/A 12/05/2017   Procedure: BALLOON DILATION;  Surgeon: Carol Ada, MD;  Location: [facility];  Service: Endoscopy;  Laterality: N/A;  . CATARACT EXTRACTION    . ESOPHAGOGASTRODUODENOSCOPY (EGD) WITH PROPOFOL N/A 12/05/2017   Procedure: ESOPHAGOGASTRODUODENOSCOPY (EGD) WITH PROPOFOL;  Surgeon: Carol Ada, MD;  Location: [facility];  Service: Endoscopy;  Laterality: N/A;  . EXTRACORPOREAL SHOCK WAVE LITHOTRIPSY Right 05/26/2017   Procedure: EXTRACORPOREAL SHOCK WAVE LITHOTRIPSY (ESWL);  Surgeon: Nickie Retort, MD;  Location: [facility];  Service: Urology;  Laterality: Right;  . PARTIAL HYSTERECTOMY     pt states total hysterectomy    There were no vitals filed for this visit.  Subjective  Assessment - 04/27/19 1351    Subjective  COVID 19 screening performed on patient upon arrival. Patient reports that her pain is 8/10 but not constantly. Patient is still radiating to LLE.    Pertinent History  Patient will benefit from skilled physical therapy intervention to address deficits.    Diagnostic tests  X-ray.    Patient Stated Goals  Get out of pain.    Currently in Pain?  Yes    Pain Score  8     Pain Location  Back    Pain Orientation  Left    Pain Descriptors / Indicators  Other (Comment)   catching/grabbing   Pain Type  Acute pain    Pain Onset  1 to 4 weeks ago    Pain Frequency  Intermittent         [facility] PT Assessment - 04/27/19 0001      Assessment   Medical Diagnosis  Left sided low back pain.    Referring Provider (PT)  Bing Matter PA-C.      Restrictions   Weight Bearing Restrictions  No                   [facility] Adult PT Treatment/Exercise - 04/27/19 0001      Modalities   Modalities  Electrical Stimulation;Moist Heat;Ultrasound  Moist Heat Therapy   Number Minutes Moist Heat  15 Minutes    Moist Heat Location  Lumbar Spine      Electrical Stimulation   Electrical Stimulation Location  L low back    Electrical Stimulation Action  Pre-Mod    Electrical Stimulation Parameters  80-150 hz x15 min    Electrical Stimulation Goals  Pain      Ultrasound   Ultrasound Location  L low back    Ultrasound Parameters  1.2 w/cm2, 100%, 1 mhz x10 min    Ultrasound Goals  Pain      Manual Therapy   Manual Therapy  Soft tissue mobilization    Soft tissue mobilization  STW to L lumbar paraspinals, QL to reduce pain                PT Short Term Goals - 04/22/19 1418      PT SHORT TERM GOAL #1   Title  STG's=LTG's.        PT Long Term Goals - 04/22/19 1419      PT LONG TERM GOAL #1   Title  Independent with a HEP.    Time  6    Period  Weeks    Status  New      PT LONG TERM GOAL #2   Title  Perform ADL's with pain not  > 2-3/10.    Time  6    Period  Weeks    Status  New      PT LONG TERM GOAL #3   Title  Eliminate left LE pain.    Time  6    Period  Weeks    Status  New            Plan - 04/27/19 1458    Clinical Impression Statement  Patient presented in clinic with reports of continued L LBP with continued radicular symptoms down LLE. Treatment completed in sitting for patient comfort. Patient reported soreness with manual therapy along superior iliac crest along QL. Minimally increased muscle tightness of the L lumbar paraspinals. Normal modalities response noted following removal of the modalities.    Personal Factors and Comorbidities  Comorbidity 1    Comorbidities  Patient will benefit from skilled physical therapy intervention to address deficits.    Examination-Activity Limitations  Locomotion Level;Other;Transfers    Examination-Participation Restrictions  Other    Stability/Clinical Decision Making  Evolving/Moderate complexity    Rehab Potential  Good    PT Frequency  2x / week    PT Duration  6 weeks    PT Treatment/Interventions  ADLs/Self Care Home Management;Cryotherapy;Electrical Stimulation;Ultrasound;Moist Heat;Therapeutic activities;Therapeutic exercise;Manual techniques;Patient/family education;Passive range of motion;Dry needling    PT Next Visit Plan  Modalites to left low back and STW/M and progress into low level core exercises with instruct in [facility] and hip bridges.    Consulted and Agree with Plan of Care  Patient       Patient will benefit from skilled therapeutic intervention in order to improve the following deficits and impairments:  Abnormal gait, Decreased activity tolerance, Pain, Postural dysfunction  Visit Diagnosis: Acute left-sided low back pain with left-sided sciatica  Abnormal posture     Problem List Patient Active Problem List   Diagnosis Date Noted  . Paroxysmal atrial fibrillation (H[facility])   . Tachycardia-bradycardia syndrome ([place])   .  Hypokalemia 04/08/2017  . Sepsis ([place]) 04/06/2017  . Essential hypertension 11/23/2015  . PACs (premature atrial contraction)  with trigeminy 06/29/2015  Standley Brooking, PTA 04/27/2019, 3:04 PM  [facility] [address] [place], [place], [address] Phone: [phone]   Fax:  [phone]  Name: Nancy Hayes MRN: [id number] Date of Birth: 09/20/1926

## 2019-04-29 ENCOUNTER — Ambulatory Visit: Payer: Medicare Other | Admitting: *Deleted

## 2019-04-29 ENCOUNTER — Other Ambulatory Visit: Payer: Self-pay

## 2019-04-29 DIAGNOSIS — M5442 Lumbago with sciatica, left side: Secondary | ICD-10-CM

## 2019-04-29 DIAGNOSIS — R293 Abnormal posture: Secondary | ICD-10-CM

## 2019-04-29 NOTE — Therapy (Signed)
[facility] [place], [place], [address] Phone: [phone]   Fax:  [phone]  Physical Therapy Treatment  Patient Details  Name: Nancy Hayes MRN: [id number] Date of Birth: 11-24-1926 Referring Provider (PT): Bing Matter PA-C.   Encounter Date: 04/29/2019  PT End of Session - 04/29/19 1352    Visit Number  3    Number of Visits  12    Date for PT Re-Evaluation  07/19/19    Authorization Type  PROGRESS NOTE AT 10TH VISIT.  KX MODIFIER AFTER 15 VISITS.    PT Start Time  1345    PT Stop Time  1432    PT Time Calculation (min)  47 min       Past Medical History:  Diagnosis Date  . AC (acromioclavicular) joint bone spurs    neck  . Arthritis   . Atrial fibrillation ([place])   . Dizzy spells   . Dysrhythmia   . Gout   . History of kidney stones   . Hypertension   . Macular degeneration of both eyes    right eye  . Renal disorder    pt denies  . UTI (lower urinary tract infection)   . Vertigo   . Vision abnormalities     Past Surgical History:  Procedure Laterality Date  . BALLOON DILATION N/A 12/05/2017   Procedure: BALLOON DILATION;  Surgeon: Carol Ada, MD;  Location: [facility];  Service: Endoscopy;  Laterality: N/A;  . CATARACT EXTRACTION    . ESOPHAGOGASTRODUODENOSCOPY (EGD) WITH PROPOFOL N/A 12/05/2017   Procedure: ESOPHAGOGASTRODUODENOSCOPY (EGD) WITH PROPOFOL;  Surgeon: Carol Ada, MD;  Location: [facility];  Service: Endoscopy;  Laterality: N/A;  . EXTRACORPOREAL SHOCK WAVE LITHOTRIPSY Right 05/26/2017   Procedure: EXTRACORPOREAL SHOCK WAVE LITHOTRIPSY (ESWL);  Surgeon: Nickie Retort, MD;  Location: [facility];  Service: Urology;  Laterality: Right;  . PARTIAL HYSTERECTOMY     pt states total hysterectomy    There were no vitals filed for this visit.  Subjective Assessment - 04/29/19 1348    Subjective  COVID 19 screening performed on patient upon arrival. Patient reports that her pain  is 6/10 but not constantly and doing better    Pertinent History  Patient will benefit from skilled physical therapy intervention to address deficits.    Diagnostic tests  X-ray.    Patient Stated Goals  Get out of pain.    Currently in Pain?  Yes    Pain Score  6     Pain Location  Back    Pain Orientation  Left    Pain Descriptors / Indicators  Shooting    Pain Type  Acute pain    Pain Onset  1 to 4 weeks ago    Pain Frequency  Intermittent                       [facility] Adult PT Treatment/Exercise - 04/29/19 0001      Modalities   Modalities  Electrical Stimulation;Moist Heat;Ultrasound      Moist Heat Therapy   Number Minutes Moist Heat  15 Minutes    Moist Heat Location  Lumbar Spine      Electrical Stimulation   Electrical Stimulation Location  L low back    Electrical Stimulation Action  premod  sitting    Electrical Stimulation Parameters  80-150hz  x 15 mins    Electrical Stimulation Goals  Pain      Ultrasound   Ultrasound  Location  L LB paras    Ultrasound Parameters  1.5 w/cm2 x 10 mins in sitting    Ultrasound Goals  Pain      Manual Therapy   Manual Therapy  Soft tissue mobilization    Soft tissue mobilization  sitting STW to L lumbar paraspinals, QL to reduce pain                PT Short Term Goals - 04/22/19 1418      PT SHORT TERM GOAL #1   Title  STG's=LTG's.        PT Long Term Goals - 04/22/19 1419      PT LONG TERM GOAL #1   Title  Independent with a HEP.    Time  6    Period  Weeks    Status  New      PT LONG TERM GOAL #2   Title  Perform ADL's with pain not > 2-3/10.    Time  6    Period  Weeks    Status  New      PT LONG TERM GOAL #3   Title  Eliminate left LE pain.    Time  6    Period  Weeks    Status  New            Plan - 04/29/19 1422    Clinical Impression Statement  Pt arrived today reporting that she is having a good day with less LBP and is getting around better. Pt did well with Rx and  reports decreased soreness during STW. Rx applied while in sitting with normal modality response.    Comorbidities  Patient will benefit from skilled physical therapy intervention to address deficits.    Stability/Clinical Decision Making  Evolving/Moderate complexity    Rehab Potential  Good    PT Frequency  2x / week    PT Duration  6 weeks    PT Treatment/Interventions  ADLs/Self Care Home Management;Cryotherapy;Electrical Stimulation;Ultrasound;Moist Heat;Therapeutic activities;Therapeutic exercise;Manual techniques;Patient/family education;Passive range of motion;Dry needling    PT Next Visit Plan  Modalites to left low back and STW/M and progress into low level core exercises with instruct in [facility] and hip bridges.       Patient will benefit from skilled therapeutic intervention in order to improve the following deficits and impairments:  Abnormal gait, Decreased activity tolerance, Pain, Postural dysfunction  Visit Diagnosis: Acute left-sided low back pain with left-sided sciatica  Abnormal posture     Problem List Patient Active Problem List   Diagnosis Date Noted  . Paroxysmal atrial fibrillation (HCC)   . Tachycardia-bradycardia syndrome ([place])   . Hypokalemia 04/08/2017  . Sepsis ([place]) 04/06/2017  . Essential hypertension 11/23/2015  . PACs (premature atrial contraction)  with trigeminy 06/29/2015    RAMSEUR,[name] , PTA 04/29/2019, 2:36 PM  [facility] [place], [place], [address] Phone: [phone]   Fax:  [phone]  Name: Nancy Hayes MRN: [id number] Date of Birth: 05/24/1927

## 2019-05-04 ENCOUNTER — Other Ambulatory Visit: Payer: Self-pay

## 2019-05-04 ENCOUNTER — Encounter: Payer: Self-pay | Admitting: Physical Therapy

## 2019-05-04 ENCOUNTER — Ambulatory Visit: Payer: Medicare Other | Attending: Family Medicine | Admitting: Physical Therapy

## 2019-05-04 DIAGNOSIS — R293 Abnormal posture: Secondary | ICD-10-CM | POA: Diagnosis present

## 2019-05-04 DIAGNOSIS — M5442 Lumbago with sciatica, left side: Secondary | ICD-10-CM | POA: Insufficient documentation

## 2019-05-04 NOTE — Therapy (Signed)
[facility] [place], [place], [address] Phone: [phone]   Fax:  [phone]  Physical Therapy Treatment  Patient Details  Name: Nancy Hayes MRN: [id number] Date of Birth: May 09, 1927 Referring Provider (PT): Bing Matter PA-C.   Encounter Date: 05/04/2019  PT End of Session - 05/04/19 1346    Visit Number  4    Number of Visits  12    Date for PT Re-Evaluation  07/19/19    Authorization Type  PROGRESS NOTE AT 10TH VISIT.  KX MODIFIER AFTER 15 VISITS.    PT Start Time  1346    PT Stop Time  1427    PT Time Calculation (min)  41 min    Activity Tolerance  Patient tolerated treatment well    Behavior During Therapy  WFL for tasks assessed/performed       Past Medical History:  Diagnosis Date  . AC (acromioclavicular) joint bone spurs    neck  . Arthritis   . Atrial fibrillation ([place])   . Dizzy spells   . Dysrhythmia   . Gout   . History of kidney stones   . Hypertension   . Macular degeneration of both eyes    right eye  . Renal disorder    pt denies  . UTI (lower urinary tract infection)   . Vertigo   . Vision abnormalities     Past Surgical History:  Procedure Laterality Date  . BALLOON DILATION N/A 12/05/2017   Procedure: BALLOON DILATION;  Surgeon: Carol Ada, MD;  Location: [facility];  Service: Endoscopy;  Laterality: N/A;  . CATARACT EXTRACTION    . ESOPHAGOGASTRODUODENOSCOPY (EGD) WITH PROPOFOL N/A 12/05/2017   Procedure: ESOPHAGOGASTRODUODENOSCOPY (EGD) WITH PROPOFOL;  Surgeon: Carol Ada, MD;  Location: [facility];  Service: Endoscopy;  Laterality: N/A;  . EXTRACORPOREAL SHOCK WAVE LITHOTRIPSY Right 05/26/2017   Procedure: EXTRACORPOREAL SHOCK WAVE LITHOTRIPSY (ESWL);  Surgeon: Nickie Retort, MD;  Location: [facility];  Service: Urology;  Laterality: Right;  . PARTIAL HYSTERECTOMY     pt states total hysterectomy    There were no vitals filed for this visit.  Subjective  Assessment - 05/04/19 1345    Subjective  COVID 19 screening performed on patient upon arrival. Reports pain predominately later in the afternoons although less overall pain.    Pertinent History  Patient will benefit from skilled physical therapy intervention to address deficits.    Patient Stated Goals  Get out of pain.    Currently in Pain?  No/denies         [facility] PT Assessment - 05/04/19 0001      Assessment   Medical Diagnosis  Left sided low back pain.    Referring Provider (PT)  Bing Matter PA-C.      Restrictions   Weight Bearing Restrictions  No                   [facility] Adult PT Treatment/Exercise - 05/04/19 0001      Modalities   Modalities  Electrical Stimulation;Moist Heat;Ultrasound      Moist Heat Therapy   Number Minutes Moist Heat  15 Minutes    Moist Heat Location  Lumbar Spine      Electrical Stimulation   Electrical Stimulation Location  L low back    Electrical Stimulation Action  Pre-Mod    Electrical Stimulation Parameters  80-150 hz x15 min    Electrical Stimulation Goals  Tone      Ultrasound  Ultrasound Location  L lumbar paraspinals/ QL    Ultrasound Parameters  Combo 1.5 w/cm2, 100%, 1 mhz x10 min    Ultrasound Goals  Other (Comment)   Muscle tone/tenderness     Manual Therapy   Manual Therapy  Soft tissue mobilization    Soft tissue mobilization  sitting STW to L lumbar paraspinals, QL to reduce muscle tightness               PT Short Term Goals - 04/22/19 1418      PT SHORT TERM GOAL #1   Title  STG's=LTG's.        PT Long Term Goals - 05/04/19 1434      PT LONG TERM GOAL #1   Title  Independent with a HEP.    Time  6    Period  Weeks    Status  On-going      PT LONG TERM GOAL #2   Title  Perform ADL's with pain not > 2-3/10.    Time  6    Period  Weeks    Status  On-going      PT LONG TERM GOAL #3   Title  Eliminate left LE pain.    Time  6    Period  Weeks    Status  Partially Met   Less  often, less intensity of pain per patient report 05/04/2019           Plan - 05/04/19 1420    Clinical Impression Statement  Patient presented in clinic with reports of less overall pain and limitation. Patient experiencing greater LBP later in the day per patient report. Patient reported having HEP from home health that she had been doing but stopped. Patient encouraged to continue HEP as exercise may be intrduced next treatment. Minimal muscle tightness palpable in L QL today with patient reporting tenderness to treated musclature as well. Normal modalities response noted following removal of the modalities.    Personal Factors and Comorbidities  Comorbidity 1    Comorbidities  Patient will benefit from skilled physical therapy intervention to address deficits.    Examination-Activity Limitations  Locomotion Level;Other;Transfers    Examination-Participation Restrictions  Other    Stability/Clinical Decision Making  Evolving/Moderate complexity    Rehab Potential  Good    PT Frequency  2x / week    PT Duration  6 weeks    PT Treatment/Interventions  ADLs/Self Care Home Management;Cryotherapy;Electrical Stimulation;Ultrasound;Moist Heat;Therapeutic activities;Therapeutic exercise;Manual techniques;Patient/family education;Passive range of motion;Dry needling    PT Next Visit Plan  Progress to Nustep with light therex as symptoms dictate.    Consulted and Agree with Plan of Care  Patient       Patient will benefit from skilled therapeutic intervention in order to improve the following deficits and impairments:  Abnormal gait, Decreased activity tolerance, Pain, Postural dysfunction  Visit Diagnosis: Acute left-sided low back pain with left-sided sciatica  Abnormal posture     Problem List Patient Active Problem List   Diagnosis Date Noted  . Paroxysmal atrial fibrillation (HCC)   . Tachycardia-bradycardia syndrome ([place])   . Hypokalemia 04/08/2017  . Sepsis ([place]) 04/06/2017  .  Essential hypertension 11/23/2015  . PACs (premature atrial contraction)  with trigeminy 06/29/2015    Standley Brooking, PTA 05/04/2019, 2:37 PM  [facility] [place], [place], [address] Phone: [phone]   Fax:  [phone]  Name: Nancy Hayes MRN: [id number] Date of Birth: 03/17/1927

## 2019-05-06 ENCOUNTER — Ambulatory Visit: Payer: Medicare Other | Admitting: Physical Therapy

## 2019-05-06 ENCOUNTER — Encounter: Payer: Self-pay | Admitting: Physical Therapy

## 2019-05-06 ENCOUNTER — Other Ambulatory Visit: Payer: Self-pay

## 2019-05-06 DIAGNOSIS — M5442 Lumbago with sciatica, left side: Secondary | ICD-10-CM | POA: Diagnosis not present

## 2019-05-06 DIAGNOSIS — R293 Abnormal posture: Secondary | ICD-10-CM

## 2019-05-06 NOTE — Therapy (Signed)
[facility] [place], [place], [address] Phone: [phone]   Fax:  [phone]  Physical Therapy Treatment  Patient Details  Name: Nancy Hayes MRN: [id number] Date of Birth: 07-Mar-1927 Referring Provider (PT): Bing Matter PA-C.   Encounter Date: 05/06/2019  PT End of Session - 05/06/19 1352    Visit Number  5    Number of Visits  12    Date for PT Re-Evaluation  07/19/19    Authorization Type  PROGRESS NOTE AT 10TH VISIT.  KX MODIFIER AFTER 15 VISITS.    PT Start Time  1351    PT Stop Time  1430    PT Time Calculation (min)  39 min    Activity Tolerance  Patient tolerated treatment well    Behavior During Therapy  WFL for tasks assessed/performed       Past Medical History:  Diagnosis Date  . AC (acromioclavicular) joint bone spurs    neck  . Arthritis   . Atrial fibrillation ([place])   . Dizzy spells   . Dysrhythmia   . Gout   . History of kidney stones   . Hypertension   . Macular degeneration of both eyes    right eye  . Renal disorder    pt denies  . UTI (lower urinary tract infection)   . Vertigo   . Vision abnormalities     Past Surgical History:  Procedure Laterality Date  . BALLOON DILATION N/A 12/05/2017   Procedure: BALLOON DILATION;  Surgeon: Carol Ada, MD;  Location: [facility];  Service: Endoscopy;  Laterality: N/A;  . CATARACT EXTRACTION    . ESOPHAGOGASTRODUODENOSCOPY (EGD) WITH PROPOFOL N/A 12/05/2017   Procedure: ESOPHAGOGASTRODUODENOSCOPY (EGD) WITH PROPOFOL;  Surgeon: Carol Ada, MD;  Location: [facility];  Service: Endoscopy;  Laterality: N/A;  . EXTRACORPOREAL SHOCK WAVE LITHOTRIPSY Right 05/26/2017   Procedure: EXTRACORPOREAL SHOCK WAVE LITHOTRIPSY (ESWL);  Surgeon: Nickie Retort, MD;  Location: [facility];  Service: Urology;  Laterality: Right;  . PARTIAL HYSTERECTOMY     pt states total hysterectomy    There were no vitals filed for this visit.  Subjective  Assessment - 05/06/19 1351    Subjective  COVID 19 screening performed on patient upon arrival. Patient reports that her back is still doing good with no pain.    Pertinent History  Patient will benefit from skilled physical therapy intervention to address deficits.    Diagnostic tests  X-ray.    Patient Stated Goals  Get out of pain.    Currently in Pain?  No/denies         [facility] PT Assessment - 05/06/19 0001      Assessment   Medical Diagnosis  Left sided low back pain.    Referring Provider (PT)  Bing Matter PA-C.      Restrictions   Weight Bearing Restrictions  No                   [facility] Adult PT Treatment/Exercise - 05/06/19 0001      Exercises   Exercises  Lumbar      Lumbar Exercises: Aerobic   Nustep  L2 x12 min      Lumbar Exercises: Seated   Long Arc Quad on Chair  Strengthening;Both;2 sets;10 reps;Weights    LAQ on Chair Weights (lbs)  2    Hip Flexion on Ball  Strengthening;Both;20 reps    Hip Flexion on Ball Limitations  2#    Sit to Stand  15 reps    Other Seated Lumbar Exercises  B clam red theraband x20 reps    Other Seated Lumbar Exercises  B shoulder row x20 reps, lumbar extension red theraband x10 reps      Modalities   Modalities  Electrical Stimulation;Moist Heat      Moist Heat Therapy   Number Minutes Moist Heat  15 Minutes    Moist Heat Location  Lumbar Spine      Electrical Stimulation   Electrical Stimulation Location  L low back    Electrical Stimulation Action  Pre-Mod    Electrical Stimulation Parameters  80-150 hz x15 min    Electrical Stimulation Goals  Tone               PT Short Term Goals - 04/22/19 1418      PT SHORT TERM GOAL #1   Title  STG's=LTG's.        PT Long Term Goals - 05/04/19 1434      PT LONG TERM GOAL #1   Title  Independent with a HEP.    Time  6    Period  Weeks    Status  On-going      PT LONG TERM GOAL #2   Title  Perform ADL's with pain not > 2-3/10.    Time  6    Period   Weeks    Status  On-going      PT LONG TERM GOAL #3   Title  Eliminate left LE pain.    Time  6    Period  Weeks    Status  Partially Met   Less often, less intensity of pain per patient report 05/04/2019           Plan - 05/06/19 1435    Clinical Impression Statement  Patient presented in clinic with no complaints of any pain. Patient continues to have some L LBP intermittantly but less overall. Patient able to complete therex today with only reports of fatigue. VCs for erect posture provided during treatment. Lack of eccentric strength in B quad per observation during sit<> stand. Normal modalities response noted following removal of the modalities.    Personal Factors and Comorbidities  Comorbidity 1    Comorbidities  Patient will benefit from skilled physical therapy intervention to address deficits.    Examination-Activity Limitations  Locomotion Level;Other;Transfers    Examination-Participation Restrictions  Other    Stability/Clinical Decision Making  Evolving/Moderate complexity    Rehab Potential  Good    PT Frequency  2x / week    PT Duration  6 weeks    PT Treatment/Interventions  ADLs/Self Care Home Management;Cryotherapy;Electrical Stimulation;Ultrasound;Moist Heat;Therapeutic activities;Therapeutic exercise;Manual techniques;Patient/family education;Passive range of motion;Dry needling    PT Next Visit Plan  Progress to Nustep with light therex as symptoms dictate.    Consulted and Agree with Plan of Care  Patient       Patient will benefit from skilled therapeutic intervention in order to improve the following deficits and impairments:  Abnormal gait, Decreased activity tolerance, Pain, Postural dysfunction  Visit Diagnosis: Acute left-sided low back pain with left-sided sciatica  Abnormal posture     Problem List Patient Active Problem List   Diagnosis Date Noted  . Paroxysmal atrial fibrillation (HCC)   . Tachycardia-bradycardia syndrome ([place])   .  Hypokalemia 04/08/2017  . Sepsis ([place]) 04/06/2017  . Essential hypertension 11/23/2015  . PACs (premature atrial contraction)  with trigeminy 06/29/2015    Standley Brooking,  PTA 05/06/2019, 2:38 PM  [facility] [place], [place], [address] Phone: [phone]   Fax:  [phone]  Name: Nancy Hayes MRN: [id number] Date of Birth: [birth year]

## 2019-05-08 ENCOUNTER — Other Ambulatory Visit: Payer: Self-pay | Admitting: Cardiovascular Disease

## 2019-05-11 ENCOUNTER — Encounter: Payer: Medicare Other | Admitting: Physical Therapy

## 2019-05-13 ENCOUNTER — Encounter: Payer: Medicare Other | Admitting: Physical Therapy

## 2019-05-18 ENCOUNTER — Ambulatory Visit: Payer: Medicare Other | Admitting: *Deleted

## 2019-05-18 ENCOUNTER — Other Ambulatory Visit: Payer: Self-pay

## 2019-05-18 ENCOUNTER — Encounter: Payer: Self-pay | Admitting: *Deleted

## 2019-05-18 DIAGNOSIS — R293 Abnormal posture: Secondary | ICD-10-CM

## 2019-05-18 DIAGNOSIS — M5442 Lumbago with sciatica, left side: Secondary | ICD-10-CM | POA: Diagnosis not present

## 2019-05-18 NOTE — Therapy (Signed)
[facility] [place], [place], [address] Phone: [phone]   Fax:  [phone]  Physical Therapy Treatment  Patient Details  Name: Nancy Hayes MRN: [id number] Date of Birth: 07-12-1927 Referring Provider (PT): Bing Matter PA-C.   Encounter Date: 05/18/2019  PT End of Session - 05/18/19 1517    Visit Number  6    Number of Visits  12    Date for PT Re-Evaluation  07/19/19    Authorization Type  PROGRESS NOTE AT 10TH VISIT.  KX MODIFIER AFTER 15 VISITS.    PT Start Time  1345    PT Stop Time  1435    PT Time Calculation (min)  50 min       Past Medical History:  Diagnosis Date  . AC (acromioclavicular) joint bone spurs    neck  . Arthritis   . Atrial fibrillation ([place])   . Dizzy spells   . Dysrhythmia   . Gout   . History of kidney stones   . Hypertension   . Macular degeneration of both eyes    right eye  . Renal disorder    pt denies  . UTI (lower urinary tract infection)   . Vertigo   . Vision abnormalities     Past Surgical History:  Procedure Laterality Date  . BALLOON DILATION N/A 12/05/2017   Procedure: BALLOON DILATION;  Surgeon: Carol Ada, MD;  Location: [facility];  Service: Endoscopy;  Laterality: N/A;  . CATARACT EXTRACTION    . ESOPHAGOGASTRODUODENOSCOPY (EGD) WITH PROPOFOL N/A 12/05/2017   Procedure: ESOPHAGOGASTRODUODENOSCOPY (EGD) WITH PROPOFOL;  Surgeon: Carol Ada, MD;  Location: [facility];  Service: Endoscopy;  Laterality: N/A;  . EXTRACORPOREAL SHOCK WAVE LITHOTRIPSY Right 05/26/2017   Procedure: EXTRACORPOREAL SHOCK WAVE LITHOTRIPSY (ESWL);  Surgeon: Nickie Retort, MD;  Location: [facility];  Service: Urology;  Laterality: Right;  . PARTIAL HYSTERECTOMY     pt states total hysterectomy    There were no vitals filed for this visit.  Subjective Assessment - 05/18/19 1403    Subjective  COVID 19 screening performed on patient upon arrival. Patient reports that her back  is still doing good with no pain today    Pertinent History  Patient will benefit from skilled physical therapy intervention to address deficits.    Patient Stated Goals  Get out of pain.    Currently in Pain?  No/denies                       [facility] Adult PT Treatment/Exercise - 05/18/19 0001      Exercises   Exercises  Lumbar      Lumbar Exercises: Aerobic   Nustep  L3 x12 min      Lumbar Exercises: Seated   Long Arc Quad on Chair  Strengthening;Both;10 reps;Weights;3 sets    LAQ on Chair Weights (lbs)  2    Hip Flexion on Ball  Strengthening;Both;20 reps    Sit to Stand  20 reps    Other Seated Lumbar Exercises  B clam red theraband x20 reps    Other Seated Lumbar Exercises  B shoulder row x20 reps, lumbar extension red theraband x10 reps      Modalities   Modalities  Electrical Stimulation;Moist Heat      Moist Heat Therapy   Number Minutes Moist Heat  15 Minutes    Moist Heat Location  Lumbar Spine      Electrical Stimulation   Electrical Stimulation  Location  L low back    Electrical Stimulation Action  premod seated    Electrical Stimulation Parameters  80-'150hz'  x 15 mins    Electrical Stimulation Goals  Tone               PT Short Term Goals - 04/22/19 1418      PT SHORT TERM GOAL #1   Title  STG's=LTG's.        PT Long Term Goals - 05/04/19 1434      PT LONG TERM GOAL #1   Title  Independent with a HEP.    Time  6    Period  Weeks    Status  On-going      PT LONG TERM GOAL #2   Title  Perform ADL's with pain not > 2-3/10.    Time  6    Period  Weeks    Status  On-going      PT LONG TERM GOAL #3   Title  Eliminate left LE pain.    Time  6    Period  Weeks    Status  Partially Met   Less often, less intensity of pain per patient report 05/04/2019           Plan - 05/18/19 1741    Clinical Impression Statement  Patient arrived today feeling fairly well with minimal LBP and was able to complete progression of exs  without complaint. LB soreness was CC today. Normal modality response today.    Personal Factors and Comorbidities  Comorbidity 1    Comorbidities  Patient will benefit from skilled physical therapy intervention to address deficits.    Examination-Activity Limitations  Locomotion Level;Other;Transfers    Rehab Potential  Good    PT Frequency  2x / week    PT Duration  6 weeks    PT Treatment/Interventions  ADLs/Self Care Home Management;Cryotherapy;Electrical Stimulation;Ultrasound;Moist Heat;Therapeutic activities;Therapeutic exercise;Manual techniques;Patient/family education;Passive range of motion;Dry needling    PT Next Visit Plan  Progress to Nustep with light therex as symptoms dictate.    Consulted and Agree with Plan of Care  Patient       Patient will benefit from skilled therapeutic intervention in order to improve the following deficits and impairments:  Abnormal gait, Decreased activity tolerance, Pain, Postural dysfunction  Visit Diagnosis: Acute left-sided low back pain with left-sided sciatica  Abnormal posture     Problem List Patient Active Problem List   Diagnosis Date Noted  . Paroxysmal atrial fibrillation (HCC)   . Tachycardia-bradycardia syndrome ([place])   . Hypokalemia 04/08/2017  . Sepsis ([place]) 04/06/2017  . Essential hypertension 11/23/2015  . PACs (premature atrial contraction)  with trigeminy 06/29/2015    RAMSEUR,[name], PTA 05/18/2019, 5:43 PM  [facility] [place], [place], [address] Phone: [phone]   Fax:  [phone]  Name: FABIENNE NOLASCO MRN: [id number] Date of Birth: [birth year]

## 2019-05-20 ENCOUNTER — Ambulatory Visit: Payer: Medicare Other | Admitting: Physical Therapy

## 2019-05-20 ENCOUNTER — Other Ambulatory Visit: Payer: Self-pay

## 2019-05-20 ENCOUNTER — Encounter: Payer: Self-pay | Admitting: Physical Therapy

## 2019-05-20 DIAGNOSIS — M5442 Lumbago with sciatica, left side: Secondary | ICD-10-CM | POA: Diagnosis not present

## 2019-05-20 DIAGNOSIS — R293 Abnormal posture: Secondary | ICD-10-CM

## 2019-05-20 NOTE — Therapy (Signed)
[facility] [place], [place], [address] Phone: [phone]   Fax:  [phone]  Physical Therapy Treatment  Patient Details  Name: Nancy Hayes MRN: [id number] Date of Birth: 1927-02-28 Referring Provider (PT): Bing Matter PA-C.   Encounter Date: 05/20/2019  PT End of Session - 05/20/19 1352    Visit Number  7    Number of Visits  12    Date for PT Re-Evaluation  07/19/19    Authorization Type  PROGRESS NOTE AT 10TH VISIT.  KX MODIFIER AFTER 15 VISITS.    PT Start Time  1347    PT Stop Time  1427    PT Time Calculation (min)  40 min    Activity Tolerance  Patient tolerated treatment well    Behavior During Therapy  WFL for tasks assessed/performed       Past Medical History:  Diagnosis Date  . AC (acromioclavicular) joint bone spurs    neck  . Arthritis   . Atrial fibrillation ([place])   . Dizzy spells   . Dysrhythmia   . Gout   . History of kidney stones   . Hypertension   . Macular degeneration of both eyes    right eye  . Renal disorder    pt denies  . UTI (lower urinary tract infection)   . Vertigo   . Vision abnormalities     Past Surgical History:  Procedure Laterality Date  . BALLOON DILATION N/A 12/05/2017   Procedure: BALLOON DILATION;  Surgeon: Carol Ada, MD;  Location: [facility];  Service: Endoscopy;  Laterality: N/A;  . CATARACT EXTRACTION    . ESOPHAGOGASTRODUODENOSCOPY (EGD) WITH PROPOFOL N/A 12/05/2017   Procedure: ESOPHAGOGASTRODUODENOSCOPY (EGD) WITH PROPOFOL;  Surgeon: Carol Ada, MD;  Location: [facility];  Service: Endoscopy;  Laterality: N/A;  . EXTRACORPOREAL SHOCK WAVE LITHOTRIPSY Right 05/26/2017   Procedure: EXTRACORPOREAL SHOCK WAVE LITHOTRIPSY (ESWL);  Surgeon: Nickie Retort, MD;  Location: [facility];  Service: Urology;  Laterality: Right;  . PARTIAL HYSTERECTOMY     pt states total hysterectomy    There were no vitals filed for this visit.  Subjective  Assessment - 05/20/19 1351    Subjective  COVID 19 screening performed on patient upon arrival. Patient denies any current pain but does report a twinge of pain yesterday while walking at home.    Pertinent History  Patient will benefit from skilled physical therapy intervention to address deficits.    Diagnostic tests  X-ray.    Patient Stated Goals  Get out of pain.    Currently in Pain?  No/denies         [facility] PT Assessment - 05/20/19 0001      Assessment   Medical Diagnosis  Left sided low back pain.    Referring Provider (PT)  Bing Matter PA-C.      Restrictions   Weight Bearing Restrictions  No                   [facility] Adult PT Treatment/Exercise - 05/20/19 0001      Exercises   Exercises  Lumbar;Knee/Hip      Lumbar Exercises: Aerobic   Nustep  L3 x18 min      Lumbar Exercises: Standing   Other Standing Lumbar Exercises  sidestepping at countertops x3 RT    Other Standing Lumbar Exercises  Hip marching at countertop x20 reps      Lumbar Exercises: [place] on Chair  Strengthening;Both;2 sets;10 reps;Weights    LAQ on Chair Weights (lbs)  3    Hip Flexion on Ball  Strengthening;Both;20 reps    Sit to Stand  5 reps    Other Seated Lumbar Exercises  B clam red theraband x20 reps; B HS curl red theraband x20 reps    Other Seated Lumbar Exercises  B shoulder extension red theraband x20 rpes      Lumbar Exercises: Supine   Bridge  20 reps;2 seconds      Knee/Hip Exercises: Seated   Hamstring Curl  Strengthening;Both;2 sets;10 reps;Limitations    Hamstring Limitations  red theraband               PT Short Term Goals - 04/22/19 1418      PT SHORT TERM GOAL #1   Title  STG's=LTG's.        PT Long Term Goals - 05/04/19 1434      PT LONG TERM GOAL #1   Title  Independent with a HEP.    Time  6    Period  Weeks    Status  On-going      PT LONG TERM GOAL #2   Title  Perform ADL's with pain not > 2-3/10.    Time  6     Period  Weeks    Status  On-going      PT LONG TERM GOAL #3   Title  Eliminate left LE pain.    Time  6    Period  Weeks    Status  Partially Met   Less often, less intensity of pain per patient report 05/04/2019           Plan - 05/20/19 1434    Clinical Impression Statement  Patient presented in clinic with no LBP or reports of limitations with ADLs. Patient able to complete therex with no negetive complaints. VCs for erect sitting posture provided in order to promote core involvement. Patient observed with trunk flexion during ambulation and her walker height was adjusted to allow her to stand more erect. Patient reported feeling more comfortable after adjusting walker height.    Personal Factors and Comorbidities  Comorbidity 1    Comorbidities  Patient will benefit from skilled physical therapy intervention to address deficits.    Examination-Activity Limitations  Locomotion Level;Other;Transfers    Examination-Participation Restrictions  Other    Stability/Clinical Decision Making  Evolving/Moderate complexity    Rehab Potential  Good    PT Frequency  2x / week    PT Duration  6 weeks    PT Treatment/Interventions  ADLs/Self Care Home Management;Cryotherapy;Electrical Stimulation;Ultrasound;Moist Heat;Therapeutic activities;Therapeutic exercise;Manual techniques;Patient/family education;Passive range of motion;Dry needling    PT Next Visit Plan  Progress to Nustep with light therex as symptoms dictate.    Consulted and Agree with Plan of Care  Patient       Patient will benefit from skilled therapeutic intervention in order to improve the following deficits and impairments:  Abnormal gait, Decreased activity tolerance, Pain, Postural dysfunction  Visit Diagnosis: Acute left-sided low back pain with left-sided sciatica  Abnormal posture     Problem List Patient Active Problem List   Diagnosis Date Noted  . Paroxysmal atrial fibrillation (HCC)   .  Tachycardia-bradycardia syndrome ([place])   . Hypokalemia 04/08/2017  . Sepsis ([place]) 04/06/2017  . Essential hypertension 11/23/2015  . PACs (premature atrial contraction)  with trigeminy 06/29/2015    Standley Brooking, PTA 05/20/2019, 3:08 PM  [facility] [place], [place], [address] Phone: [phone]   Fax:  [phone]  Name: Nancy Hayes MRN: [id number] Date of Birth: Jun 20, 1927

## 2019-05-25 ENCOUNTER — Ambulatory Visit: Payer: Medicare Other | Admitting: *Deleted

## 2019-05-25 ENCOUNTER — Encounter: Payer: Self-pay | Admitting: *Deleted

## 2019-05-25 ENCOUNTER — Other Ambulatory Visit: Payer: Self-pay

## 2019-05-25 DIAGNOSIS — R293 Abnormal posture: Secondary | ICD-10-CM

## 2019-05-25 DIAGNOSIS — M5442 Lumbago with sciatica, left side: Secondary | ICD-10-CM

## 2019-05-25 NOTE — Therapy (Signed)
[facility] [place], [place], [address] Phone: [phone]   Fax:  [phone]  Physical Therapy Treatment  Patient Details  Name: Nancy Hayes MRN: [id number] Date of Birth: [birth year] Referring Provider (PT): Bing Matter PA-C.   Encounter Date: 05/25/2019  PT End of Session - 05/25/19 1429    Visit Number  8    Number of Visits  12    Date for PT Re-Evaluation  07/19/19    Authorization Type  PROGRESS NOTE AT 10TH VISIT.  KX MODIFIER AFTER 15 VISITS.    PT Start Time  1346    PT Stop Time  1436    PT Time Calculation (min)  50 min       Past Medical History:  Diagnosis Date  . AC (acromioclavicular) joint bone spurs    neck  . Arthritis   . Atrial fibrillation ([place])   . Dizzy spells   . Dysrhythmia   . Gout   . History of kidney stones   . Hypertension   . Macular degeneration of both eyes    right eye  . Renal disorder    pt denies  . UTI (lower urinary tract infection)   . Vertigo   . Vision abnormalities     Past Surgical History:  Procedure Laterality Date  . BALLOON DILATION N/A 12/05/2017   Procedure: BALLOON DILATION;  Surgeon: Carol Ada, MD;  Location: [facility];  Service: Endoscopy;  Laterality: N/A;  . CATARACT EXTRACTION    . ESOPHAGOGASTRODUODENOSCOPY (EGD) WITH PROPOFOL N/A 12/05/2017   Procedure: ESOPHAGOGASTRODUODENOSCOPY (EGD) WITH PROPOFOL;  Surgeon: Carol Ada, MD;  Location: [facility];  Service: Endoscopy;  Laterality: N/A;  . EXTRACORPOREAL SHOCK WAVE LITHOTRIPSY Right 05/26/2017   Procedure: EXTRACORPOREAL SHOCK WAVE LITHOTRIPSY (ESWL);  Surgeon: Nickie Retort, MD;  Location: [facility];  Service: Urology;  Laterality: Right;  . PARTIAL HYSTERECTOMY     pt states total hysterectomy    There were no vitals filed for this visit.                    [facility] Adult PT Treatment/Exercise - 05/25/19 0001      Exercises   Exercises  Lumbar;Knee/Hip      Lumbar Exercises: Aerobic   Nustep  L4 x15 min      Lumbar Exercises: Standing   Other Standing Lumbar Exercises  sidestepping  in bars with SBA/CGA    Other Standing Lumbar Exercises  Hip marching  2x20 reps, and  toe taps      Lumbar Exercises: Seated   Long Arc Quad on Chair  Strengthening;Both;2 sets;10 reps;Weights    LAQ on Chair Weights (lbs)  3    Sit to Stand  10 reps    Other Seated Lumbar Exercises  B clam red theraband x20 reps; B HS curl red theraband x20 reps    Other Seated Lumbar Exercises  B shoulder extension red theraband x20 rpes      Knee/Hip Exercises: Seated   Hamstring Curl  Strengthening;Both;2 sets;10 reps;Limitations    Hamstring Limitations  red theraband      Modalities   Modalities  Electrical Stimulation;Moist Heat      Moist Heat Therapy   Number Minutes Moist Heat  15 Minutes    Moist Heat Location  Lumbar Spine      Electrical Stimulation   Electrical Stimulation Location  L low back    Electrical Stimulation Action  premod seated  Electrical Stimulation Parameters  80-'150hz'  x15 mins    Electrical Stimulation Goals  Tone               PT Short Term Goals - 04/22/19 1418      PT SHORT TERM GOAL #1   Title  STG's=LTG's.        PT Long Term Goals - 05/04/19 1434      PT LONG TERM GOAL #1   Title  Independent with a HEP.    Time  6    Period  Weeks    Status  On-going      PT LONG TERM GOAL #2   Title  Perform ADL's with pain not > 2-3/10.    Time  6    Period  Weeks    Status  On-going      PT LONG TERM GOAL #3   Title  Eliminate left LE pain.    Time  6    Period  Weeks    Status  Partially Met   Less often, less intensity of pain per patient report 05/04/2019           Plan - 05/25/19 1437    Clinical Impression Statement  Pt arrived today doing fairly well with mainly soreness in LB. She was able to complete all standing and sitting strengthening and balance act.'s with mainly fatigue. Normal modality  response.    Personal Factors and Comorbidities  Comorbidity 1    Comorbidities  Patient will benefit from skilled physical therapy intervention to address deficits.    Examination-Activity Limitations  Locomotion Level;Other;Transfers    Examination-Participation Restrictions  Other    Stability/Clinical Decision Making  Evolving/Moderate complexity    Rehab Potential  Good    PT Frequency  2x / week    PT Duration  6 weeks    PT Treatment/Interventions  ADLs/Self Care Home Management;Cryotherapy;Electrical Stimulation;Ultrasound;Moist Heat;Therapeutic activities;Therapeutic exercise;Manual techniques;Patient/family education;Passive range of motion;Dry needling    PT Next Visit Plan  Progress to Nustep with light therex as symptoms dictate.    Consulted and Agree with Plan of Care  Patient       Patient will benefit from skilled therapeutic intervention in order to improve the following deficits and impairments:  Abnormal gait, Decreased activity tolerance, Pain, Postural dysfunction  Visit Diagnosis: Acute left-sided low back pain with left-sided sciatica  Abnormal posture     Problem List Patient Active Problem List   Diagnosis Date Noted  . Paroxysmal atrial fibrillation (HCC)   . Tachycardia-bradycardia syndrome ([place])   . Hypokalemia 04/08/2017  . Sepsis ([place]) 04/06/2017  . Essential hypertension 11/23/2015  . PACs (premature atrial contraction)  with trigeminy 06/29/2015    Nancy Hayes,[name], PTA 05/25/2019, 2:43 PM  [facility] [place], [place], [address] Phone: [phone]   Fax:  [phone]  Name: Nancy Hayes MRN: [id number] Date of Birth: 02/23/1927

## 2019-05-27 ENCOUNTER — Encounter: Payer: Self-pay | Admitting: Physical Therapy

## 2019-05-27 ENCOUNTER — Other Ambulatory Visit: Payer: Self-pay

## 2019-05-27 ENCOUNTER — Ambulatory Visit: Payer: Medicare Other | Admitting: Physical Therapy

## 2019-05-27 DIAGNOSIS — M5442 Lumbago with sciatica, left side: Secondary | ICD-10-CM | POA: Diagnosis not present

## 2019-05-27 DIAGNOSIS — R293 Abnormal posture: Secondary | ICD-10-CM

## 2019-05-27 NOTE — Therapy (Signed)
[facility] [place], [place], [address] Phone: [phone]   Fax:  [phone]  Physical Therapy Treatment  Patient Details  Name: Nancy Hayes MRN: [id number] Date of Birth: [birth year] Referring Provider (PT): Bing Matter PA-C.   Encounter Date: 05/27/2019  PT End of Session - 05/27/19 1401    Visit Number  9    Number of Visits  12    Date for PT Re-Evaluation  07/19/19    Authorization Type  PROGRESS NOTE AT 10TH VISIT.  KX MODIFIER AFTER 15 VISITS.    PT Start Time  1350    PT Stop Time  1432    PT Time Calculation (min)  42 min    Activity Tolerance  Patient tolerated treatment well    Behavior During Therapy  WFL for tasks assessed/performed       Past Medical History:  Diagnosis Date  . AC (acromioclavicular) joint bone spurs    neck  . Arthritis   . Atrial fibrillation ([place])   . Dizzy spells   . Dysrhythmia   . Gout   . History of kidney stones   . Hypertension   . Macular degeneration of both eyes    right eye  . Renal disorder    pt denies  . UTI (lower urinary tract infection)   . Vertigo   . Vision abnormalities     Past Surgical History:  Procedure Laterality Date  . BALLOON DILATION N/A 12/05/2017   Procedure: BALLOON DILATION;  Surgeon: Carol Ada, MD;  Location: [facility];  Service: Endoscopy;  Laterality: N/A;  . CATARACT EXTRACTION    . ESOPHAGOGASTRODUODENOSCOPY (EGD) WITH PROPOFOL N/A 12/05/2017   Procedure: ESOPHAGOGASTRODUODENOSCOPY (EGD) WITH PROPOFOL;  Surgeon: Carol Ada, MD;  Location: [facility];  Service: Endoscopy;  Laterality: N/A;  . EXTRACORPOREAL SHOCK WAVE LITHOTRIPSY Right 05/26/2017   Procedure: EXTRACORPOREAL SHOCK WAVE LITHOTRIPSY (ESWL);  Surgeon: Nickie Retort, MD;  Location: [facility];  Service: Urology;  Laterality: Right;  . PARTIAL HYSTERECTOMY     pt states total hysterectomy    There were no vitals filed for this visit.  Subjective  Assessment - 05/27/19 1401    Subjective  COVID 19 screening performed on patient upon arrival. Denies any LBP upon arrival.    Pertinent History  Patient will benefit from skilled physical therapy intervention to address deficits.    Diagnostic tests  X-ray.    Patient Stated Goals  Get out of pain.    Currently in Pain?  No/denies         [facility] PT Assessment - 05/27/19 0001      Assessment   Medical Diagnosis  Left sided low back pain.    Referring Provider (PT)  Bing Matter PA-C.      Restrictions   Weight Bearing Restrictions  No                   [facility] Adult PT Treatment/Exercise - 05/27/19 0001      Lumbar Exercises: Aerobic   Nustep  L4 x15 min      Lumbar Exercises: Standing   Other Standing Lumbar Exercises  Hip abduction x20 reps each LE    Other Standing Lumbar Exercises  Marching x20 reps each      Lumbar Exercises: Seated   Long Arc Quad on Chair  Strengthening;Both;2 sets;10 reps;Weights    LAQ on Chair Weights (lbs)  4    Sit to Stand  15 reps  Other Seated Lumbar Exercises  B clam green theraband x20 reps; B HS curl green theraband x20 reps    Other Seated Lumbar Exercises  B shoulder row green theraband x20 reps      Knee/Hip Exercises: Seated   Ball Squeeze  x20 reps               PT Short Term Goals - 04/22/19 1418      PT SHORT TERM GOAL #1   Title  STG's=LTG's.        PT Long Term Goals - 05/04/19 1434      PT LONG TERM GOAL #1   Title  Independent with a HEP.    Time  6    Period  Weeks    Status  On-going      PT LONG TERM GOAL #2   Title  Perform ADL's with pain not > 2-3/10.    Time  6    Period  Weeks    Status  On-going      PT LONG TERM GOAL #3   Title  Eliminate left LE pain.    Time  6    Period  Weeks    Status  Partially Met   Less often, less intensity of pain per patient report 05/04/2019           Plan - 05/27/19 1442    Clinical Impression Statement  Patient continues to tolerate  treatment well. No complaints of LBP with activity or ambulation but states she would be limited with prolonged standing as back pain would probably begin. Patient provided VCs for erect posture for more functional posture awareness during therex.    Personal Factors and Comorbidities  Comorbidity 1    Comorbidities  Patient will benefit from skilled physical therapy intervention to address deficits.    Examination-Activity Limitations  Locomotion Level;Other;Transfers    Examination-Participation Restrictions  Other    Stability/Clinical Decision Making  Evolving/Moderate complexity    Rehab Potential  Good    PT Frequency  2x / week    PT Duration  6 weeks    PT Treatment/Interventions  ADLs/Self Care Home Management;Cryotherapy;Electrical Stimulation;Ultrasound;Moist Heat;Therapeutic activities;Therapeutic exercise;Manual techniques;Patient/family education;Passive range of motion;Dry needling    PT Next Visit Plan  Progress to Nustep with light therex as symptoms dictate.    Consulted and Agree with Plan of Care  Patient       Patient will benefit from skilled therapeutic intervention in order to improve the following deficits and impairments:  Abnormal gait, Decreased activity tolerance, Pain, Postural dysfunction  Visit Diagnosis: Acute left-sided low back pain with left-sided sciatica  Abnormal posture     Problem List Patient Active Problem List   Diagnosis Date Noted  . Paroxysmal atrial fibrillation (HCC)   . Tachycardia-bradycardia syndrome ([place])   . Hypokalemia 04/08/2017  . Sepsis ([place]) 04/06/2017  . Essential hypertension 11/23/2015  . PACs (premature atrial contraction)  with trigeminy 06/29/2015    Standley Brooking, PTA 05/27/2019, 2:45 PM  [facility] [place], [place], [address] Phone: [phone]   Fax:  [phone]  Name: RHAPSODY WOLVEN MRN: [id number] Date of Birth: 1927/07/29

## 2019-06-01 ENCOUNTER — Encounter: Payer: Self-pay | Admitting: Physical Therapy

## 2019-06-01 ENCOUNTER — Ambulatory Visit: Payer: Medicare Other | Admitting: Physical Therapy

## 2019-06-01 ENCOUNTER — Other Ambulatory Visit: Payer: Self-pay

## 2019-06-01 DIAGNOSIS — M5442 Lumbago with sciatica, left side: Secondary | ICD-10-CM | POA: Diagnosis not present

## 2019-06-01 DIAGNOSIS — R293 Abnormal posture: Secondary | ICD-10-CM

## 2019-06-01 NOTE — Therapy (Signed)
[name], [place], [address] Phone: [phone]   Fax:  [phone]  Physical Therapy Treatment  Progress Note Reporting Period 04/22/2019 to 06/01/2019  See note below for Objective Data and Assessment of Progress/Goals. Patient has made significant improvements but still has pain with prolonged standing. Nancy Hayes, PT, DPT     Patient Details  Name: Nancy Hayes MRN: [id number] Date of Birth: 1927-05-30 Referring Provider (PT): Bing Matter PA-C.   Encounter Date: 06/01/2019  PT End of Session - 06/01/19 1347    Visit Number  10    Number of Visits  12    Date for PT Re-Evaluation  07/19/19    Authorization Type  PROGRESS NOTE AT 10TH VISIT.  KX MODIFIER AFTER 15 VISITS.    PT Start Time  1344    PT Stop Time  1428    PT Time Calculation (min)  44 min    Activity Tolerance  Patient tolerated treatment well    Behavior During Therapy  WFL for tasks assessed/performed       Past Medical History:  Diagnosis Date  . AC (acromioclavicular) joint bone spurs    neck  . Arthritis   . Atrial fibrillation ([place])   . Dizzy spells   . Dysrhythmia   . Gout   . History of kidney stones   . Hypertension   . Macular degeneration of both eyes    right eye  . Renal disorder    pt denies  . UTI (lower urinary tract infection)   . Vertigo   . Vision abnormalities     Past Surgical History:  Procedure Laterality Date  . BALLOON DILATION N/A 12/05/2017   Procedure: BALLOON DILATION;  Surgeon: Carol Ada, MD;  Location: [facility];  Service: Endoscopy;  Laterality: N/A;  . CATARACT EXTRACTION    . ESOPHAGOGASTRODUODENOSCOPY (EGD) WITH PROPOFOL N/A 12/05/2017   Procedure: ESOPHAGOGASTRODUODENOSCOPY (EGD) WITH PROPOFOL;  Surgeon: Carol Ada, MD;  Location: [facility];  Service: Endoscopy;  Laterality: N/A;  . EXTRACORPOREAL SHOCK WAVE LITHOTRIPSY Right 05/26/2017   Procedure: EXTRACORPOREAL SHOCK  WAVE LITHOTRIPSY (ESWL);  Surgeon: Nickie Retort, MD;  Location: [facility];  Service: Urology;  Laterality: Right;  . PARTIAL HYSTERECTOMY     pt states total hysterectomy    There were no vitals filed for this visit.  Subjective Assessment - 06/01/19 1346    Subjective  COVID 19 screening performed on patient upon arrival. Patient reports feeling good with no pain    Pertinent History  Patient will benefit from skilled physical therapy intervention to address deficits.    Diagnostic tests  X-ray.    Patient Stated Goals  Get out of pain.    Currently in Pain?  No/denies         [facility] PT Assessment - 06/01/19 0001      Assessment   Medical Diagnosis  Left sided low back pain.    Referring Provider (PT)  Bing Matter PA-C.      Restrictions   Weight Bearing Restrictions  No                   [facility] Adult PT Treatment/Exercise - 06/01/19 0001      Exercises   Exercises  Lumbar;Knee/Hip      Lumbar Exercises: Aerobic   Nustep  L4 x15 min      Lumbar Exercises: Standing   Other Standing Lumbar Exercises  Hip abduction x20 reps each LE  Other Standing Lumbar Exercises  Marching x20 reps each      Lumbar Exercises: Seated   Long Arc Quad on Chair  Strengthening;Both;2 sets;10 reps;Weights    LAQ on Chair Weights (lbs)  4    LAQ on Chair Limitations  --    Sit to Stand  15 reps    Other Seated Lumbar Exercises  B clam green theraband x20 reps; B HS curl green theraband x20 reps    Other Seated Lumbar Exercises  B shoulder row green theraband x20 reps; horizontal abduction x20 green band, D2 flexion x20 each green theraband.      Knee/Hip Exercises: Standing   Rocker Board  2 minutes    Other Standing Knee Exercises  lateral stepping x2 minutes       Knee/Hip Exercises: Seated   Ball Squeeze  x20 reps               PT Short Term Goals - 04/22/19 1418      PT SHORT TERM GOAL #1   Title  STG's=LTG's.        PT Long Term Goals - 06/01/19  1347      PT LONG TERM GOAL #1   Title  Independent with a HEP.    Time  6    Period  Weeks    Status  Achieved      PT LONG TERM GOAL #2   Title  Perform ADL's with pain not > 2-3/10.    Period  Weeks    Status  Achieved      PT LONG TERM GOAL #3   Title  Eliminate left LE pain.    Period  Weeks    Status  Achieved            Plan - 06/01/19 1611    Clinical Impression Statement  Patient responded well to therapy session with no complaints of pain. Verbal cuing required for upright posture and for proper form and was able to maintain after cuing. Patient has met all goals and reports no neurological symptoms. Patient to complete remaining visits to review HEP.    Personal Factors and Comorbidities  Comorbidity 1    Comorbidities  Patient will benefit from skilled physical therapy intervention to address deficits.    Examination-Activity Limitations  Locomotion Level;Other;Transfers    Examination-Participation Restrictions  Other    Stability/Clinical Decision Making  Evolving/Moderate complexity    Clinical Decision Making  Low    Rehab Potential  Good    PT Frequency  2x / week    PT Duration  6 weeks    PT Treatment/Interventions  ADLs/Self Care Home Management;Cryotherapy;Electrical Stimulation;Ultrasound;Moist Heat;Therapeutic activities;Therapeutic exercise;Manual techniques;Patient/family education;Passive range of motion;Dry needling    PT Next Visit Plan  create advanced HEP    Consulted and Agree with Plan of Care  Patient       Patient will benefit from skilled therapeutic intervention in order to improve the following deficits and impairments:  Abnormal gait, Decreased activity tolerance, Pain, Postural dysfunction  Visit Diagnosis: Acute left-sided low back pain with left-sided sciatica  Abnormal posture     Problem List Patient Active Problem List   Diagnosis Date Noted  . Paroxysmal atrial fibrillation (HCC)   . Tachycardia-bradycardia syndrome  ([place])   . Hypokalemia 04/08/2017  . Sepsis ([place]) 04/06/2017  . Essential hypertension 11/23/2015  . PACs (premature atrial contraction)  with trigeminy 06/29/2015    Nancy Hayes 06/01/2019, 4:26 PM   [facility]  [place], [place], [address] Phone: [phone]   Fax:  [phone]  Name: Nancy Hayes MRN: [id number] Date of Birth: 12/27/1926

## 2019-06-03 ENCOUNTER — Other Ambulatory Visit: Payer: Self-pay

## 2019-06-03 ENCOUNTER — Ambulatory Visit: Payer: Medicare Other | Attending: Family Medicine | Admitting: Physical Therapy

## 2019-06-03 ENCOUNTER — Encounter: Payer: Self-pay | Admitting: Physical Therapy

## 2019-06-03 DIAGNOSIS — M5442 Lumbago with sciatica, left side: Secondary | ICD-10-CM | POA: Diagnosis not present

## 2019-06-03 DIAGNOSIS — R293 Abnormal posture: Secondary | ICD-10-CM | POA: Diagnosis present

## 2019-06-03 NOTE — Therapy (Signed)
[facility] [place], [place], [address] Phone: [phone]   Fax:  [phone]  Physical Therapy Treatment  Patient Details  Name: Nancy Hayes MRN: [id number] Date of Birth: March 05, 1927 Referring Provider (PT): Bing Matter PA-C.   Encounter Date: 06/03/2019  PT End of Session - 06/03/19 1356    Visit Number  11    Number of Visits  12    Date for PT Re-Evaluation  07/19/19    Authorization Type  PROGRESS NOTE AT 10TH VISIT.  KX MODIFIER AFTER 15 VISITS.    PT Start Time  1352    PT Stop Time  1432    PT Time Calculation (min)  40 min    Equipment Utilized During Treatment  Other (comment)   FWW   Activity Tolerance  Patient tolerated treatment well    Behavior During Therapy  WFL for tasks assessed/performed       Past Medical History:  Diagnosis Date  . AC (acromioclavicular) joint bone spurs    neck  . Arthritis   . Atrial fibrillation ([place])   . Dizzy spells   . Dysrhythmia   . Gout   . History of kidney stones   . Hypertension   . Macular degeneration of both eyes    right eye  . Renal disorder    pt denies  . UTI (lower urinary tract infection)   . Vertigo   . Vision abnormalities     Past Surgical History:  Procedure Laterality Date  . BALLOON DILATION N/A 12/05/2017   Procedure: BALLOON DILATION;  Surgeon: Carol Ada, MD;  Location: [facility];  Service: Endoscopy;  Laterality: N/A;  . CATARACT EXTRACTION    . ESOPHAGOGASTRODUODENOSCOPY (EGD) WITH PROPOFOL N/A 12/05/2017   Procedure: ESOPHAGOGASTRODUODENOSCOPY (EGD) WITH PROPOFOL;  Surgeon: Carol Ada, MD;  Location: [facility];  Service: Endoscopy;  Laterality: N/A;  . EXTRACORPOREAL SHOCK WAVE LITHOTRIPSY Right 05/26/2017   Procedure: EXTRACORPOREAL SHOCK WAVE LITHOTRIPSY (ESWL);  Surgeon: Nickie Retort, MD;  Location: [facility];  Service: Urology;  Laterality: Right;  . PARTIAL HYSTERECTOMY     pt states total hysterectomy     There were no vitals filed for this visit.  Subjective Assessment - 06/03/19 1356    Subjective  COVID 19 screening performed on patient upon arrival. Patient reports feeling good with no pain    Pertinent History  Patient will benefit from skilled physical therapy intervention to address deficits.    Diagnostic tests  X-ray.    Patient Stated Goals  Get out of pain.    Currently in Pain?  No/denies         [facility] PT Assessment - 06/03/19 0001      Assessment   Medical Diagnosis  Left sided low back pain.    Referring Provider (PT)  Bing Matter PA-C.      Restrictions   Weight Bearing Restrictions  No                   [facility] Adult PT Treatment/Exercise - 06/03/19 0001      Lumbar Exercises: Aerobic   Nustep  L5x15 min      Lumbar Exercises: Seated   Long Arc Quad on Chair  Strengthening;Both;2 sets;10 reps;Weights    LAQ on Chair Weights (lbs)  4    LAQ on Chair Limitations  on dynadisc    Sit to Stand  20 reps    Other Seated Lumbar Exercises  B clam green theraband  x20 reps    Other Seated Lumbar Exercises  Row green theraband x20 reps; B reachouts x20reps, B D2 x15 reps each      Knee/Hip Exercises: Standing   Hip Flexion  AROM;Both;2 sets;10 reps;Knee bent    Hip Abduction  AROM;Both;2 sets;10 reps;Knee straight               PT Short Term Goals - 04/22/19 1418      PT SHORT TERM GOAL #1   Title  STG's=LTG's.        PT Long Term Goals - 06/01/19 1347      PT LONG TERM GOAL #1   Title  Independent with a HEP.    Time  6    Period  Weeks    Status  Achieved      PT LONG TERM GOAL #2   Title  Perform ADL's with pain not > 2-3/10.    Period  Weeks    Status  Achieved      PT LONG TERM GOAL #3   Title  Eliminate left LE pain.    Period  Weeks    Status  Achieved            Plan - 06/03/19 1435    Clinical Impression Statement  Patient presented in clinic with no complaints of LBP. Patient has done exceptionally well  since beginning PT. Patient able to do more dynamic sittng with dynadisc for lumbar stability and core activation. No complaints of any LBP during therex but did report dizziness with D2 core exercise. Patient continues to complete home health HEP at [facility].    Personal Factors and Comorbidities  Comorbidity 1    Comorbidities  Patient will benefit from skilled physical therapy intervention to address deficits.    Examination-Activity Limitations  Locomotion Level;Other;Transfers    Examination-Participation Restrictions  Other    Stability/Clinical Decision Making  Evolving/Moderate complexity    Rehab Potential  Good    PT Frequency  2x / week    PT Duration  6 weeks    PT Treatment/Interventions  ADLs/Self Care Home Management;Cryotherapy;Electrical Stimulation;Ultrasound;Moist Heat;Therapeutic activities;Therapeutic exercise;Manual techniques;Patient/family education;Passive range of motion;Dry needling    PT Next Visit Plan  D/C next treatment.    Consulted and Agree with Plan of Care  Patient       Patient will benefit from skilled therapeutic intervention in order to improve the following deficits and impairments:  Abnormal gait, Decreased activity tolerance, Pain, Postural dysfunction  Visit Diagnosis: Acute left-sided low back pain with left-sided sciatica  Abnormal posture     Problem List Patient Active Problem List   Diagnosis Date Noted  . Paroxysmal atrial fibrillation (HCC)   . Tachycardia-bradycardia syndrome ([place])   . Hypokalemia 04/08/2017  . Sepsis ([place]) 04/06/2017  . Essential hypertension 11/23/2015  . PACs (premature atrial contraction)  with trigeminy 06/29/2015    Standley Brooking, PTA 06/03/2019, 3:00 PM  [facility] [place], [place], [address] Phone: [phone]   Fax:  [phone]  Name: Nancy Hayes MRN: [id number] Date of Birth: February 19, 1927

## 2019-06-08 ENCOUNTER — Other Ambulatory Visit: Payer: Self-pay

## 2019-06-08 ENCOUNTER — Encounter: Payer: Self-pay | Admitting: Physical Therapy

## 2019-06-08 ENCOUNTER — Ambulatory Visit: Payer: Medicare Other | Admitting: Physical Therapy

## 2019-06-08 DIAGNOSIS — M5442 Lumbago with sciatica, left side: Secondary | ICD-10-CM

## 2019-06-08 DIAGNOSIS — R293 Abnormal posture: Secondary | ICD-10-CM

## 2019-06-08 NOTE — Therapy (Signed)
[facility] [place], [place], [address] Phone: [phone]   Fax:  [phone]  Physical Therapy Treatment PHYSICAL THERAPY DISCHARGE SUMMARY  Visits from Start of Care: 12  Current functional level related to goals / functional outcomes: See below   Remaining deficits: See goals   Education / Equipment: HEP green theraband Plan: Patient agrees to discharge.  Patient goals were met. Patient is being discharged due to meeting the stated rehab goals.  ?????   Gabriela Eves, PT, DPT   Patient Details  Name: Nancy Hayes MRN: [id number] Date of Birth: Oct 26, 1926 Referring Provider (PT): Bing Matter PA-C.   Encounter Date: 06/08/2019  PT End of Session - 06/08/19 1350    Visit Number  12    Number of Visits  12    Date for PT Re-Evaluation  07/19/19    Authorization Type  PROGRESS NOTE AT 10TH VISIT.  KX MODIFIER AFTER 15 VISITS.    PT Start Time  [place] During Treatment  Other (comment)   rolling walker   Activity Tolerance  Patient tolerated treatment well    Behavior During Therapy  WFL for tasks assessed/performed       Past Medical History:  Diagnosis Date  . AC (acromioclavicular) joint bone spurs    neck  . Arthritis   . Atrial fibrillation ([place])   . Dizzy spells   . Dysrhythmia   . Gout   . History of kidney stones   . Hypertension   . Macular degeneration of both eyes    right eye  . Renal disorder    pt denies  . UTI (lower urinary tract infection)   . Vertigo   . Vision abnormalities     Past Surgical History:  Procedure Laterality Date  . BALLOON DILATION N/A 12/05/2017   Procedure: BALLOON DILATION;  Surgeon: Carol Ada, MD;  Location: [facility];  Service: Endoscopy;  Laterality: N/A;  . CATARACT EXTRACTION    . ESOPHAGOGASTRODUODENOSCOPY (EGD) WITH PROPOFOL N/A 12/05/2017   Procedure: ESOPHAGOGASTRODUODENOSCOPY (EGD) WITH PROPOFOL;  Surgeon: Carol Ada, MD;  Location: [facility];  Service: Endoscopy;  Laterality: N/A;  . EXTRACORPOREAL SHOCK WAVE LITHOTRIPSY Right 05/26/2017   Procedure: EXTRACORPOREAL SHOCK WAVE LITHOTRIPSY (ESWL);  Surgeon: Nickie Retort, MD;  Location: [facility];  Service: Urology;  Laterality: Right;  . PARTIAL HYSTERECTOMY     pt states total hysterectomy    There were no vitals filed for this visit.      [facility] PT Assessment - 06/08/19 0001      Assessment   Medical Diagnosis  Left sided low back pain.    Referring Provider (PT)  Bing Matter PA-C.      Restrictions   Weight Bearing Restrictions  No      Home Environment   Living Environment  --                   [facility] PT Treatment/Exercise - 06/08/19 0001      Exercises   Exercises  Lumbar;Knee/Hip      Lumbar Exercises: Aerobic   Nustep  L5x15 min      Lumbar Exercises: Standing   Other Standing Lumbar Exercises  Marching x20 reps each      Lumbar Exercises: Seated   Long Arc Quad on Chair  Strengthening;Both;3 sets;10 reps;Weights    LAQ on Chair Weights (lbs)  4    Sit to Stand  20 reps  Other Seated Lumbar Exercises  B clam green theraband x30 reps    Other Seated Lumbar Exercises  Row green theraband x20 reps; B reachouts x20reps, B D2 x15 reps each      Knee/Hip Exercises: Standing   Heel Raises  Both;2 sets;10 reps    Hip Abduction  AROM;Both;2 sets;10 reps;Knee straight    Rocker Board  2 minutes      Knee/Hip Exercises: Seated   Ball Squeeze  x2 mins    Hamstring Curl  Strengthening;Both;2 sets;10 reps;Limitations    Hamstring Limitations  green theraband               PT Short Term Goals - 04/22/19 1418      PT SHORT TERM GOAL #1   Title  STG's=LTG's.        PT Long Term Goals - 06/01/19 1347      PT LONG TERM GOAL #1   Title  Independent with a HEP.    Time  6    Period  Weeks    Status  Achieved      PT LONG TERM GOAL #2   Title  Perform ADL's with pain not > 2-3/10.     Period  Weeks    Status  Achieved      PT LONG TERM GOAL #3   Title  Eliminate left LE pain.    Period  Weeks    Status  Achieved            Plan - 06/08/19 1732    Clinical Impression Statement  Patient was able to complete exercises with no reports of increased pain in low back. Patient reports being compliant with HEP provided from previous PT and reports compliance with new HEP provided. Patient is discharged with all goals met.    Personal Factors and Comorbidities  Comorbidity 1    Examination-Activity Limitations  Locomotion Level;Other;Transfers    Examination-Participation Restrictions  Other    Stability/Clinical Decision Making  Evolving/Moderate complexity    Clinical Decision Making  Low    Rehab Potential  Good    PT Frequency  2x / week    PT Duration  6 weeks    PT Treatment/Interventions  ADLs/Self Care Home Management;Cryotherapy;Electrical Stimulation;Ultrasound;Moist Heat;Therapeutic activities;Therapeutic exercise;Manual techniques;Patient/family education;Passive range of motion;Dry needling    PT Next Visit Plan  DC    Consulted and Agree with Plan of Care  Patient       Patient will benefit from skilled therapeutic intervention in order to improve the following deficits and impairments:  Abnormal gait, Decreased activity tolerance, Pain, Postural dysfunction  Visit Diagnosis: Acute left-sided low back pain with left-sided sciatica  Abnormal posture     Problem List Patient Active Problem List   Diagnosis Date Noted  . Paroxysmal atrial fibrillation (H[facility])   . Tachycardia-bradycardia syndrome ([place])   . Hypokalemia 04/08/2017  . Sepsis ([place]) 04/06/2017  . Essential hypertension 11/23/2015  . PACs (premature atrial contraction)  with trigeminy 06/29/2015    Gabriela Eves 06/08/2019, 5:34 PM  [facility] [place], [place], [address] Phone: [phone]   Fax:   [phone]  Name: Nancy Hayes MRN: [id number] Date of Birth: 19-Jun-1927

## 2019-06-14 ENCOUNTER — Telehealth: Payer: Self-pay | Admitting: Cardiovascular Disease

## 2019-06-14 NOTE — Telephone Encounter (Signed)
°*  STAT* If patient is at the pharmacy, call can be transferred to refill team.   1. Which medications need to be refilled? (please list name of each medication and dose if known)  Xarelto  2. Which pharmacy/location (including street and city if local pharmacy) is medication to be sent to? [facility]   3. Do they need a 30 day or 90 day supply? 90 days and refills

## 2019-06-15 MED ORDER — RIVAROXABAN 20 MG PO TABS: ORAL_TABLET | ORAL | 0 refills | Status: DC

## 2019-08-03 ENCOUNTER — Other Ambulatory Visit: Payer: Self-pay | Admitting: Cardiovascular Disease

## 2019-08-26 ENCOUNTER — Telehealth: Payer: Self-pay | Admitting: *Deleted

## 2019-08-26 NOTE — Telephone Encounter (Signed)
Note received from [facility] requesting the patient hold Xarelto for 3-4 days.   Per Dr. Sallyanne Kuster, the patient may hold the Xarelto for 3-4 days and should also keep the ankle elevated. The son has been called and made aware. He has been advised to call back if anything further is needed.

## 2019-12-28 ENCOUNTER — Other Ambulatory Visit: Payer: Self-pay | Admitting: Cardiovascular Disease

## 2020-06-28 ENCOUNTER — Emergency Department (HOSPITAL_COMMUNITY)
Admission: EM | Admit: 2020-06-28 | Discharge: 2020-06-28 | Disposition: A | Discharge status: Home / Self Care | Payer: Medicare Other | Attending: Emergency Medicine | Admitting: Emergency Medicine

## 2020-06-28 ENCOUNTER — Emergency Department (HOSPITAL_COMMUNITY): Payer: Medicare Other

## 2020-06-28 ENCOUNTER — Encounter (HOSPITAL_COMMUNITY): Payer: Self-pay

## 2020-06-28 DIAGNOSIS — I10 Essential (primary) hypertension: Secondary | ICD-10-CM | POA: Diagnosis not present

## 2020-06-28 DIAGNOSIS — Z79899 Other long term (current) drug therapy: Secondary | ICD-10-CM | POA: Insufficient documentation

## 2020-06-28 DIAGNOSIS — S0003XA Contusion of scalp, initial encounter: Secondary | ICD-10-CM | POA: Insufficient documentation

## 2020-06-28 DIAGNOSIS — W19XXXA Unspecified fall, initial encounter: Secondary | ICD-10-CM

## 2020-06-28 DIAGNOSIS — W010XXA Fall on same level from slipping, tripping and stumbling without subsequent striking against object, initial encounter: Secondary | ICD-10-CM | POA: Diagnosis not present

## 2020-06-28 DIAGNOSIS — Z7901 Long term (current) use of anticoagulants: Secondary | ICD-10-CM | POA: Insufficient documentation

## 2020-06-28 DIAGNOSIS — I480 Paroxysmal atrial fibrillation: Secondary | ICD-10-CM | POA: Insufficient documentation

## 2020-06-28 DIAGNOSIS — S0990XA Unspecified injury of head, initial encounter: Secondary | ICD-10-CM | POA: Diagnosis present

## 2020-06-28 MED ORDER — ACETAMINOPHEN 500 MG PO TABS
500.0000 mg | ORAL_TABLET | Freq: Once | ORAL | Status: AC
  Administered 2020-06-28: 500 mg via ORAL
  Filled 2020-06-28: qty 1

## 2020-06-28 MED ORDER — LIDOCAINE-EPINEPHRINE-TETRACAINE (LET) TOPICAL GEL
3.0000 mL | Freq: Once | TOPICAL | Status: AC
  Administered 2020-06-28: 3 mL via TOPICAL
  Filled 2020-06-28: qty 3

## 2020-06-28 NOTE — ED Notes (Signed)
Patient transported to CT 

## 2020-06-28 NOTE — ED Triage Notes (Signed)
Patient slipped and fell getting off her bsc, denies loc, small lac to the back of the head, on xarelto.  Patient has a history of afib.

## 2020-06-29 NOTE — ED Provider Notes (Signed)
[facility] DEPT Provider Note   CSN: [id number] Arrival date & time: 06/28/20  0410     History Chief Complaint  Patient presents with  . Fall    Patient slipped and fell getting off her bsc, denies loc, small lac to the back of the head, on xarelto.    Nancy Hayes is a 86 y.o. female.   Fall This is a new problem. The current episode started 1 to 2 hours ago. The problem occurs constantly. The problem has not changed since onset.Associated symptoms include chest pain (right) and headaches. Pertinent negatives include no abdominal pain. Nothing aggravates the symptoms. Nothing relieves the symptoms. She has tried nothing for the symptoms.       Past Medical History:  Diagnosis Date  . AC (acromioclavicular) joint bone spurs    neck  . Arthritis   . Atrial fibrillation ([place])   . Dizzy spells   . Dysrhythmia   . Gout   . History of kidney stones   . Hypertension   . Macular degeneration of both eyes    right eye  . Renal disorder    pt denies  . UTI (lower urinary tract infection)   . Vertigo   . Vision abnormalities     Patient Active Problem List   Diagnosis Date Noted  . Paroxysmal atrial fibrillation (H[facility])   . Tachycardia-bradycardia syndrome ([place])   . Hypokalemia 04/08/2017  . Sepsis ([place]) 04/06/2017  . Essential hypertension 11/23/2015  . PACs (premature atrial contraction)  with trigeminy 06/29/2015    Past Surgical History:  Procedure Laterality Date  . BALLOON DILATION N/A 12/05/2017   Procedure: BALLOON DILATION;  Surgeon: Carol Ada, MD;  Location: [facility];  Service: Endoscopy;  Laterality: N/A;  . CATARACT EXTRACTION    . ESOPHAGOGASTRODUODENOSCOPY (EGD) WITH PROPOFOL N/A 12/05/2017   Procedure: ESOPHAGOGASTRODUODENOSCOPY (EGD) WITH PROPOFOL;  Surgeon: Carol Ada, MD;  Location: [facility];  Service: Endoscopy;  Laterality: N/A;  . EXTRACORPOREAL SHOCK WAVE LITHOTRIPSY Right 05/26/2017   Procedure:  EXTRACORPOREAL SHOCK WAVE LITHOTRIPSY (ESWL);  Surgeon: Nickie Retort, MD;  Location: [facility];  Service: Urology;  Laterality: Right;  . PARTIAL HYSTERECTOMY     pt states total hysterectomy     OB History   No obstetric history on file.     Family History  Problem Relation Age of Onset  . Hypertension Mother   . Stroke Sister     Social History   Tobacco Use  . Smoking status: Never Smoker  . Smokeless tobacco: Never Used  Vaping Use  . Vaping Use: Never used  Substance Use Topics  . Alcohol use: No  . Drug use: No    Home Medications Prior to Admission medications   Medication Sig Start Date End Date Taking? Authorizing Provider  amLODipine (NORVASC) 10 MG tablet TAKE 1 TABLET BY MOUTH  DAILY 12/31/19   Croitoru, Mihai, MD  Calcium Carbonate-Vitamin D (CALCIUM-D PO) Take 1 tablet by mouth daily.     [provider]  Cranberry 500 MG CAPS Take 500 mg by mouth 2 (two) times daily.     [provider]  docusate sodium (COLACE) 250 MG capsule Take 250 mg by mouth daily.    [provider]  famotidine (PEPCID) 20 MG tablet Take 20 mg by mouth daily.    [provider]  hydrochlorothiazide (MICROZIDE) 12.5 MG capsule TAKE 1 CAPSULE BY MOUTH 4  TIMES A WEEK ON TUESDAYS,  THURSDAYS, SATURDAYS, AND  SUNDAYS 12/31/19   Croitoru, Mihai, MD  LACTOBACILLUS BIFIDUS PO Take 1 capsule by mouth at bedtime.     [provider]  methenamine (HIPREX) 1 g tablet Take 1 g by mouth 2 (two) times daily. 09/03/18   [provider]  metoprolol succinate (TOPROL-XL) 50 MG 24 hr tablet TAKE 1 TABLET BY MOUTH  DAILY 12/31/19   Croitoru, Mihai, MD  Multiple Vitamins-Minerals (PRESERVISION AREDS 2) CAPS Take 2 capsules by mouth daily.    [provider]  Omega-3 Fatty Acids (FISH OIL) 1000 MG CAPS Take 1,000 mg by mouth daily.    [provider]  omeprazole (PRILOSEC) 40 MG capsule Take 40 mg by mouth daily. 12/06/17   [provider]  polyvinyl alcohol (ARTIFICIAL TEARS) 1.4 % ophthalmic solution Place 1 drop into both eyes 2 (two) times daily.    [provider]  vitamin C (ASCORBIC ACID) 500 MG tablet Take 500 mg by mouth 2 (two) times daily.    [provider]  XARELTO 20 MG TABS tablet TAKE 1 TABLET BY MOUTH  DAILY WITH SUPPER 08/03/19   Croitoru, Dani Gobble, MD    Allergies    Patient has no known allergies.  Review of Systems   Review of Systems  Cardiovascular: Positive for chest pain (right).  Gastrointestinal: Negative for abdominal pain.  Neurological: Positive for headaches.  All other systems reviewed and are negative.   Physical Exam Updated Vital Signs BP (!) 157/71   Pulse 85   Temp 98.4 F (36.9 C) (Oral)   Resp 18   SpO2 95%   Physical Exam Vitals and nursing note reviewed.  Constitutional:      Appearance: She is well-developed.  HENT:     Head: Normocephalic.     Comments: Small hemostatic skin tear to right posterior scalp     Nose: Nose normal. No congestion or rhinorrhea.     Mouth/Throat:     Mouth: Mucous membranes are moist.  Cardiovascular:     Rate and Rhythm: Normal rate and regular rhythm.  Pulmonary:     Effort: No respiratory distress.     Breath sounds: No stridor.  Abdominal:     General: There is no distension.  Musculoskeletal:        General: Tenderness (right shoulder, right ribs andn right pelvis) present. Normal range of motion.     Cervical back: Normal range of motion.  Skin:    General: Skin is warm and dry.  Neurological:     General: No focal deficit present.     Mental Status: She is alert.     ED Results / Procedures / Treatments   Labs (all labs ordered are listed, but only abnormal results are displayed) Labs Reviewed - No data to display  EKG None  Radiology DG Chest 2 View  Result Date: 06/28/2020 CLINICAL DATA:  Fall at home EXAM: CHEST - 2 VIEW COMPARISON:  06/11/2017 FINDINGS: Streaky density at both  lung bases which are at least partially resolves on the lateral view. No edema, effusion, or pneumothorax. Moderate hiatal hernia. Normal heart size. IMPRESSION: 1. Atelectatic type opacity at the bases. 2. Moderate hiatal hernia. Electronically Signed   By: Monte Fantasia M.D.   On: 06/28/2020 06:46   DG Pelvis 1-2 Views  Result Date: 06/28/2020 CLINICAL DATA:  Fall at home.  Low back pain EXAM: PELVIS - 1-2 VIEW COMPARISON:  None. FINDINGS: There is no evidence of pelvic fracture or diastasis. No pelvic bone  lesions are seen. Extensive atherosclerotic calcification. Lumbar levoscoliosis and disc narrowing. IMPRESSION: No visible fracture. Electronically Signed   By: Monte Fantasia M.D.   On: 06/28/2020 06:46   DG Shoulder Right  Result Date: 06/28/2020 CLINICAL DATA:  Fall at home with right shoulder pain EXAM: RIGHT SHOULDER - 2+ VIEW COMPARISON:  None. FINDINGS: Degenerative spurring at the acromioclavicular joint. No acute fracture or dislocation. There may be high positioning of the humeral head as seen with chronic rotator cuff tear. IMPRESSION: Degenerative changes without acute finding. Electronically Signed   By: Monte Fantasia M.D.   On: 06/28/2020 06:45   CT Head Wo Contrast  Result Date: 06/28/2020 CLINICAL DATA:  Fall with minor head trauma. EXAM: CT HEAD WITHOUT CONTRAST TECHNIQUE: Contiguous axial images were obtained from the base of the skull through the vertex without intravenous contrast. COMPARISON:  10/14/2014 FINDINGS: Brain: No evidence of acute infarction, hemorrhage, hydrocephalus, extra-axial collection or mass lesion/mass effect. Cerebral volume loss in keeping with age. Mild chronic small vessel ischemia. 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 Skull: Right-sided scalp swelling.  No calvarial fracture. 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 Sinuses/Orbits: No acute finding. IMPRESSION: 1. No evidence of intracranial injury. 2. Right scalp swelling without calvarial fracture. 3. 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 Electronically Signed   By: Monte Fantasia M.D.   On: 06/28/2020 05:05    Procedures Procedures (including critical care time)  Medications Ordered in ED Medications  lidocaine-EPINEPHrine-tetracaine (LET) topical gel (3 mLs Topical Given 06/28/20 0442)  acetaminophen (TYLENOL) tablet 500 mg (500 mg Oral Given 06/28/20 0786)    ED Course  I have reviewed the triage vital signs and the nursing notes.  Pertinent labs & imaging results that were available during my care of the patient were reviewed by me and considered in my medical decision making (see chart for details).    MDM Rules/Calculators/A&P                          No obvious bony injuries. Wound not needing repaired. Imaging head negative. Dc with son.  Final Clinical Impression(s) / ED Diagnoses Final diagnoses:  Fall, initial encounter    Rx / DC Orders ED Discharge Orders    None       Lieutenant Abarca, Corene Cornea, MD 06/29/20 [phone]

## 2020-07-07 ENCOUNTER — Other Ambulatory Visit: Payer: Self-pay | Admitting: Cardiovascular Disease

## 2020-07-07 NOTE — Telephone Encounter (Signed)
Prescription refill request for Xarelto received.  Indication: Atrial Fibrillation Last office visit: needs OV Weight: 65.8 kg Age: 86 Scr: 0.69 03/2020 CrCl: 55.16 ml/min  Prescription refilled

## 2020-07-19 ENCOUNTER — Encounter (HOSPITAL_COMMUNITY): Payer: Self-pay

## 2020-07-19 ENCOUNTER — Emergency Department (HOSPITAL_COMMUNITY): Payer: Medicare Other

## 2020-07-19 ENCOUNTER — Other Ambulatory Visit: Payer: Self-pay

## 2020-07-19 ENCOUNTER — Emergency Department (HOSPITAL_COMMUNITY)
Admission: EM | Admit: 2020-07-19 | Discharge: 2020-07-19 | Disposition: A | Discharge status: Home / Self Care | Payer: Medicare Other | Attending: Emergency Medicine | Admitting: Emergency Medicine

## 2020-07-19 DIAGNOSIS — Z7901 Long term (current) use of anticoagulants: Secondary | ICD-10-CM | POA: Insufficient documentation

## 2020-07-19 DIAGNOSIS — R0602 Shortness of breath: Secondary | ICD-10-CM | POA: Insufficient documentation

## 2020-07-19 DIAGNOSIS — Z79899 Other long term (current) drug therapy: Secondary | ICD-10-CM | POA: Diagnosis not present

## 2020-07-19 DIAGNOSIS — I10 Essential (primary) hypertension: Secondary | ICD-10-CM | POA: Diagnosis not present

## 2020-07-19 DIAGNOSIS — D509 Iron deficiency anemia, unspecified: Secondary | ICD-10-CM | POA: Insufficient documentation

## 2020-07-19 DIAGNOSIS — R531 Weakness: Secondary | ICD-10-CM | POA: Diagnosis not present

## 2020-07-19 DIAGNOSIS — N3001 Acute cystitis with hematuria: Secondary | ICD-10-CM | POA: Insufficient documentation

## 2020-07-19 LAB — URINALYSIS, ROUTINE W REFLEX MICROSCOPIC
Bilirubin Urine: NEGATIVE
Glucose, UA: NEGATIVE mg/dL
Hgb urine dipstick: NEGATIVE
Ketones, ur: NEGATIVE mg/dL
Nitrite: NEGATIVE
Protein, ur: 30 mg/dL — AB
Specific Gravity, Urine: 1.012 (ref 1.005–1.030)
WBC, UA: >50 WBC/hpf — ABNORMAL HIGH (ref 0–5)
pH: 6 (ref 5.0–8.0)

## 2020-07-19 LAB — BASIC METABOLIC PANEL
Anion gap: 9 (ref 5–15)
BUN: 10 mg/dL (ref 8–23)
CO2: 28 mmol/L (ref 22–32)
Calcium: 9.3 mg/dL (ref 8.9–10.3)
Chloride: 100 mmol/L (ref 98–111)
Creatinine, Ser: 0.57 mg/dL (ref 0.44–1.00)
GFR, Estimated: >60 mL/min (ref >60)
Glucose, Bld: 119 mg/dL — ABNORMAL HIGH (ref 70–99)
Potassium: 3.6 mmol/L (ref 3.5–5.1)
Sodium: 137 mmol/L (ref 135–145)

## 2020-07-19 LAB — POC OCCULT BLOOD, ED: Fecal Occult Bld: NEGATIVE

## 2020-07-19 LAB — CBC
HCT: 35.2 % — ABNORMAL LOW (ref 36.0–46.0)
Hemoglobin: 10.6 g/dL — ABNORMAL LOW (ref 12.0–15.0)
MCH: 25.7 pg — ABNORMAL LOW (ref 26.0–34.0)
MCHC: 30.1 g/dL (ref 30.0–36.0)
MCV: 85.2 fL (ref 80.0–100.0)
Platelets: 423 10*3/uL — ABNORMAL HIGH (ref 150–400)
RBC: 4.13 MIL/uL (ref 3.87–5.11)
RDW: 17.9 % — ABNORMAL HIGH (ref 11.5–15.5)
WBC: 8.2 10*3/uL (ref 4.0–10.5)
nRBC: 0 % (ref 0.0–0.2)

## 2020-07-19 LAB — CBG MONITORING, ED: Glucose-Capillary: 106 mg/dL — ABNORMAL HIGH (ref 70–99)

## 2020-07-19 MED ORDER — LEVOFLOXACIN 250 MG PO TABS: 250.0000 mg | ORAL_TABLET | Freq: Every day | ORAL | 0 refills | Status: AC

## 2020-07-19 MED ORDER — FERROUS SULFATE 325 (65 FE) MG PO TABS: 325.0000 mg | ORAL_TABLET | Freq: Every day | ORAL | 0 refills | Status: DC

## 2020-07-19 MED ORDER — LEVOFLOXACIN IN D5W 500 MG/100ML IV SOLN
500.0000 mg | Freq: Once | INTRAVENOUS | Status: AC
  Administered 2020-07-19: 500 mg via INTRAVENOUS
  Filled 2020-07-19: qty 100

## 2020-07-19 MED ORDER — FLUCONAZOLE 100 MG PO TABS: 100.0000 mg | ORAL_TABLET | Freq: Once | ORAL | Status: DC

## 2020-07-19 MED ORDER — FLUCONAZOLE 150 MG PO TABS
150.0000 mg | ORAL_TABLET | Freq: Once | ORAL | Status: AC
  Administered 2020-07-19: 150 mg via ORAL
  Filled 2020-07-19: qty 1

## 2020-07-19 MED ORDER — SODIUM CHLORIDE 0.9 % IV SOLN
INTRAVENOUS | Status: DC | PRN
  Administered 2020-07-19: 250 mL via INTRAVENOUS

## 2020-07-19 NOTE — ED Provider Notes (Signed)
[facility] EMERGENCY DEPARTMENT Provider Note   CSN: [id number] Arrival date & time: 07/19/20  9485     History Chief Complaint  Patient presents with  . Weakness    Nancy Hayes is a 86 y.o. female with a history occluding paroxysmal atrial fibrillation on Xarelto, hypertension, history of frequent UTIs presenting with a 2-week history of increasing generalized fatigue and weakness.  She was diagnosed with a UTI several weeks ago and placed on a 7-day course of Keflex.  Her symptoms did not improve, therefore was switched to nitrofurantoin which she completed yesterday.  She denies ever having a history of dysuria, urinary frequency, back pain, fevers or chills with prior episodes of UTIs, her symptoms have always been weakness.  She describes generalized fatigue but also weakness in her legs with ambulation, reporting simply walking to her bathroom increases her leg weakness.  Also endorses that such exertion also causes shortness of breath.  She denies chest pain, has had no cough, palpitations, denies peripheral edema or orthopnea.  She has had no fevers or chills.  She does describe severe sickness with urosepsis requiring hospitalization several years ago and is concerned about this returning.  She is fully Covid vaccinated including her booster, denies any exposures to Covid positive individuals.  HPI     Past Medical History:  Diagnosis Date  . AC (acromioclavicular) joint bone spurs    neck  . Arthritis   . Atrial fibrillation ([place])   . Dizzy spells   . Dysrhythmia   . Gout   . History of kidney stones   . Hypertension   . Macular degeneration of both eyes    right eye  . Renal disorder    pt denies  . UTI (lower urinary tract infection)   . Vertigo   . Vision abnormalities     Patient Active Problem List   Diagnosis Date Noted  . Generalized weakness   . Paroxysmal atrial fibrillation (HCC)   . Tachycardia-bradycardia syndrome ([place])   . Hypokalemia  04/08/2017  . Sepsis ([place]) 04/06/2017  . Essential hypertension 11/23/2015  . PACs (premature atrial contraction)  with trigeminy 06/29/2015    Past Surgical History:  Procedure Laterality Date  . BALLOON DILATION N/A 12/05/2017   Procedure: BALLOON DILATION;  Surgeon: Carol Ada, MD;  Location: [facility];  Service: Endoscopy;  Laterality: N/A;  . CATARACT EXTRACTION    . ESOPHAGOGASTRODUODENOSCOPY (EGD) WITH PROPOFOL N/A 12/05/2017   Procedure: ESOPHAGOGASTRODUODENOSCOPY (EGD) WITH PROPOFOL;  Surgeon: Carol Ada, MD;  Location: [facility];  Service: Endoscopy;  Laterality: N/A;  . EXTRACORPOREAL SHOCK WAVE LITHOTRIPSY Right 05/26/2017   Procedure: EXTRACORPOREAL SHOCK WAVE LITHOTRIPSY (ESWL);  Surgeon: Nickie Retort, MD;  Location: [facility];  Service: Urology;  Laterality: Right;  . PARTIAL HYSTERECTOMY     pt states total hysterectomy     OB History   No obstetric history on file.     Family History  Problem Relation Age of Onset  . Hypertension Mother   . Stroke Sister     Social History   Tobacco Use  . Smoking status: Never Smoker  . Smokeless tobacco: Never Used  Vaping Use  . Vaping Use: Never used  Substance Use Topics  . Alcohol use: No  . Drug use: No    Home Medications Prior to Admission medications   Medication Sig Start Date End Date Taking? Authorizing Provider  amLODipine (NORVASC) 10 MG tablet TAKE 1 TABLET BY MOUTH  DAILY 12/31/19  Yes  Croitoru, Mihai, MD  Calcium Carbonate-Vitamin D (CALCIUM-D PO) Take 1 tablet by mouth daily.    Yes [provider]  Cranberry 500 MG CAPS Take 500 mg by mouth 2 (two) times daily.    Yes [provider]  docusate sodium (COLACE) 250 MG capsule Take 250 mg by mouth daily.   Yes [provider]  hydrochlorothiazide (MICROZIDE) 12.5 MG capsule TAKE 1 CAPSULE BY MOUTH 4  TIMES A WEEK ON TUESDAYS,  THURSDAYS, SATURDAYS, AND  SUNDAYS Patient taking differently: Take 12.5 mg by mouth See  admin instructions. Take 1 capsule by mouth 4 times a week on Tuesdays, Thursdays, Saturdays and Sundays 12/31/19  Yes Croitoru, Mihai, MD  LACTOBACILLUS BIFIDUS PO Take 1 capsule by mouth at bedtime.    Yes [provider]  methenamine (HIPREX) 1 g tablet Take 1 g by mouth 2 (two) times daily. 09/03/18  Yes [provider]  metoprolol succinate (TOPROL-XL) 50 MG 24 hr tablet TAKE 1 TABLET BY MOUTH  DAILY 12/31/19  Yes Croitoru, Mihai, MD  Omega-3 Fatty Acids (FISH OIL) 1000 MG CAPS Take 1,000 mg by mouth daily.   Yes [provider]  omeprazole (PRILOSEC) 20 MG capsule Take 20 mg by mouth daily. 06/19/20  Yes [provider]  polyvinyl alcohol (ARTIFICIAL TEARS) 1.4 % ophthalmic solution Place 1 drop into both eyes 2 (two) times daily.   Yes [provider]  vitamin C (ASCORBIC ACID) 500 MG tablet Take 500 mg by mouth 2 (two) times daily.   Yes [provider]  XARELTO 20 MG TABS tablet TAKE 1 TABLET BY MOUTH  DAILY WITH SUPPER Patient taking differently: Take 20 mg by mouth daily with supper.  07/07/20  Yes Croitoru, Mihai, MD  cephALEXin (KEFLEX) 500 MG capsule Take 1,000 mg by mouth 2 (two) times daily. Patient not taking: Reported on 07/19/2020 06/30/20   [provider]  ferrous sulfate 325 (65 FE) MG tablet Take 1 tablet (325 mg total) by mouth daily. 07/19/20   Evalee Jefferson, PA-C  levofloxacin (LEVAQUIN) 250 MG tablet Take 1 tablet (250 mg total) by mouth daily for 3 days. 07/19/20 07/22/20  Evalee Jefferson, PA-C  nitrofurantoin, macrocrystal-monohydrate, (MACROBID) 100 MG capsule Take 100 mg by mouth 2 (two) times daily. Patient not taking: Reported on 07/19/2020 07/11/20   [provider]    Allergies    Patient has no known allergies.  Review of Systems   Review of Systems  Constitutional: Positive for fatigue. Negative for fever.  HENT: Negative for congestion and sore throat.   Eyes: Negative.   Respiratory: Positive  for shortness of breath. Negative for chest tightness.        Exertional sob  Cardiovascular: Negative for chest pain.  Gastrointestinal: Negative for abdominal pain and nausea.  Genitourinary: Negative.   Musculoskeletal: Negative for arthralgias, joint swelling and neck pain.  Skin: Negative.  Negative for rash and wound.  Neurological: Positive for weakness. Negative for dizziness, light-headedness, numbness and headaches.  Psychiatric/Behavioral: Negative.     Physical Exam Updated Vital Signs BP (!) 168/71   Pulse 73   Temp 98.5 F (36.9 C) (Oral)   Resp 19   Ht 5\' 4"  (1.626 m)   Wt 68 kg   SpO2 95%   BMI 25.75 kg/m   Physical Exam Vitals and nursing note reviewed.  Constitutional:      Appearance: She is well-developed.  HENT:     Head: Normocephalic and atraumatic.  Eyes:  Conjunctiva/sclera: Conjunctivae normal.  Cardiovascular:     Rate and Rhythm: Normal rate and regular rhythm.     Heart sounds: Normal heart sounds.  Pulmonary:     Effort: Pulmonary effort is normal.     Breath sounds: Normal breath sounds. No wheezing.  Abdominal:     General: Bowel sounds are normal.     Palpations: Abdomen is soft.     Tenderness: There is no abdominal tenderness.  Musculoskeletal:        General: Normal range of motion.     Cervical back: Normal range of motion.  Skin:    General: Skin is warm and dry.  Neurological:     Mental Status: She is alert.     ED Results / Procedures / Treatments   Labs (all labs ordered are listed, but only abnormal results are displayed) Labs Reviewed  BASIC METABOLIC PANEL - Abnormal; Notable for the following components:      Result Value   Glucose, Bld 119 (*)    All other components within normal limits  CBC - Abnormal; Notable for the following components:   Hemoglobin 10.6 (*)    HCT 35.2 (*)    MCH 25.7 (*)    RDW 17.9 (*)    Platelets 423 (*)    All other components within normal limits  URINALYSIS, ROUTINE W  REFLEX MICROSCOPIC - Abnormal; Notable for the following components:   APPearance CLOUDY (*)    Protein, ur 30 (*)    Leukocytes,Ua LARGE (*)    WBC, UA >50 (*)    Bacteria, UA RARE (*)    All other components within normal limits  CBG MONITORING, ED - Abnormal; Notable for the following components:   Glucose-Capillary 106 (*)    All other components within normal limits  URINE CULTURE  RESP PANEL BY RT PCR (RSV, FLU A&B, COVID)  URINALYSIS, ROUTINE W REFLEX MICROSCOPIC  POC OCCULT BLOOD, ED    EKG None  Radiology DG Chest Port 1 View  Result Date: 07/19/2020 CLINICAL DATA:  Weakness.  Recent treatment for UTI. EXAM: PORTABLE CHEST 1 VIEW COMPARISON:  06/28/2020 FINDINGS: Cardiac silhouette is normal in size. No mediastinal or hilar masses. Stable linear opacities at the lung bases consistent with atelectasis or scarring. Lungs otherwise clear. No pleural effusion or pneumothorax. Skeletal structures are demineralized but grossly intact. IMPRESSION: No active disease. Electronically Signed   By: Lajean Manes M.D.   On: 07/19/2020 13:58    Procedures Procedures (including critical care time)  Medications Ordered in ED Medications  0.9 %  sodium chloride infusion (250 mLs Intravenous New Bag/Given 07/19/20 1551)  levofloxacin (LEVAQUIN) IVPB 500 mg (500 mg Intravenous New Bag/Given 07/19/20 1554)  fluconazole (DIFLUCAN) tablet 150 mg (150 mg Oral Given 07/19/20 1550)    ED Course  I have reviewed the triage vital signs and the nursing notes.  Pertinent labs & imaging results that were available during my care of the patient were reviewed by me and considered in my medical decision making (see chart for details).    MDM Rules/Calculators/A&P                          Patient with generalized weakness along with her typical symptoms of UTI.  She has had 2 courses of outpatient antibiotics without improvement in her symptoms.  She will require admission given her age and  failing outpatient antibiotic therapy.  Her Covid screening is pending at  this time.  Her urine is positive for WBCs, bacteria protein and RBCs.  It also contains budding yeast, Diflucan also given.  She was given an IV dose of Levaquin as she has already completed Keflex and nitrofurantoin as an outpatient.  She does have an anemia today with a macrocytic anemia at 10.6, her last hemoglobin January 2021 was normal.  Hemoccult negative.  Discussed with Dr. Denton Brick who will assess pt and determine whether she meets admission criteria.  She was ambulated in the dept, no desaturation but felt weak and "trembly" in her legs - walker and assistant needed for ambulation but was able to walk to the bathroom next to her room. Pt evaluated by Dr. Denton Brick, consult note in chart, felt stable for dc home.    Will complete levaquin dosing, plan f/u with either pcp or urology after abx completion for repeat UA.  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  Return precautions outlined.    Final Clinical Impression(s) / ED Diagnoses Final diagnoses:  Acute cystitis with hematuria  Iron deficiency anemia, unspecified iron deficiency anemia type    Rx / DC Orders ED Discharge Orders         Ordered    levofloxacin (LEVAQUIN) 250 MG tablet  Daily        07/19/20 1733    ferrous sulfate 325 (65 FE) MG tablet  Daily        07/19/20 1734           Evalee Jefferson, PA-C 07/19/20 1735    Milton Ferguson, MD 07/21/20 [phone]

## 2020-07-19 NOTE — Discharge Instructions (Addendum)
Take the antibiotic prescribed, your next dose tomorrow morning.  Also take the iron supplementation as you are anemic as discussed - remember that this will make your stool very dark and can cause constipation - take your stool softener daily.  Plan to see your primary MD or your urologist in one week for  a repeat urinalysis to ensure your infection is resolved.  In the interim, return here for any new or worsening symptoms.

## 2020-07-19 NOTE — ED Notes (Signed)
Pt was informed that we need a urine specimen.  

## 2020-07-19 NOTE — ED Notes (Signed)
Pt ambulated well to the bathroom and back with a walker. Pt states that she was trembling. Pt's O2 sats stayed around 96%.

## 2020-07-19 NOTE — ED Triage Notes (Signed)
Pt presents to ED with complaints of generalized weakness. Pt states she has been on abx for UTI. Pt states the weakness has been going on for about 2 weeks.

## 2020-07-19 NOTE — Consult Note (Signed)
Medical Consultation   Nancy Hayes  EZM:[id number]  DOB: [birth year]  DOA: 07/19/2020  PCP: Aletha Halim., PA-C   Outpatient Specialists: PCP, Urologist  Requesting physician: EDP  Reason for consultation: Weakness   History of Present Illness: Nancy Hayes is an 86 y.o. female paroxysmal A. fib, hypertension, vertigo.  Patient presented to the ED with complaints of generalized weakness over the past 2 weeks. On my evaluation patient is awake alert oriented and able to give me a full history.  Patient's son [name] who patient resides with his at bedside. Patient reports ongoing generalized weakness over the past 2 weeks, she reports some intermittent mild difficulty breathing with exertion, denies present some mornings but not in the evenings. She reports good oral intake, no vomiting, no loose stools.  She does not feel dehydrated.  At baseline patient ambulates with a walker.  She reports 1 fall several months ago otherwise she does not fall. She denies fevers, no chills.  She has baseline urinary frequency ongoing for several years, son agrees this is her baseline.  She denies pain with urination.  No abdominal pain.  No chest pain.  No lower extremity swelling.  She otherwise feels well. Due to complaints of weakness, a month ago, patient was diagnosed with UTI and treated with a course of Keflex, with persistent weakness, patient was treated with a course of nitrofurantoin for UTI.   Patient reports she never had urinary symptoms.  ED course-temperature 98.1, heart rate 70s, blood pressure systolic 503T to 465K, O2 sats greater than 96% on room air.  WBC 8.2.  Sodium 137.  Potassium 3.6.  Creatinine 0.57.  Glucose 119.  Hemoglobin 10.6.  EKG with T wave abnormalities that are unchanged from prior EKG.  UA showing large leukocytes rare bacteria.  Acute abnormality. Patient was ambulated in the ED, she ambulated well to the Bactrim and back, with a walker.   Patient states that she was trembling, but there was no witnessed trembling.  Patient's O2 sats stayed ~96%.  Review of Systems:  ROS As per HPI otherwise full review of system negative.  Past Medical History: Past Medical History:  Diagnosis Date  . AC (acromioclavicular) joint bone spurs    neck  . Arthritis   . Atrial fibrillation ([place])   . Dizzy spells   . Dysrhythmia   . Gout   . History of kidney stones   . Hypertension   . Macular degeneration of both eyes    right eye  . Renal disorder    pt denies  . UTI (lower urinary tract infection)   . Vertigo   . Vision abnormalities     Past Surgical History: Past Surgical History:  Procedure Laterality Date  . BALLOON DILATION N/A 12/05/2017   Procedure: BALLOON DILATION;  Surgeon: Carol Ada, MD;  Location: [facility];  Service: Endoscopy;  Laterality: N/A;  . CATARACT EXTRACTION    . ESOPHAGOGASTRODUODENOSCOPY (EGD) WITH PROPOFOL N/A 12/05/2017   Procedure: ESOPHAGOGASTRODUODENOSCOPY (EGD) WITH PROPOFOL;  Surgeon: Carol Ada, MD;  Location: [facility];  Service: Endoscopy;  Laterality: N/A;  . EXTRACORPOREAL SHOCK WAVE LITHOTRIPSY Right 05/26/2017   Procedure: EXTRACORPOREAL SHOCK WAVE LITHOTRIPSY (ESWL);  Surgeon: Nickie Retort, MD;  Location: [facility];  Service: Urology;  Laterality: Right;  . PARTIAL HYSTERECTOMY     pt states total hysterectomy   Allergies:  No Known Allergies  Social History:  reports that she has never smoked. She has never used smokeless tobacco. She reports that she does not drink alcohol and does not use drugs.   Family History: Family History  Problem Relation Age of Onset  . Hypertension Mother   . Stroke Sister    Physical Exam: Vitals:   07/19/20 1400 07/19/20 1430 07/19/20 1530 07/19/20 1600  BP: (!) 162/64 (!) 175/64 (!) 173/64 (!) 177/67  Pulse: 74 73 76 74  Resp: (!) 21 19 (!) 22 (!) 22  Temp:      TempSrc:      SpO2: 96% 97% 95% 97%  Weight:      Height:          Constitutional: Alert and awake, oriented x3, not in any acute distress. Eyes: PERLA, EOMI, irises appear normal, anicteric sclera,  ENMT: external ears and nose appear normal,             Lips appears normal, oropharynx mucosa, tongue, posterior pharynx appear normal  Neck: neck appears normal, no masses, normal ROM, no thyromegaly, no JVD  CVS: S1-S2 clear,  no LE edema, normal pedal pulses  Respiratory:  clear to auscultation bilaterally, no wheezing, rales or rhonchi. Respiratory effort normal. No accessory muscle use.  Abdomen: soft nontender, nondistended, normal bowel sounds, no hepatosplenomegaly, no hernias  Musculoskeletal: : no cyanosis, clubbing or edema noted bilaterally Neuro: No apparent cranial abnormality, moving extremities spontaneously. Psych: judgement and insight appear normal, stable mood and affect, mental status Skin: no rashes or lesions or ulcers, no induration or nodules   Data reviewed:  I have personally reviewed following labs and imaging studies Labs:  CBC: Recent Labs  Lab 07/19/20 1105  WBC 8.2  HGB 10.6*  HCT 35.2*  MCV 85.2  PLT 423*    Basic Metabolic Panel: Recent Labs  Lab 07/19/20 1105  NA 137  K 3.6  CL 100  CO2 28  GLUCOSE 119*  BUN 10  CREATININE 0.57  CALCIUM 9.3   CBG: Recent Labs  Lab 07/19/20 1007  GLUCAP 106*   Urinalysis    Component Value Date/Time   COLORURINE YELLOW 07/19/2020 1233   APPEARANCEUR CLOUDY (A) 07/19/2020 1233   LABSPEC 1.012 07/19/2020 1233   PHURINE 6.0 07/19/2020 [place] 07/19/2020 [place] 07/19/2020 [place] 07/19/2020 1233   KETONESUR NEGATIVE 07/19/2020 1233   PROTEINUR 30 (A) 07/19/2020 1233   UROBILINOGEN 1.0 10/27/2009 0051   NITRITE NEGATIVE 07/19/2020 1233   LEUKOCYTESUR LARGE (A) 07/19/2020 1233     Microbiology No results found for this or any previous visit (from the past 240 hour(s)).  Inpatient Medications:    Scheduled Meds: Continuous Infusions: . sodium chloride 250 mL (07/19/20 1551)  . levofloxacin (LEVAQUIN) IV 500 mg (07/19/20 1554)     Radiological Exams on Admission: DG Chest Port 1 View  Result Date: 07/19/2020 CLINICAL DATA:  Weakness.  Recent treatment for UTI. EXAM: PORTABLE CHEST 1 VIEW COMPARISON:  06/28/2020 FINDINGS: Cardiac silhouette is normal in size. No mediastinal or hilar masses. Stable linear opacities at the lung bases consistent with atelectasis or scarring. Lungs otherwise clear. No pleural effusion or pneumothorax. Skeletal structures are demineralized but grossly intact. IMPRESSION: No active disease. Electronically Signed   By: Lajean Manes M.D.   On: 07/19/2020 13:58    Impression/Recommendations  Generalized weakness-likely from advanced age and increasing frailty.   On my evaluation patient is well-appearing, able to ambulate in the ED.  She denies falls, and she tells me she does not think she will fall, she ambulates well with a walker at home..  O2 sats remained above 96% with ambulation.  Work-up in the ED unremarkable.  EKG unchanged. -I talked to patient and her son, they are comfortable going home, especially as she has not been falling and she does not think she will fall and she ambulates well with a walker. -At this time patient does not meet criteria for admission.  I have spoken to patient and son about return precautions, they verbalized understanding. -Patient to follow-up with primary care provider and urologist. -Disposition per ED provider.  Asymptomatic bacteriuria-patient is likely colonized.  UA with large leukocytes rare bacteria.  She has no urinary symptoms.  Afebrile without leukocytosis.  She is very well-appearing.  She has completed course of Keflex and nitrofurantoin.  At this time I will hold off on any further antibiotics.  Urine cultures have been ordered in the ED, she should follow-up with a primary care provider with the  results.  Chronic anemia hemoglobin 10.6.  No history to suggest acute GI blood loss at this time.  Hgb 03/2020 was 10.9.  Recent baseline ~ 11. -Follow-up with outpatient provider.  Atrial fibrillation she is currently in sinus rhythm, rate controlled and on anticoagulation with Xarelto.  Thank you for this consultation.  Our [facility] hospitalist team will follow the patient with you.   Bethena Roys M.D. [facility] 07/19/2020, 5:21 PM

## 2020-07-22 LAB — URINE CULTURE: Culture: >=100000 — AB

## 2020-07-23 ENCOUNTER — Telehealth: Payer: Self-pay | Admitting: Emergency Medicine

## 2020-07-23 NOTE — Telephone Encounter (Signed)
Post ED Visit - Positive Culture Follow-up  Culture report reviewed by antimicrobial stewardship pharmacist: [place] Team []  Elenor Quinones, Pharm.D. []  Heide Guile, Pharm.D., BCPS AQ-ID []  Parks Neptune, Pharm.D., BCPS []  Alycia Rossetti, Pharm.D., BCPS []  [place], Pharm.D., BCPS, AAHIVP []  Legrand Como, Pharm.D., BCPS, AAHIVP []  Salome Arnt, PharmD, BCPS []  Johnnette Gourd, PharmD, BCPS []  Hughes Better, PharmD, BCPS [x]  Duanne Limerick, PharmD []  Laqueta Linden, PharmD, BCPS []  Albertina Parr, PharmD  [place] Team []  Leodis Sias, PharmD []  Lindell Spar, PharmD []  Royetta Asal, PharmD []  Graylin Shiver, Rph []  Rema Fendt) Glennon Mac, PharmD []  Arlyn Dunning, PharmD []  Netta Cedars, PharmD []  Dia Sitter, PharmD []  Leone Haven, PharmD []  Gretta Arab, PharmD []  Theodis Shove, PharmD []  Peggyann Juba, PharmD []  Reuel Boom, PharmD   Positive urine culture Treated with Levofloxacin, organism sensitive to the same and no further patient follow-up is required at this time. Margarita Mail PA  Larene [place] C Shamela Haydon 07/23/2020, 4:36 PM

## 2020-08-03 ENCOUNTER — Other Ambulatory Visit: Payer: Self-pay

## 2020-08-03 ENCOUNTER — Encounter: Payer: Self-pay | Admitting: Cardiovascular Disease

## 2020-08-03 ENCOUNTER — Ambulatory Visit (INDEPENDENT_AMBULATORY_CARE_PROVIDER_SITE_OTHER): Payer: Medicare Other | Admitting: Cardiovascular Disease

## 2020-08-03 VITALS — BP 122/70 | HR 69 | Ht 64.0 in | Wt 146.0 lb

## 2020-08-03 DIAGNOSIS — I10 Essential (primary) hypertension: Secondary | ICD-10-CM

## 2020-08-03 DIAGNOSIS — Z7901 Long term (current) use of anticoagulants: Secondary | ICD-10-CM | POA: Diagnosis not present

## 2020-08-03 DIAGNOSIS — I480 Paroxysmal atrial fibrillation: Secondary | ICD-10-CM

## 2020-08-03 MED ORDER — HYDROCHLOROTHIAZIDE 12.5 MG PO CAPS
12.5000 mg | ORAL_CAPSULE | ORAL | 11 refills | Status: DC
Start: 2020-08-03 — End: 2020-12-18

## 2020-08-03 NOTE — Patient Instructions (Signed)
Medication Instructions:  Take the Hydrochlorothiazide 12.5 mg twice weekly, once on Monday and Friday  *If you need a refill on your cardiac medications before your next appointment, please call your pharmacy*   Lab Work: None ordered If you have labs (blood work) drawn today and your tests are completely normal, you will receive your results only by: Marland Kitchen MyChart Message (if you have MyChart) OR . A paper copy in the mail If you have any lab test that is abnormal or we need to change your treatment, we will call you to review the results.   Testing/Procedures: None ordered   Follow-Up: At [facility], you and your health needs are our priority.  As part of our continuing mission to provide you with exceptional heart care, we have created designated Provider Care Teams.  These Care Teams include your primary Cardiologist (physician) and Advanced Practice Providers (APPs -  Physician Assistants and Nurse Practitioners) who all work together to provide you with the care you need, when you need it.  We recommend signing up for the patient portal called "MyChart".  Sign up information is provided on this After Visit Summary.  MyChart is used to connect with patients for Virtual Visits (Telemedicine).  Patients are able to view lab/test results, encounter notes, upcoming appointments, etc.  Non-urgent messages can be sent to your provider as well.   To learn more about what you can do with MyChart, go to [email or web address].    Your next appointment:   12 month(s)  The format for your next appointment:   In Person  Provider:   You may see Sanda Klein, MD or one of the following Advanced Practice Providers on your designated Care Team:    Almyra Deforest, PA-C  Fabian Sharp, PA-C or   Roby Lofts, [place]

## 2020-08-03 NOTE — Progress Notes (Signed)
Cardiology Office Note   Date:  08/05/2020   ID:  Nancy Hayes, DOB Nov 01, 1926, MRN [id number]  PCP:  Aletha Halim., PA-C  Cardiologist:  Sanda Klein, MD  Electrophysiologist:  None   Chief Complaint:  AFib follow up, HTN  History of Present Illness:    Nancy Hayes is a 86 y.o. female with atrial fibrillation and HTN.  She is here with her son Nancy Rondo Jolyn Lent.), whom she lives with ever since her husband Nancy Hayes passed away.  The patient specifically denies any chest pain at rest exertion, dyspnea at rest or with exertion, orthopnea, paroxysmal nocturnal dyspnea, syncope, palpitations, focal neurological deficits, intermittent claudication, lower extremity edema, unexplained weight gain, cough, hemoptysis or wheezing. About one month ago, she had a fall when she got up at 2AM at night (later found to have UTI) and hit her head. CT was OK, had a small skin laceration. Denies falls or bleeding since then.    Past Medical History:  Diagnosis Date  . AC (acromioclavicular) joint bone spurs    neck  . Arthritis   . Atrial fibrillation ([place])   . Dizzy spells   . Dysrhythmia   . Gout   . History of kidney stones   . Hypertension   . Macular degeneration of both eyes    right eye  . Renal disorder    pt denies  . UTI (lower urinary tract infection)   . Vertigo   . Vision abnormalities    Past Surgical History:  Procedure Laterality Date  . BALLOON DILATION N/A 12/05/2017   Procedure: BALLOON DILATION;  Surgeon: Carol Ada, MD;  Location: [facility];  Service: Endoscopy;  Laterality: N/A;  . CATARACT EXTRACTION    . ESOPHAGOGASTRODUODENOSCOPY (EGD) WITH PROPOFOL N/A 12/05/2017   Procedure: ESOPHAGOGASTRODUODENOSCOPY (EGD) WITH PROPOFOL;  Surgeon: Carol Ada, MD;  [facility];  Service: Endoscopy;  Laterality: N/A;  . EXTRACORPOREAL SHOCK WAVE LITHOTRIPSY Right 05/26/2017   Procedure: EXTRACORPOREAL SHOCK WAVE LITHOTRIPSY (ESWL);  Surgeon:  Nickie Retort, MD;  Location: [facility];  Service: Urology;  Laterality: Right;  . PARTIAL HYSTERECTOMY     pt states total hysterectomy     Current Meds  Medication Sig  . amLODipine (NORVASC) 10 MG tablet TAKE 1 TABLET BY MOUTH  DAILY  . Calcium Carbonate-Vitamin D (CALCIUM-D PO) Take 1 tablet by mouth daily.   . cephALEXin (KEFLEX) 500 MG capsule Take 1,000 mg by mouth 2 (two) times daily.   . Cranberry 500 MG CAPS Take 500 mg by mouth 2 (two) times daily.   Marland Kitchen docusate sodium (COLACE) 250 MG capsule Take 250 mg by mouth daily.  . ferrous sulfate 325 (65 FE) MG tablet Take 1 tablet (325 mg total) by mouth daily.  . hydrochlorothiazide (MICROZIDE) 12.5 MG capsule Take 1 capsule (12.5 mg total) by mouth 2 (two) times a week. Take on Monday and Friday  . LACTOBACILLUS BIFIDUS PO Take 1 capsule by mouth at bedtime.   . metoprolol succinate (TOPROL-XL) 50 MG 24 hr tablet TAKE 1 TABLET BY MOUTH  DAILY  . Omega-3 Fatty Acids (FISH OIL) 1000 MG CAPS Take 1,000 mg by mouth daily.  Marland Kitchen omeprazole (PRILOSEC) 20 MG capsule Take 20 mg by mouth daily.  . polyvinyl alcohol (ARTIFICIAL TEARS) 1.4 % ophthalmic solution Place 1 drop into both eyes 2 (two) times daily.  . vitamin C (ASCORBIC ACID) 500 MG tablet Take 500 mg by mouth 2 (two) times daily.  Marland Kitchen  XARELTO 20 MG TABS tablet TAKE 1 TABLET BY MOUTH  DAILY WITH SUPPER (Patient taking differently: Take 20 mg by mouth daily with supper. )  . [DISCONTINUED] hydrochlorothiazide (MICROZIDE) 12.5 MG capsule TAKE 1 CAPSULE BY MOUTH 4  TIMES A WEEK ON TUESDAYS,  THURSDAYS, SATURDAYS, AND  SUNDAYS (Patient taking differently: Take 12.5 mg by mouth See admin instructions. Take 1 capsule by mouth 4 times a week on Tuesdays, Thursdays, Saturdays and Sundays)  . [DISCONTINUED] nitrofurantoin, macrocrystal-monohydrate, (MACROBID) 100 MG capsule Take 100 mg by mouth 2 (two) times daily.      Allergies:   Patient has no known allergies.   Social History   Tobacco  Use  . Smoking status: Never Smoker  . Smokeless tobacco: Never Used  Vaping Use  . Vaping Use: Never used  Substance Use Topics  . Alcohol use: No  . Drug use: No     Family Hx: The patient's family history includes Hypertension in her mother; Stroke in her sister.  ROS:   Please see the history of present illness.   All other systems are reviewed and are negative.   Prior CV studies:   The following studies were reviewed today:  ED visit 10/28 and 07/19/2020  Labs/Other Tests and Data Reviewed:    EKG:  Reviewed the ECG from 11/17 - SR with PACs, LVH, borderline left axis  Recent Labs: 07/19/2020: BUN 10; Creatinine, Ser 0.57; Hemoglobin 10.6; Platelets 423; Potassium 3.6; Sodium 137   Recent Lipid Panel No results found for: CHOL, TRIG, HDL, CHOLHDL, LDLCALC, LDLDIRECT  Wt Readings from Last 3 Encounters:  08/03/20 146 lb (66.2 kg)  07/19/20 150 lb (68 kg)  12/07/18 145 lb (65.8 kg)     Objective:    Vital Signs:  BP 122/70 (BP Location: Left Arm, Patient Position: Sitting, Cuff Size: Normal)   Pulse 69   Ht 5\' 4"  (1.626 m)   Wt 146 lb (66.2 kg)   BMI 25.06 kg/m     General: Alert, oriented x3, no distress, appears well. Mild scoliosis Head: no evidence of trauma, PERRL, EOMI, no exophtalmos or lid lag, no myxedema, no xanthelasma; normal ears, nose and oropharynx Neck: normal jugular venous pulsations and no hepatojugular reflux; brisk carotid pulses without delay and no carotid bruits Chest: clear to auscultation, no signs of consolidation by percussion or palpation, normal fremitus, symmetrical and full respiratory excursions Cardiovascular: normal position and quality of the apical impulse, regular rhythm with rare ectopy, normal first and second heart sounds, no murmurs, rubs or gallops Abdomen: no tenderness or distention, no masses by palpation, no abnormal pulsatility or arterial bruits, normal bowel sounds, no hepatosplenomegaly Extremities: no  clubbing, cyanosis or edema; 2+ radial, ulnar and brachial pulses bilaterally; 2+ right femoral, posterior tibial and dorsalis pedis pulses; 2+ left femoral, posterior tibial and dorsalis pedis pulses; no subclavian or femoral bruits Neurological: grossly nonfocal Psych: Normal mood and affect   ASSESSMENT & PLAN:    1. Essential hypertension   2. Paroxysmal atrial fibrillation (HCC)   3. Long term (current) use of anticoagulants      1. HTN: relatively low BP and recent fall. Will reduce the HCTZ to just 2x/week 2. AFib: no palpitations and no neur events (was never aware of the rrhythmia). 3. Xarelto: had the recent fall, but otherwise no injuries or bleeding issues.  Patient Instructions  Medication Instructions:  Take the Hydrochlorothiazide 12.5 mg twice weekly, once on Monday and Friday  *If you need a refill  on your cardiac medications before your next appointment, please call your pharmacy*   Lab Work: None ordered If you have labs (blood work) drawn today and your tests are completely normal, you will receive your results only by: Marland Kitchen MyChart Message (if you have MyChart) OR . A paper copy in the mail If you have any lab test that is abnormal or we need to change your treatment, we will call you to review the results.   Testing/Procedures: None ordered   Follow-Up: At [facility], you and your health needs are our priority.  As part of our continuing mission to provide you with exceptional heart care, we have created designated Provider Care Teams.  These Care Teams include your primary Cardiologist (physician) and Advanced Practice Providers (APPs -  Physician Assistants and Nurse Practitioners) who all work together to provide you with the care you need, when you need it.  We recommend signing up for the patient portal called "MyChart".  Sign up information is provided on this After Visit Summary.  MyChart is used to connect with patients for Virtual Visits  (Telemedicine).  Patients are able to view lab/test results, encounter notes, upcoming appointments, etc.  Non-urgent messages can be sent to your provider as well.   To learn more about what you can do with MyChart, go to [email or web address].    Your next appointment:   12 month(s)  The format for your next appointment:   In Person  Provider:   You may see Sanda Klein, MD or one of the following Advanced Practice Providers on your designated Care Team:    Almyra Deforest, PA-C  Fabian Sharp, [place] or   Roby Lofts, PA-C       Signed, Sanda Klein, MD  08/05/2020 10:09 AM    [place]

## 2020-08-07 ENCOUNTER — Emergency Department (HOSPITAL_COMMUNITY)
Admission: EM | Admit: 2020-08-07 | Discharge: 2020-08-07 | Disposition: A | Discharge status: Home / Self Care | Payer: Medicare Other | Attending: Emergency Medicine | Admitting: Emergency Medicine

## 2020-08-07 ENCOUNTER — Encounter (HOSPITAL_COMMUNITY): Payer: Self-pay

## 2020-08-07 ENCOUNTER — Other Ambulatory Visit: Payer: Self-pay

## 2020-08-07 DIAGNOSIS — R531 Weakness: Secondary | ICD-10-CM | POA: Insufficient documentation

## 2020-08-07 DIAGNOSIS — I10 Essential (primary) hypertension: Secondary | ICD-10-CM | POA: Diagnosis not present

## 2020-08-07 LAB — CBC
HCT: 37.3 % (ref 36.0–46.0)
Hemoglobin: 11.4 g/dL — ABNORMAL LOW (ref 12.0–15.0)
MCH: 26.9 pg (ref 26.0–34.0)
MCHC: 30.6 g/dL (ref 30.0–36.0)
MCV: 88 fL (ref 80.0–100.0)
Platelets: 332 10*3/uL (ref 150–400)
RBC: 4.24 MIL/uL (ref 3.87–5.11)
RDW: 21.6 % — ABNORMAL HIGH (ref 11.5–15.5)
WBC: 8.9 10*3/uL (ref 4.0–10.5)
nRBC: 0 % (ref 0.0–0.2)

## 2020-08-07 LAB — URINALYSIS, ROUTINE W REFLEX MICROSCOPIC
Bilirubin Urine: NEGATIVE
Glucose, UA: NEGATIVE mg/dL
Hgb urine dipstick: NEGATIVE
Ketones, ur: NEGATIVE mg/dL
Nitrite: NEGATIVE
Protein, ur: NEGATIVE mg/dL
Specific Gravity, Urine: 1.008 (ref 1.005–1.030)
WBC, UA: >50 WBC/hpf — ABNORMAL HIGH (ref 0–5)
pH: 7 (ref 5.0–8.0)

## 2020-08-07 LAB — BASIC METABOLIC PANEL
Anion gap: 10 (ref 5–15)
BUN: 10 mg/dL (ref 8–23)
CO2: 23 mmol/L (ref 22–32)
Calcium: 9.5 mg/dL (ref 8.9–10.3)
Chloride: 105 mmol/L (ref 98–111)
Creatinine, Ser: 0.6 mg/dL (ref 0.44–1.00)
GFR, Estimated: >60 mL/min (ref >60)
Glucose, Bld: 103 mg/dL — ABNORMAL HIGH (ref 70–99)
Potassium: 4.8 mmol/L (ref 3.5–5.1)
Sodium: 138 mmol/L (ref 135–145)

## 2020-08-07 LAB — CK: Total CK: 699 U/L — ABNORMAL HIGH (ref 38–234)

## 2020-08-07 NOTE — ED Notes (Signed)
Pt placed on purewick at 70 mmHg. 

## 2020-08-07 NOTE — ED Triage Notes (Signed)
Per [facility], Pt, from home, c/o increased weakness starting this morning.  Denies pain.  Pt reports she was recently diagnoses w/ an UTI and started on Keflex. A & Ox4.

## 2020-08-07 NOTE — ED Provider Notes (Signed)
[facility] Emergency Department Provider Note MRN:  [id number]  Arrival date & time: 08/07/20     Chief Complaint   Weakness   History of Present Illness   Nancy Hayes is a 86 y.o. year-old female with a history of A. fib presenting to the ED with chief complaint of weakness.  Chronic leg weakness, somewhat worse this morning.  Still able to walk but not as well.  Denies fever, no headache or vision change, no neck or back pain, no chest pain or shortness of breath, no abdominal pain, no burning with urination, no numbness.  No bowel or bladder dysfunction.  Review of Systems  A complete 10 system review of systems was obtained and all systems are negative except as noted in the HPI and PMH.   Patient's Health History    Past Medical History:  Diagnosis Date  . AC (acromioclavicular) joint bone spurs    neck  . Arthritis   . Atrial fibrillation ([place])   . Dizzy spells   . Dysrhythmia   . Gout   . History of kidney stones   . Hypertension   . Macular degeneration of both eyes    right eye  . Renal disorder    pt denies  . UTI (lower urinary tract infection)   . Vertigo   . Vision abnormalities     Past Surgical History:  Procedure Laterality Date  . BALLOON DILATION N/A 12/05/2017   Procedure: BALLOON DILATION;  Surgeon: Carol Ada, MD;  Location: [facility];  Service: Endoscopy;  Laterality: N/A;  . CATARACT EXTRACTION    . ESOPHAGOGASTRODUODENOSCOPY (EGD) WITH PROPOFOL N/A 12/05/2017   Procedure: ESOPHAGOGASTRODUODENOSCOPY (EGD) WITH PROPOFOL;  Surgeon: Carol Ada, MD;  [facility];  Service: Endoscopy;  Laterality: N/A;  . EXTRACORPOREAL SHOCK WAVE LITHOTRIPSY Right 05/26/2017   Procedure: EXTRACORPOREAL SHOCK WAVE LITHOTRIPSY (ESWL);  Surgeon: Nickie Retort, MD;  Location: [facility];  Service: Urology;  Laterality: Right;  . PARTIAL HYSTERECTOMY     pt states total hysterectomy    Family History  Problem Relation Age  of Onset  . Hypertension Mother   . Stroke Sister     Social History   Socioeconomic History  . Marital status: Widowed    Spouse name: Not on file  . Number of children: Not on file  . Years of education: Not on file  . Highest education level: Not on file  Occupational History  . Not on file  Tobacco Use  . Smoking status: Never Smoker  . Smokeless tobacco: Never Used  Vaping Use  . Vaping Use: Never used  Substance and Sexual Activity  . Alcohol use: No  . Drug use: No  . Sexual activity: Not Currently  Other Topics Concern  . Not on file  Social History Narrative  . Not on file   Social Determinants of Health   Financial Resource Strain:   . Difficulty of Paying Living Expenses: Not on file  Food Insecurity:   . Worried About Charity fundraiser in the Last Year: Not on file  . Ran Out of Food in the Last Year: Not on file  Transportation Needs:   . Lack of Transportation (Medical): Not on file  . Lack of Transportation (Non-Medical): Not on file  Physical Activity:   . Days of Exercise per Week: Not on file  . Minutes of Exercise per Session: Not on file  Stress:   . Feeling of Stress : Not on  file  Social Connections:   . Frequency of Communication with Friends and Family: Not on file  . Frequency of Social Gatherings with Friends and Family: Not on file  . Attends Religious Services: Not on file  . Active Member of Clubs or Organizations: Not on file  . Attends Archivist Meetings: Not on file  . Marital Status: Not on file  Intimate Partner Violence:   . Fear of Current or Ex-Partner: Not on file  . Emotionally Abused: Not on file  . Physically Abused: Not on file  . Sexually Abused: Not on file     Physical Exam   Vitals:   08/07/20 1415 08/07/20 1430  BP:  (!) 179/76  Pulse: 69 68  Resp: 19 19  Temp:    SpO2: 94% 96%    CONSTITUTIONAL: Chronically ill-appearing, NAD NEURO:  Alert and oriented x 3, no focal deficits EYES:  eyes  equal and reactive ENT/NECK:  no LAD, no JVD CARDIO: Regular rate, well-perfused, normal S1 and S2 PULM:  CTAB no wheezing or rhonchi GI/GU:  normal bowel sounds, non-distended, non-tender MSK/SPINE:  No gross deformities, no edema SKIN:  no rash, atraumatic PSYCH:  Appropriate speech and behavior  *Additional and/or pertinent findings included in MDM below  Diagnostic and Interventional Summary    EKG Interpretation  Date/Time:  Monday August 07 2020 09:49:40 EST Ventricular Rate:  66 PR Interval:    QRS Duration: 77 QT Interval:  550 QTC Calculation: 577 R Axis:   -35 Text Interpretation: Sinus rhythm Left axis deviation Borderline repolarization abnormality Prolonged QT interval Confirmed by Gerlene Fee [phone]) on 08/07/2020 10:59:36 AM      Labs Reviewed  CBC - Abnormal; Notable for the following components:      Result Value   Hemoglobin 11.4 (*)    RDW 21.6 (*)    All other components within normal limits  BASIC METABOLIC PANEL - Abnormal; Notable for the following components:   Glucose, Bld 103 (*)    All other components within normal limits  URINALYSIS, ROUTINE W REFLEX MICROSCOPIC - Abnormal; Notable for the following components:   APPearance HAZY (*)    Leukocytes,Ua LARGE (*)    WBC, UA >50 (*)    Bacteria, UA RARE (*)    All other components within normal limits  CK - Abnormal; Notable for the following components:   Total CK 699 (*)    All other components within normal limits  URINE CULTURE    No orders to display    Medications - No data to display   Procedures  /  Critical Care Procedures  ED Course and Medical Decision Making  I have reviewed the triage vital signs, the nursing notes, and pertinent available records from the EMR.  Listed above are laboratory and imaging tests that I personally ordered, reviewed, and interpreted and then considered in my medical decision making (see below for details).  Considering metabolic disarray,  rhabdomyolysis, UTI, deconditioning, patient is overall with a reassuring neurological exam, has appropriate and symmetric strength and sensation on my exam, there is no bowel or bladder dysfunction, nothing to suggest myelopathy.  Suspect patient's work-up will be reassuring and she will continue to have normal vital signs and we will consult case management for PT or other services at home.     Work-up overall reassuring, mild CK elevation.  No renal impairment, patient has no pain.  No emergent process, case management/social work consulted to aid patient at home, suspect  the majority of her weakness is due to deconditioning.  She is feeling better than when she came in, explains that she typically feels weaker in the morning and improves during the day.  Appropriate for discharge.  Barth Kirks. [place]onia Small, [place] [email or web address]  Final Clinical Impressions(s) / ED Diagnoses     ICD-10-CM   1. Weakness  R53.1     ED Discharge Orders    None       Discharge Instructions Discussed with and Provided to Patient:     Discharge Instructions     You were evaluated in the Emergency Department and after careful evaluation, we did not find any emergent condition requiring admission or further testing in the hospital.  Your exam/testing today was overall reassuring.  We recommend close follow-up with your primary care doctor.  Please return to the Emergency Department if you experience any worsening of your condition.  Thank you for allowing [place] to be a part of your care.        Maudie Flakes, MD 08/07/20 1544

## 2020-08-07 NOTE — TOC Initial Note (Signed)
Transition of Care [facility]) - Initial/Assessment Note    Patient Details  Name: Nancy Hayes MRN: [id number] Date of Birth: May 26, 1927  [facility]) CM/SW Contact:    Erenest Rasher, RN Phone Number: [phone] 08/07/2020, 4:32 PM  Clinical Narrative:                 TOC CM spoke to pt and son, Nancy Hayes. Pt is requesting to go home with [facility]. Son states he has used [facility] in the past. Referral sent to [facility] rep, Nancy Hayes for review. Son states pt has seen PCP, Dr Deatra Ina two weeks ago. Gave permission to review visit. Pt has RW, wheelchair, BSC, and rollator at home. ED provider and ED RN updated.   Expected Discharge Plan: [place] Barriers to Discharge: No Barriers Identified   Patient Goals and CMS Choice Patient states their goals for this hospitalization and ongoing recovery are:: patient prefers to go home with [facility] CMS Medicare.gov Compare Post Acute Care list provided to:: Patient Choice offered to / list presented to : Patient, Adult Children  Expected Discharge Plan and Services Expected Discharge Plan: [place] In-house Referral: Clinical Social Work Discharge Planning Services: CM Consult Post Acute Care Choice: [place] arrangements for the past 2 months: Single Family Home                           HH Arranged: RN, PT, OT, Nurse's Aide, Social Work [employer] Agency: [facility] [facility] [employer] Date [place]: 08/07/20 Time [place]: 1611 Representative spoke with at [place]: Nancy Hayes  Prior Living Arrangements/Services Living arrangements for the past 2 months: [place] Lives with:: Adult Children Patient language and need for interpreter reviewed:: Yes Do you feel safe going back to the place where you live?: Yes      Need for Family Participation in Patient Care: Yes (Comment) Care giver support system in place?: Yes (comment) Current home  services: DME (wheelchair, rolling walker, rollator, bedside commode) Criminal Activity/Legal Involvement Pertinent to Current Situation/Hospitalization: No - Comment as needed  Activities of Daily Living      Permission Sought/Granted Permission sought to share information with : Case Manager, PCP, Family Supports Permission granted to share information with : Yes, Verbal Permission Granted  Share Information with NAME: Nancy Hayes  Permission granted to share info w AGENCY: [facility] [employer] granted to share info w Relationship: son  Permission granted to share info w Contact Information: [phone]  Emotional Assessment       Orientation: : Oriented to Self, Oriented to Place, Oriented to  Time, Oriented to Situation   Psych Involvement: No (comment)  Admission diagnosis:  Weakness Patient Active Problem List   Diagnosis Date Noted  . Generalized weakness   . Paroxysmal atrial fibrillation (HCC)   . Tachycardia-bradycardia syndrome ([place])   . Hypokalemia 04/08/2017  . Sepsis ([place]) 04/06/2017  . Essential hypertension 11/23/2015  . PACs (premature atrial contraction)  with trigeminy 06/29/2015   PCP:  Aletha Halim., PA-C Pharmacy:   [facility], [address] [place] [address] Phone: [phone] Fax: [facility], [address], [address] [place], [place] [address] [place] [address] Phone: [phone] Fax: [phone]     Social  Determinants of Health (SDOH) Interventions    Readmission Risk Interventions No flowsheet data found.

## 2020-08-07 NOTE — Discharge Instructions (Addendum)
You were evaluated in the Emergency Department and after careful evaluation, we did not find any emergent condition requiring admission or further testing in the hospital.  Your exam/testing today was overall reassuring.  We recommend close follow-up with your primary care doctor.  Please return to the Emergency Department if you experience any worsening of your condition.  Thank you for allowing [place] to be a part of your care.

## 2020-08-07 NOTE — ED Notes (Signed)
Patient given a beverage.  

## 2020-08-09 LAB — URINE CULTURE: Culture: 60000 — AB

## 2020-08-10 ENCOUNTER — Telehealth: Payer: Self-pay

## 2020-08-10 NOTE — Telephone Encounter (Signed)
Post ED Visit - Positive Culture Follow-up: Unsuccessful Patient Follow-up  Culture assessed and recommendations reviewed by:  []  Elenor Quinones, Pharm.D. []  Heide Guile, Pharm.D., BCPS AQ-ID []  Parks Neptune, Pharm.D., BCPS []  Alycia Rossetti, Pharm.D., BCPS []  [place], [place].D., BCPS, AAHIVP []  Legrand Como, Pharm.D., BCPS, AAHIVP []  Wynell Balloon, PharmD []  Vincenza Hews, PharmD, BCPS Clarisa [employer] Pharm D Positive urine culture Check to see if PCP but on abx if not then Cipro 500 mg x 7 days [x]  Patient discharged without antimicrobial prescription and treatment is now indicated []  Organism is resistant to prescribed ED discharge antimicrobial []  Patient with positive blood cultures   Unable to contact patient after 3 attempts, letter will be sent to address on file  Genia Del 08/10/2020, 11:26 AM

## 2020-08-10 NOTE — Progress Notes (Signed)
ED Antimicrobial Stewardship Positive Culture Follow Up   Tulsi NEVIA HENKIN is an 86 y.o. female who presented to [facility] on 08/07/2020 with a chief complaint of  Chief Complaint  Patient presents with  . Weakness    Recent Results (from the past 720 hour(s))  Urine culture     Status: Abnormal   Collection Time: 07/19/20  1:49 PM   Specimen: Urine, Clean Catch  Result Value Ref Range Status   Specimen Description   Final    URINE, CLEAN CATCH Performed at [facility], [address], [place], [place] [address]    Special Requests   Final    NONE Performed at [facility], [address], [place], [place] [address]    Culture >=100,000 COLONIES/mL PSEUDOMONAS AERUGINOSA (A)  Final   Report Status 07/22/2020 FINAL  Final   Organism ID, Bacteria PSEUDOMONAS AERUGINOSA (A)  Final      Susceptibility   Pseudomonas aeruginosa - MIC*    CEFTAZIDIME <=1 SENSITIVE Sensitive     CIPROFLOXACIN <=0.25 SENSITIVE Sensitive     GENTAMICIN <=1 SENSITIVE Sensitive     IMIPENEM 1 SENSITIVE Sensitive     PIP/TAZO <=4 SENSITIVE Sensitive     CEFEPIME 2 SENSITIVE Sensitive     * >=100,000 COLONIES/mL PSEUDOMONAS AERUGINOSA  Urine culture     Status: Abnormal   Collection Time: 08/07/20 10:48 AM   Specimen: Urine, Random  Result Value Ref Range Status   Specimen Description   Final    URINE, RANDOM Performed at [facility], [place] [address], [place], [place] [address]    Special Requests   Final    NONE Performed at [facility], [place] [address], [place], [place] [address]    Culture 60,000 COLONIES/mL PSEUDOMONAS AERUGINOSA (A)  Final   Report Status 08/09/2020 FINAL  Final   Organism ID, Bacteria PSEUDOMONAS AERUGINOSA (A)  Final      Susceptibility   Pseudomonas aeruginosa - MIC*    CEFTAZIDIME <=1 SENSITIVE Sensitive     CIPROFLOXACIN <=0.25 SENSITIVE Sensitive     GENTAMICIN <=1 SENSITIVE Sensitive     IMIPENEM 1 SENSITIVE Sensitive      PIP/TAZO <=4 SENSITIVE Sensitive     CEFEPIME 2 SENSITIVE Sensitive     * 60,000 COLONIES/mL PSEUDOMONAS AERUGINOSA    []  Treated with --- organism resistant to prescribed antimicrobial [x]  Patient discharged originally without antimicrobial agent and treatment is now indicated  New antibiotic prescription:  - Please check if pt visited PCP and if PCP  Called anything in. - If not please call in Cipro 500 mg PO BID x 7 days   ED Provider: Isla Pence, MD     Royetta Asal, PharmD, BCPS 08/10/2020 11:16 AM

## 2020-10-02 ENCOUNTER — Other Ambulatory Visit: Payer: Self-pay | Admitting: Cardiovascular Disease

## 2020-12-18 ENCOUNTER — Other Ambulatory Visit: Payer: Self-pay | Admitting: Cardiovascular Disease

## 2020-12-18 NOTE — Telephone Encounter (Signed)
Rx has been sent to the pharmacy electronically. ° °

## 2020-12-25 DIAGNOSIS — R1310 Dysphagia, unspecified: Secondary | ICD-10-CM | POA: Insufficient documentation

## 2020-12-25 DIAGNOSIS — K222 Esophageal obstruction: Secondary | ICD-10-CM | POA: Insufficient documentation

## 2021-04-04 ENCOUNTER — Telehealth: Payer: Self-pay | Admitting: *Deleted

## 2021-04-04 NOTE — Telephone Encounter (Signed)
RECEIVED NOTES FROM [place]  Family Medicine

## 2021-04-17 ENCOUNTER — Other Ambulatory Visit: Payer: Self-pay

## 2021-04-17 ENCOUNTER — Ambulatory Visit: Payer: Medicare Other | Attending: Family Medicine

## 2021-04-17 DIAGNOSIS — R293 Abnormal posture: Secondary | ICD-10-CM | POA: Insufficient documentation

## 2021-04-17 DIAGNOSIS — M5442 Lumbago with sciatica, left side: Secondary | ICD-10-CM | POA: Diagnosis present

## 2021-04-17 DIAGNOSIS — M6281 Muscle weakness (generalized): Secondary | ICD-10-CM | POA: Insufficient documentation

## 2021-04-17 DIAGNOSIS — Z9181 History of falling: Secondary | ICD-10-CM | POA: Insufficient documentation

## 2021-04-19 NOTE — Therapy (Signed)
[facility] [place], [place], [address] Phone: [phone]   Fax:  [phone]  Physical Therapy Evaluation  Patient Details  Name: Nancy Hayes MRN: [id number] Date of Birth: June 14, 1927 Referring Provider (PT): Nancy Hayes   Encounter Date: 04/17/2021     04/17/21 1430  PT Visits / Re-Eval  Visit Number 1  Number of Visits 6  Date for PT Re-Evaluation 05/29/21  Authorization  Authorization Type Medicare : KX MODIFIER AFTER 15 VISITS.  PT Time Calculation  PT Start Time 1345  PT Stop Time 1440  PT Time Calculation (min) 55 min  PT - End of Session  Equipment Utilized During Treatment Gait belt  Activity Tolerance Patient tolerated treatment well;Patient limited by fatigue  Behavior During Therapy WFL for tasks assessed/performed     Past Medical History:  Diagnosis Date   AC (acromioclavicular) joint bone spurs    neck   Arthritis    Atrial fibrillation ([place])    Dizzy spells    Dysrhythmia    Gout    History of kidney stones    Hypertension    Macular degeneration of both eyes    right eye   Renal disorder    pt denies   UTI (lower urinary tract infection)    Vertigo    Vision abnormalities     Past Surgical History:  Procedure Laterality Date   BALLOON DILATION N/A 12/05/2017   Procedure: BALLOON DILATION;  Surgeon: Nancy Ada, MD;  Location: [facility];  Service: Endoscopy;  Laterality: N/A;   CATARACT EXTRACTION     ESOPHAGOGASTRODUODENOSCOPY (EGD) WITH PROPOFOL N/A 12/05/2017   Procedure: ESOPHAGOGASTRODUODENOSCOPY (EGD) WITH PROPOFOL;  Surgeon: Nancy Ada, MD;  Location: [facility];  Service: Endoscopy;  Laterality: N/A;   EXTRACORPOREAL SHOCK WAVE LITHOTRIPSY Right 05/26/2017   Procedure: EXTRACORPOREAL SHOCK WAVE LITHOTRIPSY (ESWL);  Surgeon: Nancy Retort, MD;  Location: [facility];  Service: Urology;  Laterality: Right;   PARTIAL HYSTERECTOMY     pt states total  hysterectomy       04/17/21 1400  Symptoms/Limitations  Subjective COVID-19 screen performed prior to patient entering clinic.Patient and don arrive to PT with concers for patient progressive weakness in her legs. She has had a fall two weeks ago as she was turning around to flush the commode, she tripped and fell into the shower door then to the floor. She denies having lost conciousness or having hit her head. She and son are concerned about her frequent UTIs as she developed one that went to her bloodstream and consequently made her legs weak. She used to be able to get around with a rollator and a walker although she prefers the walker as she has difficulty managing the brakes on the rollator. She has a lift chair that she is getting used to using as well. She generally only wears slide on shoes as her toe nails are too long and due to potential fungal infection, have callused significantly. She likes to do exercises in her chair everyday such as kick forward and march while seated. She has a limited appetite, per son's report. She is currently taking an antibiotic. She remembers PT helping her feel better befor and she wants to try that again. She has an earache on the L ear and difficulty hearing, and macular degeneration of the R eye that is progressively worse. She has a history of a pinched nerve at the cervical spine per her reports if she were to get into  an accident, she would likely be paralyzed. She states that she was informed that she is not appropriate for surgical intervention.  She is concerned that she had some mild cramping in her hands without cause a few times lately. She denies difficulty swallowing or breathing.  Patient is accompained by: Family member (son Nancy Hayes)  Pertinent History severe osteopenia, hx of falls, cervicalgia/stenosis, low back pain, UTIs/incontinence  Limitations Sitting;Walking;Writing;Lifting;House hold activities;Reading;Standing  Patient Stated Goals to  feel better and have more strength in her legs  Pain Assessment  Currently in Pain? Other (Comment) ("I'm always in pain all over but i'm fine")     04/17/21 0001  Assessment  Medical Diagnosis Generalized Weakness/Deconditioning  Referring Provider (PT) Nancy Hayes  Onset Date/Surgical Date 04/03/21 (chronic conditions/repeated falls (most recent concerns for weakness 2 weeks ago))  Precautions  Precautions Fall  Balance Screen  Has the patient fallen in the past 6 months Yes  How many times? 2 or more (son is not sure)  Home Environment  Living Environment Private residence  Living Arrangements Children  Prior Function  Level of [place] for independence;Needs assistance with ADLs  Cognition  Overall Cognitive Status Within Functional Limits for tasks assessed (hard of hearing)  Observation/Other Assessments  Observations hoffmans +  Skin Integrity swelling at the L ankle lateral  Cranial Nerve(s) cn INTACT / UNREMARKABLE  Sensation  Light Touch Impaired Detail  Light Touch Impaired Details Impaired RUE;Impaired LLE  Functional Tests  Functional tests Sit to Stand  Sit to Stand  Comments requires min A and wheelchair blocking as she weightshifts postreriorly and nearly tips the chair, takes increased time with use of UE support and multiple attempts for rising  Posture/Postural Control  Posture/Postural Control Postural limitations  Postural Limitations Rounded Shoulders;Forward head;Increased lumbar lordosis;Increased thoracic kyphosis  Posture Comments R side convex thoracic spine  Deep Tendon Reflexes  DTR Assessment Site Biceps;Brachioradialis;Triceps  Biceps DTR normal  Brachioradialis DTR trace, or seen only with reinforcement  Triceps DTR trace, or seen only with reinforcement  ROM / Strength  AROM / PROM / Strength Strength;AROM  AROM  Overall AROM  Deficits  Overall AROM Comments Unable to achieve neutral lumopelvic  posture with standing  Strength  Overall Strength Within functional limits for tasks performed  Overall Strength Comments Fair LE strength - 3/5 however limited functional strength for gait or transfers  Bed Mobility  Bed Mobility Not assessed  Transfers  Transfers Sit to Stand  Sit to Stand 4: Min assist;With upper extremity assist;With armrests;Multiple attempts;Other (comment) (with gait belt donned)  Sit to Stand Details Manual facilitation for weight shifting;Tactile cues for initiation  Ambulation/Gait  Ambulation/Gait No  Gait Comments shuffled initiation with wheelchair follow ; due to poor performance, further ambulation was not encouraged. Vitals remained within therapeutic levels, patient without change in alert status       Objective measurements completed on examination: See above findings.      PT Long Term Goals - 04/17/21 1400       PT LONG TERM GOAL #1   Title Independent with a HEP.    Time 6    Period Weeks    Target Date 05/29/21      PT LONG TERM GOAL #2   Title Perform ADL's with pain not > 2-3/10.    Time 6    Period Weeks    Status New    Target Date 05/29/21      PT LONG TERM  GOAL #3   Title Patient will be able to perform sit to stand transfer with SBA/supervision    Baseline minA with gait belt and miultiple attmempts    Time 6    Period Weeks    Status New    Target Date 05/29/21      PT LONG TERM GOAL #4   Title Patient will be able to perform standing 360 degrees without loss of balance    Baseline unable to progress initial steps    Time 6    Period Weeks    Status New    Target Date 05/29/21                     Patient will benefit from skilled therapeutic intervention in order to improve the following deficits and impairments:  Abnormal gait, Decreased balance, Decreased endurance, Decreased mobility, Decreased skin integrity, Difficulty walking, Hypomobility, Impaired sensation, Impaired vision/preception,  Decreased range of motion, Increased edema, Decreased activity tolerance, Decreased strength, Impaired flexibility, Postural dysfunction  Visit Diagnosis: Muscle weakness (generalized)  Abnormal posture  At risk for falls     Problem List Patient Active Problem List   Diagnosis Date Noted   Generalized weakness    Paroxysmal atrial fibrillation ([facility])    Tachycardia-bradycardia syndrome ([place])    Hypokalemia 04/08/2017   Sepsis ([place]) 04/06/2017   Essential hypertension 11/23/2015   PACs (premature atrial contraction)  with trigeminy 06/29/2015    Marylou Mccoy PT, DPT 04/19/2021, 10:59 AM  [facility] [address] [place], [place], [address] Phone: [phone]   Fax:  [phone]  Name: Nancy Hayes MRN: [id number] Date of Birth: [birth year]

## 2021-04-24 ENCOUNTER — Other Ambulatory Visit: Payer: Self-pay

## 2021-04-24 ENCOUNTER — Ambulatory Visit: Payer: Medicare Other | Admitting: *Deleted

## 2021-04-24 DIAGNOSIS — M6281 Muscle weakness (generalized): Secondary | ICD-10-CM

## 2021-04-24 DIAGNOSIS — R293 Abnormal posture: Secondary | ICD-10-CM

## 2021-04-24 DIAGNOSIS — Z9181 History of falling: Secondary | ICD-10-CM

## 2021-04-24 NOTE — Therapy (Signed)
[facility] [place], [place], [address] Phone: [phone]   Fax:  [phone]  Physical Therapy Treatment  Patient Details  Name: Nancy Hayes MRN: [id number] Date of Birth: May 18, 1927 Referring Provider (PT): Myra Rude nPA-C   Encounter Date: 04/24/2021   PT End of Session - 04/24/21 [id number]     Visit Number 2    Number of Visits 6    Date for PT Re-Evaluation 05/29/21    Authorization Type Medicare : KX MODIFIER AFTER 15 VISITS.    PT Start Time [id number]    PT Stop Time 1435    PT Time Calculation (min) 50 min             Past Medical History:  Diagnosis Date   AC (acromioclavicular) joint bone spurs    neck   Arthritis    Atrial fibrillation ([facility])    Dizzy spells    Dysrhythmia    Gout    History of kidney stones    Hypertension    Macular degeneration of both eyes    right eye   Renal disorder    pt denies   UTI (lower urinary tract infection)    Vertigo    Vision abnormalities     Past Surgical History:  Procedure Laterality Date   BALLOON DILATION N/A 12/05/2017   Procedure: BALLOON DILATION;  Surgeon: Carol Ada, MD;  [facility];  Service: Endoscopy;  Laterality: N/A;   CATARACT EXTRACTION     ESOPHAGOGASTRODUODENOSCOPY (EGD) WITH PROPOFOL N/A 12/05/2017   Procedure: ESOPHAGOGASTRODUODENOSCOPY (EGD) WITH PROPOFOL;  Surgeon: Carol Ada, MD;  [facility];  Service: Endoscopy;  Laterality: N/A;   EXTRACORPOREAL SHOCK WAVE LITHOTRIPSY Right 05/26/2017   Procedure: EXTRACORPOREAL SHOCK WAVE LITHOTRIPSY (ESWL);  Surgeon: Nickie Retort, MD;  [facility];  Service: Urology;  Laterality: Right;   PARTIAL HYSTERECTOMY     pt states total hysterectomy    There were no vitals filed for this visit.   Subjective Assessment - 04/24/21 1404     Subjective COVID-19 screen performed prior to patient entering clinic Doing ok. F/U with  MD about possible UTI on Thurs.  HR  86  and 94%O2 prior to Rx    Pertinent History severe osteopenia, hx of falls, cervicalgia/stenosis, low back pain, UTIs/incontinence    Patient Stated Goals to feel better and have more strength in her legs                               [facility] Adult PT Treatment/Exercise - 04/24/21 0001       Transfers   Transfers Sit to Stand    Sit to Stand 4: Min assist   with gait belt   Transfer Cueing stand tall , square up before sitting      Exercises   Exercises Knee/Hip      Knee/Hip Exercises: Standing   Other Standing Knee Exercises Seated DF and PF 2x10 Bil.      Knee/Hip Exercises: Seated   Long Arc Quad Both;2 sets;10 reps   hold 5 sec   Long Arc Quad Weight 2 lbs.    Ball Squeeze x10 hold 10 secs    Clamshell with TheraBand Yellow   2x10 hold 5 secs   Hamstring Curl Strengthening;Both;1 set;10 reps   hold 5 secs   Sit to Sand 10 reps;with UE support   hold stand times for 1 min  PT Long Term Goals - 04/17/21 1400       PT LONG TERM GOAL #1   Title Independent with a HEP.    Time 6    Period Weeks    Target Date 05/29/21      PT LONG TERM GOAL #2   Title Perform ADL's with pain not > 2-3/10.    Time 6    Period Weeks    Status New    Target Date 05/29/21      PT LONG TERM GOAL #3   Title Patient will be able to perform sit to stand transfer with SBA/supervision    Baseline minA with gait belt and miultiple attmempts    Time 6    Period Weeks    Status New    Target Date 05/29/21      PT LONG TERM GOAL #4   Title Patient will be able to perform standing 360 degrees without loss of balance    Baseline unable to progress initial steps    Time 6    Period Weeks    Status New    Target Date 05/29/21                   Plan - 04/24/21 1436     Clinical Impression Statement Pt arrived today doing fairly well with mainly weakness. Rx focused on functional sit to stand x10 with 1 min holds in  standing f/b sitting LE strengthening.. Gait belt used at all times. Cues needed for technique with exs as well as for transfers.    Personal Factors and Comorbidities Age;Comorbidity 2    Comorbidities osteopenia, repeated falls,    Examination-Participation Restrictions Driving;Cleaning;Shop;Community Activity    Stability/Clinical Decision Making Unstable/Unpredictable    Rehab Potential Fair    PT Frequency 1x / week    PT Duration 6 weeks    PT Treatment/Interventions ADLs/Self Care Home Management;Therapeutic activities;Therapeutic exercise;Balance training;Neuromuscular re-education;Gait training;Moist Heat;Energy conservation;Manual techniques;Patient/family education;Wheelchair mobility training    PT Next Visit Plan plan to perform seated transfers - squat pivot, sit to supine/sidelying, bed mobility, low level LE strength training on plinth             Patient will benefit from skilled therapeutic intervention in order to improve the following deficits and impairments:  Abnormal gait, Decreased balance, Decreased endurance, Decreased mobility, Decreased skin integrity, Difficulty walking, Hypomobility, Impaired sensation, Impaired vision/preception, Decreased range of motion, Increased edema, Decreased activity tolerance, Decreased strength, Impaired flexibility, Postural dysfunction  Visit Diagnosis: Muscle weakness (generalized)  Abnormal posture  At risk for falls     Problem List Patient Active Problem List   Diagnosis Date Noted   Generalized weakness    Paroxysmal atrial fibrillation ([facility])    Tachycardia-bradycardia syndrome ([place])    Hypokalemia 04/08/2017   Sepsis ([place]) 04/06/2017   Essential hypertension 11/23/2015   PACs (premature atrial contraction)  with trigeminy 06/29/2015    Sritha Chauncey,[name], PTA 04/24/2021, 5:58 PM  [facility] [address] [place], [place], [address] Phone: [phone]   Fax:   [phone]  Name: Nancy Hayes MRN: [id number] Date of Birth: Sep 07, 1926

## 2021-05-01 ENCOUNTER — Ambulatory Visit: Payer: Medicare Other

## 2021-05-01 ENCOUNTER — Other Ambulatory Visit: Payer: Self-pay

## 2021-05-01 DIAGNOSIS — M5442 Lumbago with sciatica, left side: Secondary | ICD-10-CM

## 2021-05-01 DIAGNOSIS — Z9181 History of falling: Secondary | ICD-10-CM

## 2021-05-01 DIAGNOSIS — R293 Abnormal posture: Secondary | ICD-10-CM

## 2021-05-01 DIAGNOSIS — M6281 Muscle weakness (generalized): Secondary | ICD-10-CM | POA: Diagnosis not present

## 2021-05-01 NOTE — Therapy (Signed)
[facility] [place], [place], [address] Phone: [phone]   Fax:  [phone]  Physical Therapy Treatment  Patient Details  Name: Nancy Hayes MRN: [id number] Date of Birth: 07/01/1927 Referring Provider (PT): Myra Rude nPA-C   Encounter Date: 05/01/2021   PT End of Session - 05/01/21 1543     Visit Number 3    Number of Visits 6    Date for PT Re-Evaluation 05/29/21    Authorization Type Medicare : KX MODIFIER AFTER 15 VISITS.    PT Start Time 1345    PT Stop Time 1430    PT Time Calculation (min) 45 min             Past Medical History:  Diagnosis Date   AC (acromioclavicular) joint bone spurs    neck   Arthritis    Atrial fibrillation ([facility])    Dizzy spells    Dysrhythmia    Gout    History of kidney stones    Hypertension    Macular degeneration of both eyes    right eye   Renal disorder    pt denies   UTI (lower urinary tract infection)    Vertigo    Vision abnormalities     Past Surgical History:  Procedure Laterality Date   BALLOON DILATION N/A 12/05/2017   Procedure: BALLOON DILATION;  Surgeon: Carol Ada, MD;  [facility];  Service: Endoscopy;  Laterality: N/A;   CATARACT EXTRACTION     ESOPHAGOGASTRODUODENOSCOPY (EGD) WITH PROPOFOL N/A 12/05/2017   Procedure: ESOPHAGOGASTRODUODENOSCOPY (EGD) WITH PROPOFOL;  Surgeon: Carol Ada, MD;  [facility];  Service: Endoscopy;  Laterality: N/A;   EXTRACORPOREAL SHOCK WAVE LITHOTRIPSY Right 05/26/2017   Procedure: EXTRACORPOREAL SHOCK WAVE LITHOTRIPSY (ESWL);  Surgeon: Nickie Retort, MD;  [facility]: [facility];  Service: Urology;  Laterality: Right;   PARTIAL HYSTERECTOMY     pt states total hysterectomy    There were no vitals filed for this visit.   Subjective Assessment - 05/01/21 1534     Subjective COVID-19 screen performed prior to patient entering clinic. Will begin a new antibioc as UTI cultures were reported  positive according to son. Otherwise she states she feels about the same.   HR 84  and 96%O2 prior to Rx    Patient is accompained by: Family member    Pertinent History severe osteopenia, hx of falls, cervicalgia/stenosis (possible cervical myelopathy), low back pain, UTIs/incontinence    Limitations Sitting;Walking;Writing;Lifting;House hold activities;Reading;Standing    Patient Stated Goals to feel better and have more strength in her legs, to be able to get to the toilet independently                               [facility] Adult PT Treatment/Exercise - 05/01/21 0001       Transfers   Transfers Sit to Stand;Stand Pivot Transfers    Sit to Stand 4: Min assist    Transfer Cueing hand placement with walker, upright posture      Ambulation/Gait   Ambulation/Gait Yes    Assistive device Rolling walker    Gait Pattern Step-to pattern    Ambulation Surface Level    Gait Comments with obstavles to navigate from plinth to plinth;      Posture/Postural Control   Posture/Postural Control Postural limitations    Postural Limitations Rounded Shoulders;Forward head;Increased lumbar lordosis;Increased thoracic kyphosis    Posture Comments R  side convex thoracic spine      Exercises   Exercises Other Exercises    Other Exercises  seated reclined with HABD (yellow tband) with vocal exercise of counting loud 2 x 12      Knee/Hip Exercises: Aerobic   Nustep 10 mins      Knee/Hip Exercises: Seated   Long Arc Quad Both;2 sets;10 reps    Marching 2 sets;10 reps    Sit to [employer] 10 reps;with UE support   onm RW                        PT Long Term Goals - 04/17/21 1400       PT LONG TERM GOAL #1   Title Independent with a HEP.    Time 6    Period Weeks    Target Date 05/29/21      PT LONG TERM GOAL #2   Title Perform ADL's with pain not > 2-3/10.    Time 6    Period Weeks    Status New    Target Date 05/29/21      PT LONG TERM GOAL #3   Title  Patient will be able to perform sit to stand transfer with SBA/supervision    Baseline minA with gait belt and miultiple attmempts    Time 6    Period Weeks    Status New    Target Date 05/29/21      PT LONG TERM GOAL #4   Title Patient will be able to perform standing 360 degrees without loss of balance    Baseline unable to progress initial steps    Time 6    Period Weeks    Status New    Target Date 05/29/21                   Plan - 05/01/21 1544     Clinical Impression Statement Patient arrived ready to participate in PT. Vitals remained in therapeutic ranges through0ut session. She is able to progress with walking with RW and able to progress with sit to stand transfers with close CGA. She was encouraged to use stong vocal cords and diaphragm for counting out loud and performing HABD. VCs to refrain from cervical extension provided ot prevent patient from aggravating signifcant cervical stenosis. VCs needed to prevent foot shuffling but she navigates floor obstacles very well. Skilled PT recommended to continue with goals to improve transfers and ease of ADLs such as toileting.    Personal Factors and Comorbidities Age;Comorbidity 2    Comorbidities osteopenia, repeated falls, cervical stenosis,    Examination-Activity Limitations Bed Mobility;Stand;Stairs;Transfers;Lift    Examination-Participation Restrictions Driving;Cleaning;Shop;Community Activity    Stability/Clinical Decision Making Unstable/Unpredictable    Clinical Decision Making Low             Patient will benefit from skilled therapeutic intervention in order to improve the following deficits and impairments:  Abnormal gait, Decreased balance, Decreased endurance, Decreased mobility, Decreased skin integrity, Difficulty walking, Hypomobility, Impaired sensation, Impaired vision/preception, Decreased range of motion, Increased edema, Decreased activity tolerance, Decreased strength, Impaired flexibility,  Postural dysfunction  Visit Diagnosis: Muscle weakness (generalized)  Abnormal posture  At risk for falls  Acute left-sided low back pain with left-sided sciatica     Problem List Patient Active Problem List   Diagnosis Date Noted   Generalized weakness    Paroxysmal atrial fibrillation (H[facility])    Tachycardia-bradycardia syndrome ([facility])    Hypokalemia 04/08/2017  Sepsis ([place]) 04/06/2017   Essential hypertension 11/23/2015   PACs (premature atrial contraction)  with trigeminy 06/29/2015    Marylou Mccoy PT, DPT 05/01/2021, 3:48 PM  [facility] [place], [place], [address] Phone: [phone]   Fax:  [phone]  Name: JALESSA RENNA MRN: [id number] Date of Birth: 06/12/1927

## 2021-05-05 ENCOUNTER — Emergency Department (HOSPITAL_COMMUNITY)
Admission: EM | Admit: 2021-05-05 | Discharge: 2021-05-05 | Disposition: A | Discharge status: Home / Self Care | Payer: Medicare Other | Attending: Emergency Medicine | Admitting: Emergency Medicine

## 2021-05-05 ENCOUNTER — Emergency Department (HOSPITAL_COMMUNITY): Payer: Medicare Other

## 2021-05-05 ENCOUNTER — Encounter (HOSPITAL_COMMUNITY): Payer: Self-pay

## 2021-05-05 DIAGNOSIS — I480 Paroxysmal atrial fibrillation: Secondary | ICD-10-CM | POA: Diagnosis not present

## 2021-05-05 DIAGNOSIS — Z79899 Other long term (current) drug therapy: Secondary | ICD-10-CM | POA: Insufficient documentation

## 2021-05-05 DIAGNOSIS — Z7901 Long term (current) use of anticoagulants: Secondary | ICD-10-CM | POA: Insufficient documentation

## 2021-05-05 DIAGNOSIS — I10 Essential (primary) hypertension: Secondary | ICD-10-CM | POA: Diagnosis not present

## 2021-05-05 DIAGNOSIS — N3000 Acute cystitis without hematuria: Secondary | ICD-10-CM

## 2021-05-05 DIAGNOSIS — R42 Dizziness and giddiness: Secondary | ICD-10-CM | POA: Diagnosis not present

## 2021-05-05 DIAGNOSIS — R32 Unspecified urinary incontinence: Secondary | ICD-10-CM | POA: Diagnosis present

## 2021-05-05 LAB — URINALYSIS, ROUTINE W REFLEX MICROSCOPIC
Bilirubin Urine: NEGATIVE
Glucose, UA: NEGATIVE mg/dL
Ketones, ur: NEGATIVE mg/dL
Nitrite: POSITIVE — AB
Protein, ur: NEGATIVE mg/dL
Specific Gravity, Urine: 1.01 (ref 1.005–1.030)
WBC, UA: >50 WBC/hpf — ABNORMAL HIGH (ref 0–5)
pH: 7.5 (ref 5.0–8.0)

## 2021-05-05 LAB — COMPREHENSIVE METABOLIC PANEL
ALT: 16 U/L (ref 0–44)
AST: 22 U/L (ref 15–41)
Albumin: 3.5 g/dL (ref 3.5–5.0)
Alkaline Phosphatase: 87 U/L (ref 38–126)
Anion gap: 7 (ref 5–15)
BUN: 17 mg/dL (ref 8–23)
CO2: 27 mmol/L (ref 22–32)
Calcium: 9.3 mg/dL (ref 8.9–10.3)
Chloride: 102 mmol/L (ref 98–111)
Creatinine, Ser: 0.61 mg/dL (ref 0.44–1.00)
GFR, Estimated: >60 mL/min (ref >60)
Glucose, Bld: 109 mg/dL — ABNORMAL HIGH (ref 70–99)
Potassium: 3.7 mmol/L (ref 3.5–5.1)
Sodium: 136 mmol/L (ref 135–145)
Total Bilirubin: 0.6 mg/dL (ref 0.3–1.2)
Total Protein: 7.2 g/dL (ref 6.5–8.1)

## 2021-05-05 LAB — CBC WITH DIFFERENTIAL/PLATELET
Abs Immature Granulocytes: 0.04 10*3/uL (ref 0.00–0.07)
Basophils Absolute: 0 10*3/uL (ref 0.0–0.1)
Basophils Relative: 0 %
Eosinophils Absolute: 0 10*3/uL (ref 0.0–0.5)
Eosinophils Relative: 0 %
HCT: 35.6 % — ABNORMAL LOW (ref 36.0–46.0)
Hemoglobin: 11.3 g/dL — ABNORMAL LOW (ref 12.0–15.0)
Immature Granulocytes: 0 %
Lymphocytes Relative: 11 %
Lymphs Abs: 1.1 10*3/uL (ref 0.7–4.0)
MCH: 26.4 pg (ref 26.0–34.0)
MCHC: 31.7 g/dL (ref 30.0–36.0)
MCV: 83.2 fL (ref 80.0–100.0)
Monocytes Absolute: 1 10*3/uL (ref 0.1–1.0)
Monocytes Relative: 10 %
Neutro Abs: 8 10*3/uL — ABNORMAL HIGH (ref 1.7–7.7)
Neutrophils Relative %: 79 %
Platelets: 302 10*3/uL (ref 150–400)
RBC: 4.28 MIL/uL (ref 3.87–5.11)
RDW: 17 % — ABNORMAL HIGH (ref 11.5–15.5)
WBC: 10.2 10*3/uL (ref 4.0–10.5)
nRBC: 0 % (ref 0.0–0.2)

## 2021-05-05 MED ORDER — SODIUM CHLORIDE 0.9 % IV SOLN
1.0000 g | Freq: Once | INTRAVENOUS | Status: AC
  Administered 2021-05-05: 1 g via INTRAVENOUS
  Filled 2021-05-05: qty 10

## 2021-05-05 MED ORDER — FOSFOMYCIN TROMETHAMINE 3 G PO PACK: 3.0000 g | PACK | Freq: Once | ORAL | 0 refills | Status: AC

## 2021-05-05 NOTE — Social Work (Signed)
CSW met with Pt and son, [name] ay bedside. Pt and son requesting information for long-term care placement.  CSW provided list of long-term care facilities in both [place] and [place] counties as well as in-home services list as family is seeking to hire care during the long-term care placement process.  CSW also completed long-term care FL2 in an effort to facilitate process for family. All rsources provided to son in print upon d/c.

## 2021-05-05 NOTE — ED Triage Notes (Signed)
Pt from home via [facility] c/o frequent falling and generalized weakness x2 weeks getting worse. Fell yesterday due to weakness, no LOC or head trauma. Fell today, no head trauma or other injuries. Possible UTI, having incontinence. Denies pain, dysuria/sx, any other complaints. Hx a-fib. Stroke screen negative. 18ga RAC.  BP 154/65 HR 74 SpO2 94% RA Temp 98.7 CBG 137

## 2021-05-05 NOTE — Discharge Instructions (Addendum)
Please look through the list of options and call your PCP for list of nursing home options.  Please follow-up with your primary care doctor next week to be reevaluated to see if things are improving and to discuss other alternatives nursing home options. Please take fosfomycin again in 3 days as needed if you are still having UTI symptoms.  The first dose was given here in the ED

## 2021-05-05 NOTE — ED Provider Notes (Signed)
[facility] DEPT Provider Note   CSN: [id number] Arrival date & time: 05/05/21  0833     History Chief Complaint  Patient presents with  . Weakness    Nancy Hayes is a 87 y.o. female.  HPI  Patient presents with generalized weakness x2 weeks.  She has also been having urinary incontinence starting today, states that she did have some urinary tract symptoms a few weeks ago and had a urine culture done but was called with the results and was unable to understand the person speaking to her due to a [place] accent.  She denies any other confusion episodes or difficulty understanding others since then.  She fell yesterday, witnessed did not hit her head or neck.  She had another fall earlier today where she fell try to get out of bed speaking with her son, she did not hit her head or neck.  The son had to help her ambulate.  The son and her both deny any changes in her behavior or mentation.  She is at her baseline.  Patient son is now in the room, patient's son states that she has been increasingly unsteady on her feet.  She has had multiple falls in the last couple days, she was recently seen at [facility] urology and had a culture of her urine done.  She takes Keflex to 50 mg daily as preventative for UTIs.  She was recently on ciprofloxacin 2 weeks ago for UTI.  Past Medical History:  Diagnosis Date  . AC (acromioclavicular) joint bone spurs    neck  . Arthritis   . Atrial fibrillation ([place])   . Dizzy spells   . Dysrhythmia   . Gout   . History of kidney stones   . Hypertension   . Macular degeneration of both eyes    right eye  . Renal disorder    pt denies  . UTI (lower urinary tract infection)   . Vertigo   . Vision abnormalities     Patient Active Problem List   Diagnosis Date Noted  . Generalized weakness   . Paroxysmal atrial fibrillation (HCC)   . Tachycardia-bradycardia syndrome ([place])   . Hypokalemia 04/08/2017  . Sepsis ([place])  04/06/2017  . Essential hypertension 11/23/2015  . PACs (premature atrial contraction)  with trigeminy 06/29/2015    Past Surgical History:  Procedure Laterality Date  . BALLOON DILATION N/A 12/05/2017   Procedure: BALLOON DILATION;  Surgeon: Carol Ada, MD;  Location: [facility];  Service: Endoscopy;  Laterality: N/A;  . CATARACT EXTRACTION    . ESOPHAGOGASTRODUODENOSCOPY (EGD) WITH PROPOFOL N/A 12/05/2017   Procedure: ESOPHAGOGASTRODUODENOSCOPY (EGD) WITH PROPOFOL;  Surgeon: Carol Ada, MD;  Location: [facility] ENDOSCOPY;  Service: Endoscopy;  Laterality: N/A;  . EXTRACORPOREAL SHOCK WAVE LITHOTRIPSY Right 05/26/2017   Procedure: EXTRACORPOREAL SHOCK WAVE LITHOTRIPSY (ESWL);  Surgeon: Nickie Retort, MD;  Location: [facility] ORS;  Service: Urology;  Laterality: Right;  . PARTIAL HYSTERECTOMY     pt states total hysterectomy     OB History   No obstetric history on file.     Family History  Problem Relation Age of Onset  . Hypertension Mother   . Stroke Sister     Social History   Tobacco Use  . Smoking status: Never  . Smokeless tobacco: Never  Vaping Use  . Vaping Use: Never used  Substance Use Topics  . Alcohol use: No  . Drug use: No    Home Medications Prior to Admission medications  Medication Sig Start Date End Date Taking? Authorizing Provider  amLODipine (NORVASC) 10 MG tablet TAKE 1 TABLET BY MOUTH  DAILY 12/18/20   Croitoru, Mihai, MD  Calcium Carbonate-Vitamin D (CALCIUM-D PO) Take 1 tablet by mouth daily.     [provider]  cephALEXin (KEFLEX) 500 MG capsule Take 1,000 mg by mouth 2 (two) times daily.  06/30/20   [provider]  Cranberry 500 MG CAPS Take 500 mg by mouth 2 (two) times daily.     [provider]  docusate sodium (COLACE) 250 MG capsule Take 250 mg by mouth daily.    [provider]  ferrous sulfate 325 (65 FE) MG tablet Take 1 tablet (325 mg total) by mouth daily. 07/19/20   Idol, Almyra Free, PA-C   hydrochlorothiazide (MICROZIDE) 12.5 MG capsule TAKE 1 CAPSULE BY MOUTH 4  TIMES A WEEK ON TUESDAYS,  THURSDAYS, SATURDAYS, AND  SUNDAYS 12/18/20   Croitoru, Mihai, MD  LACTOBACILLUS BIFIDUS PO Take 1 capsule by mouth at bedtime.     [provider]  methenamine (HIPREX) 1 g tablet Take 1 g by mouth 2 (two) times daily. Patient not taking: Reported on 08/03/2020 09/03/18   [provider]  metoprolol succinate (TOPROL-XL) 50 MG 24 hr tablet TAKE 1 TABLET BY MOUTH  DAILY 12/18/20   Croitoru, Mihai, MD  Omega-3 Fatty Acids (FISH OIL) 1000 MG CAPS Take 1,000 mg by mouth daily.    [provider]  omeprazole (PRILOSEC) 20 MG capsule Take 20 mg by mouth daily. 06/19/20   [provider]  polyvinyl alcohol (ARTIFICIAL TEARS) 1.4 % ophthalmic solution Place 1 drop into both eyes 2 (two) times daily.    [provider]  vitamin C (ASCORBIC ACID) 500 MG tablet Take 500 mg by mouth 2 (two) times daily.    [provider]  XARELTO 20 MG TABS tablet TAKE 1 TABLET BY MOUTH  DAILY WITH SUPPER 10/02/20   Croitoru, Dani Gobble, MD    Allergies    Patient has no known allergies.  Review of Systems   Review of Systems  Constitutional:  Negative for fatigue and fever.  Respiratory:  Negative for shortness of breath.   Cardiovascular:  Negative for chest pain.  Gastrointestinal:  Negative for abdominal pain, diarrhea and vomiting.  Genitourinary:  Positive for difficulty urinating.  Musculoskeletal:  Positive for gait problem. Negative for back pain.  Neurological:  Positive for weakness.   Physical Exam Updated Vital Signs BP (!) 164/71 (BP Location: Left Arm)   Pulse 72   Temp 97.8 F (36.6 C) (Oral)   Resp 20   Ht '5\' 4"'$  (1.626 m)   Wt 65.8 kg   BMI 24.89 kg/m   Physical Exam Vitals and nursing note reviewed. Exam conducted with a chaperone present.  Constitutional:      Appearance: Normal appearance.  HENT:     Head: Normocephalic and atraumatic.   Eyes:     General: No scleral icterus.       Right eye: No discharge.        Left eye: No discharge.     Extraocular Movements: Extraocular movements intact.     Pupils: Pupils are equal, round, and reactive to light.  Cardiovascular:     Rate and Rhythm: Normal rate and regular rhythm.     Pulses: Normal pulses.     Heart sounds: Normal heart sounds. No murmur heard.   No friction rub. No gallop.  Pulmonary:     Effort:  Pulmonary effort is normal. No respiratory distress.     Breath sounds: Normal breath sounds.  Abdominal:     General: Abdomen is flat. Bowel sounds are normal. There is no distension.     Palpations: Abdomen is soft.     Tenderness: There is no abdominal tenderness.  Skin:    General: Skin is warm and dry.     Coloration: Skin is not jaundiced.  Neurological:     Mental Status: She is alert. Mental status is at baseline.     Coordination: Coordination normal.     Comments: Cranial nerves III through XII are grossly intact, patient scription's equal bilaterally.  Patient is able to raise both of her legs.    ED Results / Procedures / Treatments   Labs (all labs ordered are listed, but only abnormal results are displayed) Labs Reviewed - No data to display  EKG None  Radiology No results found.  Procedures Procedures   Medications Ordered in ED Medications - No data to display  ED Course  I have reviewed the triage vital signs and the nursing notes.  Pertinent labs & imaging results that were available during my care of the patient were reviewed by me and considered in my medical decision making (see chart for details).  Clinical Course as of 05/05/21 1433  Sat May 05, 2021  1113 CBC with Differential(!) Mild anemia, no leukocytosis. [HS]  1113 Comprehensive metabolic panel(!) No electrolyte derangement, no AKI, no anion gap [HS]    Clinical Course User Index [HS] Sherrill Raring, PA-C   MDM Rules/Calculators/A&P                            Patient is hypertensive, otherwise vitals are stable.  No focal deficits on neuro exam, no abdominal tenderness.  She has no leukocytosis consistent with [place], mild anemia but not consistent with fatigue.  There is no gross electrolyte derangement on CMP, urine is notable for urinary tract infection.  We will give her a dose of Rocephin here in the ED setting.  The son is concerned about taking care of her at home.  Patient is 30 and lives at home with him, she has been having increasingly more frequent falls and he is less and less able to take care of her.  They have not started looking at nursing homes but appear open to that.  I will put in a consult to social work to evaluate for appropriateness of home health versus SNF placement.  We will also speak to hospitalist and see if she is appropriate for observation for the UTI given that she is a fall risk at home.  Patient had Pseudomonas UTI in December.  Will need Pseudomonas coverage and antibiotic outpatient choice.  Consult with pharmacy regarding antibiotic choice.  They recommended giving the patient a dose of fosfomycin while here and having her pleated in 3 days.  I will do as they advised.  Patient does not meet inpatient criteria for admission.  She is eating and drinking okay, mentating appropriately.  I will touch base with social work to see about SNF placement.  Social work came and saw the patient.  She does not meet criteria for SNF placement.  She was given multiple resources for nursing home placement, patient is appropriate discharge at this time.  We advised her to take fosfomycin again in 3 days, she is also advised to follow-up with her PCP later this week  regarding nursing home placement options.  Final Clinical Impression(s) / ED Diagnoses Final diagnoses:  None    Rx / DC Orders ED Discharge Orders     None        Sherrill Raring, PA-C 05/05/21 [place], DO 05/05/21 1827

## 2021-05-05 NOTE — [place] FL2 (Signed)
[place] MEDICAID FL2 LEVEL OF CARE SCREENING TOOL     IDENTIFICATION  Patient Name: Nancy Hayes Birthdate: 26-Jun-1927 Sex: female Admission Date (Current Location): 05/05/2021  [facility] and [place] Number:  Herbalist and Address:  [facility],  [place], [place]      Provider Number: [id number]  Attending Physician Name and Address:  Jeanell Sparrow, DO  Relative Name and Phone Number:  Mabeline, Hingson [phone]  [phone]    Current Level of Care: Hospital Recommended Level of Care: Nursing Facility Prior Approval Number:    Date Approved/Denied:   PASRR Number:    Discharge Plan: Home    Current Diagnoses: Patient Active Problem List   Diagnosis Date Noted   Generalized weakness    Paroxysmal atrial fibrillation (HCC)    Tachycardia-bradycardia syndrome ([place])    Hypokalemia 04/08/2017   Sepsis ([place]) 04/06/2017   Essential hypertension 11/23/2015   PACs (premature atrial contraction)  with trigeminy 06/29/2015    Orientation RESPIRATION BLADDER Height & Weight     Self, Time, Situation, Place  Normal Continent Weight: 145 lb (65.8 kg) Height:  '5\' 4"'$  (162.6 cm)  BEHAVIORAL SYMPTOMS/MOOD NEUROLOGICAL BOWEL NUTRITION STATUS      Continent Diet (regular)  AMBULATORY STATUS COMMUNICATION OF NEEDS Skin   Extensive Assist Verbally Normal                       Personal Care Assistance Level of Assistance  Bathing, Feeding, Dressing Bathing Assistance: Limited assistance Feeding assistance: Independent Dressing Assistance: Limited assistance     Functional Limitations Info  Sight, Hearing, Speech Sight Info: Adequate (wears glasses) Hearing Info: Adequate Speech Info: Adequate    SPECIAL CARE FACTORS FREQUENCY                       Contractures Contractures Info: Not present    Additional Factors Info  Code Status Code Status Info: DNR             Current Medications (05/05/2021):   This is the current hospital active medication list No current facility-administered medications for this encounter.   Current Outpatient Medications  Medication Sig Dispense Refill   amLODipine (NORVASC) 10 MG tablet TAKE 1 TABLET BY MOUTH  DAILY (Patient taking differently: Take 10 mg by mouth daily.) 90 tablet 2   Calcium Carbonate-Vitamin D (CALCIUM-D PO) Take 2 tablets by mouth daily.     Carboxymethylcellulose Sodium 0.25 % SOLN Apply 1 drop to eye 2 (two) times daily as needed (dry eyes).     cephALEXin (KEFLEX) 250 MG capsule Take 250 mg by mouth at bedtime.     Cranberry, Vacc oxycoccus, (CRANBERRY EXTRACT) 200 MG CAPS Take 200 mg by mouth 2 (two) times daily.     docusate sodium (COLACE) 100 MG capsule Take 100 mg by mouth daily at 4 PM.     fosfomycin (MONUROL) 3 g PACK Take 3 g by mouth once for 1 dose. 3 g 0   GARLIC PO Take 1 Dose by mouth daily.     hydrochlorothiazide (MICROZIDE) 12.5 MG capsule TAKE 1 CAPSULE BY MOUTH 4  TIMES A WEEK ON TUESDAYS,  THURSDAYS, SATURDAYS, AND  SUNDAYS (Patient taking differently: Take 12.5 mg by mouth 2 (two) times a week. On Monday and Friday) 52 capsule 2   LACTOBACILLUS BIFIDUS PO Take 1 capsule by mouth every evening.     metoprolol succinate (TOPROL-XL) 50 MG  24 hr tablet TAKE 1 TABLET BY MOUTH  DAILY (Patient taking differently: Take 50 mg by mouth daily.) 90 tablet 2   Multiple Vitamins-Minerals (PRESERVISION AREDS 2+MULTI VIT PO) Take 1 tablet by mouth 2 (two) times daily.     MYRBETRIQ 50 MG TB24 tablet Take 50 mg by mouth daily.     Omega-3 Fatty Acids (FISH OIL) 1000 MG CAPS Take 1,000 mg by mouth daily.     omeprazole (PRILOSEC) 40 MG capsule Take 40 mg by mouth every evening.     XARELTO 20 MG TABS tablet TAKE 1 TABLET BY MOUTH  DAILY WITH SUPPER (Patient taking differently: Take 20 mg by mouth daily with supper.) 90 tablet 3   ferrous sulfate 325 (65 FE) MG tablet Take 1 tablet (325 mg total) by mouth daily. (Patient not taking: No  sig reported) 30 tablet 0     Discharge Medications: Please see discharge summary for a list of discharge medications.  Relevant Imaging Results:  Relevant Lab Results:   Additional Information SSN# [id number] has had 2 Covid vaccines and 2 boosters  [place], LCSW

## 2021-05-06 ENCOUNTER — Telehealth: Payer: Self-pay

## 2021-05-06 LAB — URINE CULTURE

## 2021-05-06 NOTE — Telephone Encounter (Signed)
Patient in the ED yesterday with UTI, having frequent falls at home, son is having a harder time taking care of her. Placement V HH. No orders were written for home health, called son [name] and left a message to call back regarding needing to see PCP for [facility] and to start FL2 if they are considering placement options.

## 2021-05-08 ENCOUNTER — Ambulatory Visit: Payer: Medicare Other | Admitting: Physical Therapy

## 2021-05-30 ENCOUNTER — Other Ambulatory Visit: Payer: Self-pay

## 2021-05-30 ENCOUNTER — Ambulatory Visit (INDEPENDENT_AMBULATORY_CARE_PROVIDER_SITE_OTHER): Payer: Medicare Other | Admitting: Physician Assistant

## 2021-05-30 ENCOUNTER — Encounter: Payer: Self-pay | Admitting: Physician Assistant

## 2021-05-30 VITALS — BP 134/67 | HR 68 | Ht 64.0 in | Wt 145.0 lb

## 2021-05-30 DIAGNOSIS — M25472 Effusion, left ankle: Secondary | ICD-10-CM | POA: Diagnosis not present

## 2021-05-30 DIAGNOSIS — R0602 Shortness of breath: Secondary | ICD-10-CM | POA: Diagnosis not present

## 2021-05-30 DIAGNOSIS — I10 Essential (primary) hypertension: Secondary | ICD-10-CM | POA: Diagnosis not present

## 2021-05-30 DIAGNOSIS — I480 Paroxysmal atrial fibrillation: Secondary | ICD-10-CM

## 2021-05-30 MED ORDER — HYDROCHLOROTHIAZIDE 12.5 MG PO CAPS
12.5000 mg | ORAL_CAPSULE | ORAL | 3 refills | Status: DC
Start: 2021-05-31 — End: 2022-03-06

## 2021-05-30 MED ORDER — AMLODIPINE BESYLATE 10 MG PO TABS: 10.0000 mg | ORAL_TABLET | Freq: Every day | ORAL | 3 refills | Status: DC

## 2021-05-30 MED ORDER — METOPROLOL SUCCINATE ER 50 MG PO TB24: 50.0000 mg | ORAL_TABLET | Freq: Every day | ORAL | 3 refills | Status: DC

## 2021-05-30 MED ORDER — RIVAROXABAN 20 MG PO TABS
20.0000 mg | ORAL_TABLET | Freq: Every day | ORAL | 3 refills | Status: DC
Start: 2021-05-30 — End: 2022-01-03

## 2021-05-30 NOTE — Patient Instructions (Addendum)
Medication Instructions:  Your physician recommends that you continue on your current medications as directed. Please refer to the Current Medication list given to you today.   *If you need a refill on your cardiac medications before your next appointment, please call your pharmacy*   Lab Work: NONE ordered at this time of appointment   If you have labs (blood work) drawn today and your tests are completely normal, you will receive your results only by: [place] (if you have MyChart) OR A paper copy in the mail If you have any lab test that is abnormal or we need to change your treatment, we will call you to review the results.   Testing/Procedures: Your physician has requested that you have an echocardiogram. Echocardiography is a painless test that uses sound waves to create images of your heart. It provides your doctor with information about the size and shape of your heart and how well your heart's chambers and valves are working. This procedure takes approximately one hour. There are no restrictions for this procedure.   PLEASE SCHEDULE FOR 2-4 WEEKS  Follow-Up: At [facility], you and your health needs are our priority.  As part of our continuing mission to provide you with exceptional heart care, we have created designated Provider Care Teams.  These Care Teams include your primary Cardiologist (physician) and Advanced Practice Providers (APPs -  Physician Assistants and Nurse Practitioners) who all work together to provide you with the care you need, when you need it.  We recommend signing up for the patient portal called "MyChart".  Sign up information is provided on this After Visit Summary.  MyChart is used to connect with patients for Virtual Visits (Telemedicine).  Patients are able to view lab/test results, encounter notes, upcoming appointments, etc.  Non-urgent messages can be sent to your provider as well.   To learn more about what you can do with MyChart, go to  [email or web address].    Your next appointment:   6 month(s)  The format for your next appointment:   In Person  Provider:   Sanda Klein, MD   Other Instructions

## 2021-05-30 NOTE — Progress Notes (Signed)
Cardiology Office Note:    Date:  06/01/2021   ID:  MULAN ADAN, DOB Jan 24, 1927, MRN [id number]  PCP:  Aletha Halim., PA-C   [facility] Providers Cardiologist:  Sanda Klein, MD     Referring MD: Aletha Halim., PA-C   No chief complaint on file.   History of Present Illness:    Nancy Hayes is a 87 y.o. female with a hx of hypertension and atrial fibrillation on Xarelto.  Last echocardiogram obtained on 04/10/2017 showed EF 60 to 65%, normal wall motion, grade 2 DD, moderate MR, moderate LAE, moderate TR, PAP pressure 54 mmHg.  Patient was last seen by Dr. Sallyanne Kuster at which time she was doing well.  She did have a fall prior to the visit, however CT scan was negative for acute process.  HCTZ was reduced to twice a week due to low blood pressure and fall episode.  Patient was seen in the ED on 05/05/2021 due to generalized weakness and urinary incontinence.  CMP was normal, urinalysis notable for UTI.  She was treated with a dose of Rocephin in the emergency room.  Due to frequent fall, she was referred to skilled nursing facility.  Based on PCP note on 04/03/2021, patient has been noticing increasing dyspnea recently.  Patient is currently residing at [facility].  She denies any recent chest pain.  She twisted her left ankle recently and left ankle is clearly swollen compared to the right side.  Her son mentions that she already had a ankle x-ray came back negative.  She has bibasilar crackles on physical exam, last chest x-ray obtained earlier this month showed scars near the base of the lung.  Otherwise she appears to be euvolemic on exam.  Physical exam also revealed irregular heart rate suggestive of normal sinus rhythm.  She also had normal sinus rhythm earlier this month in the ED with lateral T wave inversion that is unchanged when compared to the previous EKG.  She says in the past year, her breathing is notably worse.  Since her fall and ankle injury, she has  not walked.  Suspicion for DVT very low despite the significant swelling since the patient is on Xarelto.   Past Medical History:  Diagnosis Date   AC (acromioclavicular) joint bone spurs    neck   Arthritis    Atrial fibrillation (HCC)    Dizzy spells    Dysrhythmia    Gout    History of kidney stones    Hypertension    Macular degeneration of both eyes    right eye   Renal disorder    pt denies   UTI (lower urinary tract infection)    Vertigo    Vision abnormalities     Past Surgical History:  Procedure Laterality Date   BALLOON DILATION N/A 12/05/2017   Procedure: BALLOON DILATION;  Surgeon: Carol Ada, MD;  Location: [facility];  Service: Endoscopy;  Laterality: N/A;   CATARACT EXTRACTION     ESOPHAGOGASTRODUODENOSCOPY (EGD) WITH PROPOFOL N/A 12/05/2017   Procedure: ESOPHAGOGASTRODUODENOSCOPY (EGD) WITH PROPOFOL;  Surgeon: Carol Ada, MD;  Location: [facility];  Service: Endoscopy;  Laterality: N/A;   EXTRACORPOREAL SHOCK WAVE LITHOTRIPSY Right 05/26/2017   Procedure: EXTRACORPOREAL SHOCK WAVE LITHOTRIPSY (ESWL);  Surgeon: Nickie Retort, MD;  Location: [facility];  Service: Urology;  Laterality: Right;   PARTIAL HYSTERECTOMY     pt states total hysterectomy    Current Medications: Current Meds  Medication Sig  Calcium Carbonate-Vitamin D (CALCIUM-D PO) Take 2 tablets by mouth daily.   Carboxymethylcellulose Sodium 0.25 % SOLN Apply 1 drop to eye 2 (two) times daily as needed (dry eyes).   cephALEXin (KEFLEX) 250 MG capsule Take 250 mg by mouth at bedtime.   Cranberry, Vacc oxycoccus, (CRANBERRY EXTRACT) 200 MG CAPS Take 200 mg by mouth 2 (two) times daily.   docusate sodium (COLACE) 100 MG capsule Take 100 mg by mouth daily at 4 PM.   GARLIC PO Take 1 Dose by mouth daily.   LACTOBACILLUS BIFIDUS PO Take 1 capsule by mouth every evening.   Multiple Vitamins-Minerals (PRESERVISION AREDS 2+MULTI VIT PO) Take 1 tablet by mouth 2 (two) times daily.   MYRBETRIQ 50  MG TB24 tablet Take 50 mg by mouth daily.   Omega-3 Fatty Acids (FISH OIL) 1000 MG CAPS Take 1,000 mg by mouth daily.   omeprazole (PRILOSEC) 40 MG capsule Take 40 mg by mouth every evening.   [DISCONTINUED] amLODipine (NORVASC) 10 MG tablet TAKE 1 TABLET BY MOUTH  DAILY (Patient taking differently: Take 10 mg by mouth daily.)   [DISCONTINUED] hydrochlorothiazide (MICROZIDE) 12.5 MG capsule TAKE 1 CAPSULE BY MOUTH 4  TIMES A WEEK ON TUESDAYS,  THURSDAYS, SATURDAYS, AND  SUNDAYS (Patient taking differently: Take 12.5 mg by mouth 2 (two) times a week. On Monday and Friday)   [DISCONTINUED] metoprolol succinate (TOPROL-XL) 50 MG 24 hr tablet TAKE 1 TABLET BY MOUTH  DAILY (Patient taking differently: Take 50 mg by mouth daily.)   [DISCONTINUED] XARELTO 20 MG TABS tablet TAKE 1 TABLET BY MOUTH  DAILY WITH SUPPER (Patient taking differently: Take 20 mg by mouth daily with supper.)     Allergies:   Patient has no known allergies.   Social History   Socioeconomic History   Marital status: Widowed    Spouse name: Not on file   Number of children: Not on file   Years of education: Not on file   Highest education level: Not on file  Occupational History   Not on file  Tobacco Use   Smoking status: Never   Smokeless tobacco: Never  Vaping Use   Vaping Use: Never used  Substance and Sexual Activity   Alcohol use: No   Drug use: No   Sexual activity: Not Currently  Other Topics Concern   Not on file  Social History Narrative   Not on file   Social Determinants of Health   Financial Resource Strain: Not on file  Food Insecurity: Not on file  Transportation Needs: Not on file  Physical Activity: Not on file  Stress: Not on file  Social Connections: Not on file     Family History: The patient's family history includes Hypertension in her mother; Stroke in her sister.  ROS:   Please see the history of present illness.     All other systems reviewed and are negative.  EKGs/Labs/Other  Studies Reviewed:    The following studies were reviewed today:  Echo 04/10/2017 LV EF: 60% -   65%  Study Conclusions   - Left ventricle: The cavity size was normal. There was moderate    focal basal and mild concentric hypertrophy of the remaining    myocardium. Systolic function was normal. The estimated ejection    fraction was in the range of 60% to 65%. Wall motion was normal;    there were no regional wall motion abnormalities. Features are    consistent with a pseudonormal left ventricular filling pattern,  with concomitant abnormal relaxation and increased filling    pressure (grade 2 diastolic dysfunction). Doppler parameters are    consistent with elevated ventricular end-diastolic filling    pressure.  - Aortic valve: Trileaflet; mildly thickened, mildly calcified    leaflets. Transvalvular velocity was within the normal range.    There was no stenosis.  - Mitral valve: Calcified annulus. Mildly thickened leaflets .    There was moderate regurgitation directed centrally. Valve area    by continuity equation (using LVOT flow): 1.93 cm^2.  - Left atrium: The atrium was moderately dilated.  - Right ventricle: Systolic function was normal.  - Right atrium: The atrium was mildly dilated.  - Tricuspid valve: There was moderate regurgitation.  - Pulmonary arteries: Systolic pressure was moderately increased.    PA peak pressure: 54 mm Hg (S).   EKG:  EKG is not ordered today.    Recent Labs: 05/05/2021: ALT 16; BUN 17; Creatinine, Ser 0.61; Hemoglobin 11.3; Platelets 302; Potassium 3.7; Sodium 136  Recent Lipid Panel No results found for: CHOL, TRIG, HDL, CHOLHDL, VLDL, LDLCALC, LDLDIRECT   Risk Assessment/Calculations:    CHA2DS2-VASc Score = 4   This indicates a 4.8% annual risk of stroke. The patient's score is based upon: CHF History: 0 HTN History: 1 Diabetes History: 0 Stroke History: 0 Vascular Disease History: 0 Age Score: 2 Gender Score: 1          Physical Exam:    VS:  BP 134/67   Pulse 68   Ht 5\' 4"  (1.626 m)   Wt 145 lb (65.8 kg)   SpO2 98%   BMI 24.89 kg/m     Wt Readings from Last 3 Encounters:  05/30/21 145 lb (65.8 kg)  05/05/21 145 lb (65.8 kg)  08/03/20 146 lb (66.2 kg)     GEN:  Well nourished, well developed in no acute distress HEENT: Normal NECK: No JVD; No carotid bruits LYMPHATICS: No lymphadenopathy CARDIAC: RRR, no murmurs, rubs, gallops RESPIRATORY:  Clear to auscultation without rales, wheezing or rhonchi  ABDOMEN: Soft, non-tender, non-distended MUSCULOSKELETAL:  No edema; No deformity  SKIN: Warm and dry NEUROLOGIC:  Alert and oriented x 3 PSYCHIATRIC:  Normal affect   ASSESSMENT:    1. SOB (shortness of breath)   2. PAF (paroxysmal atrial fibrillation) ([place])   3. Essential hypertension   4. Edema of left ankle    PLAN:    In order of problems listed above:  Shortness of breath: Patient has been noticing worsening dyspnea recently.  Her left ankle is swollen when compared to the right side, however suspicion for DVT fairly low since she is on Xarelto.  I recommended echocardiogram as initial assessment  PAF: Currently maintaining sinus rhythm.  On Xarelto  Hypertension: Blood pressure stable  Left ankle edema: Recent ankle sprain, according to her son, an x-ray was obtained which did not reveal any fracture.        Medication Adjustments/Labs and Tests Ordered: Current medicines are reviewed at length with the patient today.  Concerns regarding medicines are outlined above.  Orders Placed This Encounter  Procedures   ECHOCARDIOGRAM COMPLETE   Meds ordered this encounter  Medications   amLODipine (NORVASC) 10 MG tablet    Sig: Take 1 tablet (10 mg total) by mouth daily.    Dispense:  90 tablet    Refill:  3   hydrochlorothiazide (MICROZIDE) 12.5 MG capsule    Sig: Take 1 capsule (12.5 mg total) by mouth 2 (two)  times a week. On Monday and Friday    Dispense:  24 capsule     Refill:  3   metoprolol succinate (TOPROL-XL) 50 MG 24 hr tablet    Sig: Take 1 tablet (50 mg total) by mouth daily. Take with or immediately following a meal.    Dispense:  90 tablet    Refill:  3   rivaroxaban (XARELTO) 20 MG TABS tablet    Sig: Take 1 tablet (20 mg total) by mouth daily with supper.    Dispense:  90 tablet    Refill:  3    Patient Instructions  Medication Instructions:  Your physician recommends that you continue on your current medications as directed. Please refer to the Current Medication list given to you today.   *If you need a refill on your cardiac medications before your next appointment, please call your pharmacy*   Lab Work: NONE ordered at this time of appointment   If you have labs (blood work) drawn today and your tests are completely normal, you will receive your results only by: [place] (if you have MyChart) OR A paper copy in the mail If you have any lab test that is abnormal or we need to change your treatment, we will call you to review the results.   Testing/Procedures: Your physician has requested that you have an echocardiogram. Echocardiography is a painless test that uses sound waves to create images of your heart. It provides your doctor with information about the size and shape of your heart and how well your heart's chambers and valves are working. This procedure takes approximately one hour. There are no restrictions for this procedure.   PLEASE SCHEDULE FOR 2-4 WEEKS  Follow-Up: At [facility], you and your health needs are our priority.  As part of our continuing mission to provide you with exceptional heart care, we have created designated Provider Care Teams.  These Care Teams include your primary Cardiologist (physician) and Advanced Practice Providers (APPs -  Physician Assistants and Nurse Practitioners) who all work together to provide you with the care you need, when you need it.  We recommend signing up for the  patient portal called "MyChart".  Sign up information is provided on this After Visit Summary.  MyChart is used to connect with patients for Virtual Visits (Telemedicine).  Patients are able to view lab/test results, encounter notes, upcoming appointments, etc.  Non-urgent messages can be sent to your provider as well.   To learn more about what you can do with MyChart, go to [email or web address].    Your next appointment:   6 month(s)  The format for your next appointment:   In Person  Provider:   Sanda Klein, MD   Other Instructions    Signed, Almyra Deforest, [place]  06/01/2021 8:16 PM    [facility]

## 2021-06-20 ENCOUNTER — Other Ambulatory Visit (HOSPITAL_BASED_OUTPATIENT_CLINIC_OR_DEPARTMENT_OTHER): Payer: Medicare Other

## 2021-07-03 ENCOUNTER — Ambulatory Visit (INDEPENDENT_AMBULATORY_CARE_PROVIDER_SITE_OTHER): Payer: Medicare Other

## 2021-07-03 ENCOUNTER — Other Ambulatory Visit: Payer: Self-pay

## 2021-07-03 DIAGNOSIS — R0602 Shortness of breath: Secondary | ICD-10-CM | POA: Diagnosis not present

## 2021-07-03 LAB — ECHOCARDIOGRAM COMPLETE
Area-P 1/2: 2.38 cm2
MV VTI: 2.68 cm2
S' Lateral: 1.82 cm

## 2021-07-05 ENCOUNTER — Telehealth: Payer: Self-pay

## 2021-07-05 NOTE — Telephone Encounter (Addendum)
Left a voice message for the patient to give the office a call back for her recent echo results.  ----- Message from [place], [place] sent at 07/05/2021  7:46 AM EDT ----- Normal pumping function of heart, normal pressure in the right heart which argues against volume overload. Mild aortic valve leakage not unexpected with Nancy Hayes's advanced age. Nothing on this echo to explain her shortness of breath.

## 2021-07-06 ENCOUNTER — Telehealth: Payer: Self-pay | Admitting: Cardiovascular Disease

## 2021-07-06 NOTE — Telephone Encounter (Signed)
Pt returning phone call for results... please advise °

## 2021-07-06 NOTE — Telephone Encounter (Addendum)
Returned call to patients son (okay per DPR) made patient's son aware of patient's echo results. Advised him to call back with any issues, questions, or concerns and he verbalized understanding.    [place], [place]  07/05/2021  7:46 AM EDT     Normal pumping function of heart, normal pressure in the right heart which argues against volume overload. Mild aortic valve leakage not unexpected with Mrs. Sawchuk's advanced age. Nothing on this echo to explain her shortness of breath.

## 2021-08-30 ENCOUNTER — Emergency Department (HOSPITAL_COMMUNITY)
Admission: EM | Admit: 2021-08-30 | Discharge: 2021-08-31 | Disposition: A | Discharge status: Skilled Nursing Facility | Payer: Medicare Other | Attending: Emergency Medicine | Admitting: Emergency Medicine

## 2021-08-30 ENCOUNTER — Encounter (HOSPITAL_COMMUNITY): Payer: Self-pay | Admitting: Emergency Medicine

## 2021-08-30 ENCOUNTER — Emergency Department (HOSPITAL_COMMUNITY): Payer: Medicare Other

## 2021-08-30 ENCOUNTER — Other Ambulatory Visit: Payer: Self-pay

## 2021-08-30 DIAGNOSIS — Z79899 Other long term (current) drug therapy: Secondary | ICD-10-CM | POA: Insufficient documentation

## 2021-08-30 DIAGNOSIS — W1839XA Other fall on same level, initial encounter: Secondary | ICD-10-CM | POA: Diagnosis not present

## 2021-08-30 DIAGNOSIS — Y92129 Unspecified place in nursing home as the place of occurrence of the external cause: Secondary | ICD-10-CM | POA: Insufficient documentation

## 2021-08-30 DIAGNOSIS — I10 Essential (primary) hypertension: Secondary | ICD-10-CM | POA: Diagnosis not present

## 2021-08-30 DIAGNOSIS — S0083XA Contusion of other part of head, initial encounter: Secondary | ICD-10-CM | POA: Insufficient documentation

## 2021-08-30 DIAGNOSIS — S0993XA Unspecified injury of face, initial encounter: Secondary | ICD-10-CM | POA: Diagnosis present
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
## 2021-08-30 NOTE — ED Provider Notes (Signed)
°  Provider Note MRN:  [id number]  Arrival date & time: 08/31/21    ED Course and Medical Decision Making  Assumed care from Dr. Sabra Heck at shift change.  Fall, head trauma, awaiting CT head.  At baseline cognitively, should be able to be discharged if CT is without acute intracranial findings.  Procedures  Final Clinical Impressions(s) / ED Diagnoses     ICD-10-CM   1. Traumatic hematoma of forehead, initial encounter  Q46.96EX       ED Discharge Orders     None         Discharge Instructions      You were evaluated in the Emergency Department and after careful evaluation, we did not find any emergent condition requiring admission or further testing in the hospital.  Your exam/testing today was overall reassuring.  CT scan did not show any emergencies.  Recommend cold compresses to the forehead to help with the swelling.  Please return to the Emergency Department if you experience any worsening of your condition.  Thank you for allowing [place] to be a part of your care.       Barth Kirks. Sedonia Small, [place] [email or web address]    Maudie Flakes, MD 08/31/21 [phone]

## 2021-08-30 NOTE — ED Notes (Signed)
Patient transported to x-ray. ?

## 2021-08-30 NOTE — ED Triage Notes (Signed)
Pt arrives via [facility] from [facility] after fall from wheelchair. Pt denies any pain at this time. Pt does have noted hematoma to right forehead.

## 2021-08-31 NOTE — Discharge Instructions (Signed)
You were evaluated in the Emergency Department and after careful evaluation, we did not find any emergent condition requiring admission or further testing in the hospital.  Your exam/testing today was overall reassuring.  CT scan did not show any emergencies.  Recommend cold compresses to the forehead to help with the swelling.  Please return to the Emergency Department if you experience any worsening of your condition.  Thank you for allowing [place] to be a part of your care.

## 2021-11-18 ENCOUNTER — Other Ambulatory Visit: Payer: Self-pay

## 2021-11-18 ENCOUNTER — Emergency Department (HOSPITAL_COMMUNITY): Payer: Medicare Other

## 2021-11-18 ENCOUNTER — Emergency Department (HOSPITAL_COMMUNITY)
Admission: EM | Admit: 2021-11-18 | Discharge: 2021-11-18 | Disposition: A | Discharge status: Skilled Nursing Facility | Payer: Medicare Other | Attending: Emergency Medicine | Admitting: Emergency Medicine

## 2021-11-18 ENCOUNTER — Encounter (HOSPITAL_COMMUNITY): Payer: Self-pay

## 2021-11-18 DIAGNOSIS — W19XXXA Unspecified fall, initial encounter: Secondary | ICD-10-CM

## 2021-11-18 DIAGNOSIS — S0083XA Contusion of other part of head, initial encounter: Secondary | ICD-10-CM | POA: Insufficient documentation

## 2021-11-18 DIAGNOSIS — W050XXA Fall from non-moving wheelchair, initial encounter: Secondary | ICD-10-CM | POA: Diagnosis not present

## 2021-11-18 DIAGNOSIS — Z20822 Contact with and (suspected) exposure to covid-19: Secondary | ICD-10-CM | POA: Insufficient documentation

## 2021-11-18 DIAGNOSIS — S0990XA Unspecified injury of head, initial encounter: Secondary | ICD-10-CM | POA: Diagnosis present

## 2021-11-18 DIAGNOSIS — N3000 Acute cystitis without hematuria: Secondary | ICD-10-CM | POA: Diagnosis not present

## 2021-11-18 DIAGNOSIS — I480 Paroxysmal atrial fibrillation: Secondary | ICD-10-CM | POA: Diagnosis not present

## 2021-11-18 DIAGNOSIS — I10 Essential (primary) hypertension: Secondary | ICD-10-CM | POA: Diagnosis not present

## 2021-11-18 DIAGNOSIS — Z7901 Long term (current) use of anticoagulants: Secondary | ICD-10-CM | POA: Diagnosis not present

## 2021-11-18 DIAGNOSIS — Z79899 Other long term (current) drug therapy: Secondary | ICD-10-CM | POA: Insufficient documentation

## 2021-11-18 LAB — CBC
HCT: 31.9 % — ABNORMAL LOW (ref 36.0–46.0)
Hemoglobin: 9.8 g/dL — ABNORMAL LOW (ref 12.0–15.0)
MCH: 25.8 pg — ABNORMAL LOW (ref 26.0–34.0)
MCHC: 30.7 g/dL (ref 30.0–36.0)
MCV: 83.9 fL (ref 80.0–100.0)
Platelets: 368 10*3/uL (ref 150–400)
RBC: 3.8 MIL/uL — ABNORMAL LOW (ref 3.87–5.11)
RDW: 18.1 % — ABNORMAL HIGH (ref 11.5–15.5)
WBC: 7.4 10*3/uL (ref 4.0–10.5)
nRBC: 0 % (ref 0.0–0.2)

## 2021-11-18 LAB — URINALYSIS, ROUTINE W REFLEX MICROSCOPIC
Bilirubin Urine: NEGATIVE
Glucose, UA: NEGATIVE mg/dL
Hgb urine dipstick: NEGATIVE
Ketones, ur: 5 mg/dL — AB
Nitrite: POSITIVE — AB
Protein, ur: NEGATIVE mg/dL
Specific Gravity, Urine: 1.012 (ref 1.005–1.030)
WBC, UA: >50 WBC/hpf — ABNORMAL HIGH (ref 0–5)
pH: 7 (ref 5.0–8.0)

## 2021-11-18 LAB — COMPREHENSIVE METABOLIC PANEL
ALT: 15 U/L (ref 0–44)
AST: 21 U/L (ref 15–41)
Albumin: 3 g/dL — ABNORMAL LOW (ref 3.5–5.0)
Alkaline Phosphatase: 101 U/L (ref 38–126)
Anion gap: 9 (ref 5–15)
BUN: 10 mg/dL (ref 8–23)
CO2: 27 mmol/L (ref 22–32)
Calcium: 9.3 mg/dL (ref 8.9–10.3)
Chloride: 101 mmol/L (ref 98–111)
Creatinine, Ser: 0.46 mg/dL (ref 0.44–1.00)
GFR, Estimated: >60 mL/min (ref >60)
Glucose, Bld: 114 mg/dL — ABNORMAL HIGH (ref 70–99)
Potassium: 3.5 mmol/L (ref 3.5–5.1)
Sodium: 137 mmol/L (ref 135–145)
Total Bilirubin: 0.5 mg/dL (ref 0.3–1.2)
Total Protein: 6.8 g/dL (ref 6.5–8.1)

## 2021-11-18 LAB — MAGNESIUM: Magnesium: 2.2 mg/dL (ref 1.7–2.4)

## 2021-11-18 LAB — TROPONIN I (HIGH SENSITIVITY)
Troponin I (High Sensitivity): 6 ng/L (ref <18)
Troponin I (High Sensitivity): 5 ng/L (ref <18)

## 2021-11-18 LAB — PROTIME-INR
INR: 2.2 — ABNORMAL HIGH (ref 0.8–1.2)
Prothrombin Time: 24 seconds — ABNORMAL HIGH (ref 11.4–15.2)

## 2021-11-18 LAB — RESP PANEL BY RT-PCR (FLU A&B, COVID) ARPGX2
Influenza A by PCR: NEGATIVE
Influenza B by PCR: NEGATIVE
SARS Coronavirus 2 by RT PCR: NEGATIVE

## 2021-11-18 MED ORDER — CEPHALEXIN 500 MG PO CAPS: 500.0000 mg | ORAL_CAPSULE | Freq: Two times a day (BID) | ORAL | 0 refills | Status: AC

## 2021-11-18 NOTE — ED Notes (Signed)
[facility] [employer] called to set up transportation at this time. 

## 2021-11-18 NOTE — ED Triage Notes (Signed)
Patient presents to ED via [facility] from [facility]. Patient stated that she experience dizziness and fell from her wheelchair. Pt has a bruising and hematoma to right forehead. Denies nausea and vomiting. No complaints of pain at present. ?

## 2021-11-18 NOTE — ED Provider Notes (Signed)
?[facility] ?Provider Note ? ? ?CSN: [id number] ?Arrival date & time: 11/18/21  0801 ? ?  ? ?History ? ?Chief Complaint  ?Patient presents with  ? Fall  ? ? ?Nancy Hayes is a 88 y.o. female. ? ? ?Fall ?Patient presents after a fall.  Currently, she resides in a skilled nursing facility due to poor mobility.  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  This morning, she was woken up at 6 AM.  She was placed in a wheelchair.  She states that she has chronic intermittent vertigo.  This morning she had an episode of vertigo that caused her to fall out of her wheelchair.  Patient fell striking the right side of her forehead.  She is on Xarelto.  Currently, she denies any physical complaints. ? ?  ? ?Home Medications ?Prior to Admission medications   ?Medication Sig Start Date End Date Taking? Authorizing Provider  ?cephALEXin (KEFLEX) 500 MG capsule Take 1 capsule (500 mg total) by mouth 2 (two) times daily for 5 days. 11/18/21 11/23/21 Yes Godfrey Pick, MD  ?amLODipine (NORVASC) 10 MG tablet Take 1 tablet (10 mg total) by mouth daily. 05/30/21   Almyra Deforest, [place]  ?Calcium Carbonate-Vitamin D (CALCIUM-D PO) Take 2 tablets by mouth daily.    [provider]  ?Carboxymethylcellulose Sodium 0.25 % SOLN Apply 1 drop to eye 2 (two) times daily as needed (dry eyes).    [provider]  ?cephALEXin (KEFLEX) 250 MG capsule Take 250 mg by mouth at bedtime. 04/04/21   [provider]  ?Cranberry, Vacc oxycoccus, (CRANBERRY EXTRACT) 200 MG CAPS Take 200 mg by mouth 2 (two) times daily.    [provider]  ?docusate sodium (COLACE) 100 MG capsule Take 100 mg by mouth daily at 4 PM.    [provider]  ?ferrous sulfate 325 (65 FE) MG tablet Take 1 tablet (325 mg total) by mouth daily. ?Patient not taking: Reported on 05/30/2021 07/19/20   Evalee Jefferson, PA-C  ?GARLIC PO Take 1 Dose by mouth daily.    [provider]  ?hydrochlorothiazide (MICROZIDE)  12.5 MG capsule Take 1 capsule (12.5 mg total) by mouth 2 (two) times a week. On Monday and Friday 05/31/21 05/31/22  Almyra Deforest, [place]  ?LACTOBACILLUS BIFIDUS PO Take 1 capsule by mouth every evening.    [provider]  ?metoprolol succinate (TOPROL-XL) 50 MG 24 hr tablet Take 1 tablet (50 mg total) by mouth daily. Take with or immediately following a meal. 05/30/21   Almyra Deforest, [place]  ?Multiple Vitamins-Minerals (PRESERVISION AREDS 2+MULTI VIT PO) Take 1 tablet by mouth 2 (two) times daily.    [provider]  ?MYRBETRIQ 50 MG TB24 tablet Take 50 mg by mouth daily. 05/01/21   [provider]  ?Omega-3 Fatty Acids (FISH OIL) 1000 MG CAPS Take 1,000 mg by mouth daily.    [provider]  ?omeprazole (PRILOSEC) 40 MG capsule Take 40 mg by mouth every evening. 04/17/21   [provider]  ?rivaroxaban (XARELTO) 20 MG TABS tablet Take 1 tablet (20 mg total) by mouth daily with supper. 05/30/21   Almyra Deforest, [place]  ?   ? ?Allergies    ?Patient has no known allergies.   ? ?Review of Systems   ?Review of Systems  ?Neurological:  Positive for dizziness (Resolved).  ?All other systems reviewed and are negative. ? ?Physical Exam ?Updated Vital Signs ?BP (!) 155/64   Pulse 84   Temp 97.9 ?  F (36.6 ?C) (Oral)   Resp 20   Ht '5\' 4"'$  (1.626 m)   Wt 54.6 kg   SpO2 97%   BMI 20.65 kg/m?  ?Physical Exam ?Vitals and nursing note reviewed.  ?Constitutional:   ?   General: She is not in acute distress. ?   Appearance: Normal appearance. She is well-developed and normal weight. She is not ill-appearing, toxic-appearing or diaphoretic.  ?HENT:  ?   Head: Normocephalic and atraumatic.  ?   Right Ear: External ear normal.  ?   Left Ear: External ear normal.  ?   Nose: Nose normal.  ?   Mouth/Throat:  ?   Mouth: Mucous membranes are moist.  ?   Pharynx: Oropharynx is clear.  ?Eyes:  ?   Extraocular Movements: Extraocular movements intact.  ?   Conjunctiva/sclera: Conjunctivae normal.  ?Cardiovascular:  ?    Rate and Rhythm: Normal rate and regular rhythm.  ?   Heart sounds: No murmur heard. ?Pulmonary:  ?   Effort: Pulmonary effort is normal. No respiratory distress.  ?   Breath sounds: Normal breath sounds. No wheezing or rales.  ?Chest:  ?   Chest wall: No tenderness.  ?Abdominal:  ?   Palpations: Abdomen is soft.  ?   Tenderness: There is no abdominal tenderness.  ?Musculoskeletal:     ?   General: No swelling or tenderness.  ?   Cervical back: Normal range of motion and neck supple. No rigidity or tenderness.  ?   Right lower leg: No edema.  ?   Left lower leg: No edema.  ?   Comments: Baseline contractures in lower extremities  ?Skin: ?   General: Skin is warm and dry.  ?   Capillary Refill: Capillary refill takes less than 2 seconds.  ?   Findings: Bruising (Small bruise to right forehead) present.  ?Neurological:  ?   General: No focal deficit present.  ?   Mental Status: She is alert and oriented to person, place, and time.  ?   Cranial Nerves: No cranial nerve deficit.  ?   Sensory: No sensory deficit.  ?   Motor: No weakness.  ?   Coordination: Coordination normal.  ?Psychiatric:     ?   Mood and Affect: Mood normal.     ?   Behavior: Behavior normal.     ?   Thought Content: Thought content normal.     ?   Judgment: Judgment normal.  ? ? ?ED Results / Procedures / Treatments   ?Labs ?(all labs ordered are listed, but only abnormal results are displayed) ?Labs Reviewed  ?COMPREHENSIVE METABOLIC PANEL - Abnormal; Notable for the following components:  ?    Result Value  ? Glucose, Bld 114 (*)   ? Albumin 3.0 (*)   ? All other components within normal limits  ?CBC - Abnormal; Notable for the following components:  ? RBC 3.80 (*)   ? Hemoglobin 9.8 (*)   ? HCT 31.9 (*)   ? MCH 25.8 (*)   ? RDW 18.1 (*)   ? All other components within normal limits  ?URINALYSIS, ROUTINE W REFLEX MICROSCOPIC - Abnormal; Notable for the following components:  ? APPearance CLOUDY (*)   ? Ketones, ur 5 (*)   ? Nitrite POSITIVE (*)    ? Leukocytes,Ua LARGE (*)   ? WBC, UA >50 (*)   ? Bacteria, UA MANY (*)   ? All other components within normal limits  ?PROTIME-INR -  Abnormal; Notable for the following components:  ? Prothrombin Time 24.0 (*)   ? INR 2.2 (*)   ? All other components within normal limits  ?RESP PANEL BY RT-PCR (FLU A&B, COVID) ARPGX2  ?MAGNESIUM  ?TROPONIN I (HIGH SENSITIVITY)  ?TROPONIN I (HIGH SENSITIVITY)  ? ? ?EKG ?EKG Interpretation ? ?Date/Time:  Sunday November 18 2021 08:19:19 EDT ?Ventricular Rate:  74 ?PR Interval:  148 ?QRS Duration: 85 ?QT Interval:  530 ?QTC Calculation: 589 ?R Axis:   -40 ?Text Interpretation: Sinus rhythm Atrial premature complex Left axis deviation Probable anterior infarct, age indeterminate Prolonged QT interval Confirmed by Godfrey Pick [phone]) on 11/18/2021 10:18:43 AM ? ?Radiology ?DG Chest 1 View ? ?Result Date: 11/18/2021 ?CLINICAL DATA:  Dizziness.  Fell from wheelchair. EXAM: CHEST  1 VIEW COMPARISON:  Chest two views 05/05/2021 FINDINGS: Cardiac silhouette is again mildly enlarged. High-grade calcification is again seen within the aortic arch. Lungs are clear. Chronic left costophrenic angle blunting, likely scarring. No pleural effusion or pneumothorax. Mild dextrocurvature of the mid to lower thoracic spine. IMPRESSION: No significant change from prior.  No acute lung process. Electronically Signed   By: Yvonne Kendall M.D.   On: 11/18/2021 09:39  ? ?DG Pelvis 1-2 Views ? ?Result Date: 11/18/2021 ?CLINICAL DATA:  [place] from wheelchair. EXAM: PELVIS - 1-2 VIEW COMPARISON:  AP pelvis 06/28/2020 FINDINGS: Single oblique view of the pelvis with legs extending to the right. Reportedly the patient was unable to straighten legs. This limits evaluation of the bilateral femurs especially of the left femoral neck. There is mild curvilinear lucency overlying the left femoral neck, however this may extend outside of bone into the overlying soft tissues and therefore not represent a bone abnormality.  Otherwise, no definite acute fracture. Mild bilateral femoroacetabular joint space narrowing. Mild bilateral sacroiliac subchondral sclerosis degenerative change. Mild pubic symphysis osteoarthritis. Mild vascular calcifi

## 2021-11-18 NOTE — Discharge Instructions (Signed)
Your urine showed evidence of infection.  There is a prescription for an antibiotic attached.  Take as prescribed for the next 5 days.  ?

## 2022-01-01 ENCOUNTER — Telehealth: Payer: Self-pay | Admitting: Neurology

## 2022-01-01 ENCOUNTER — Ambulatory Visit (INDEPENDENT_AMBULATORY_CARE_PROVIDER_SITE_OTHER): Payer: Medicare Other | Admitting: Neurology

## 2022-01-01 ENCOUNTER — Encounter: Payer: Self-pay | Admitting: Neurology

## 2022-01-01 VITALS — BP 127/61 | HR 75

## 2022-01-01 DIAGNOSIS — R29898 Other symptoms and signs involving the musculoskeletal system: Secondary | ICD-10-CM | POA: Diagnosis not present

## 2022-01-01 DIAGNOSIS — R269 Unspecified abnormalities of gait and mobility: Secondary | ICD-10-CM

## 2022-01-01 DIAGNOSIS — M4803 Spinal stenosis, cervicothoracic region: Secondary | ICD-10-CM | POA: Diagnosis not present

## 2022-01-01 MED ORDER — GABAPENTIN 100 MG PO CAPS: 100.0000 mg | ORAL_CAPSULE | Freq: Two times a day (BID) | ORAL | 3 refills | Status: DC

## 2022-01-01 NOTE — Progress Notes (Signed)
?[facility] ?[address] ?[place] [address] ?[phone] ? ?     OFFICE CONSULT NOTE ? ?[name] ?Date of Birth:  1927/03/05 ?Medical Record Number:  [id number]  ? ?Referring MD:  Bing Matter PA-c ? ?Reason for Referral:  leg weakness ? ?HPI: Nancy Hayes is a 88 year old pleasant Caucasian lady seen today for initial office consultation visit.  She is accompanied by her son.  History is obtained from them and review of electronic medical records and referral notes.  She has past medical history of hypertension, gout, arthritis, atrial fibrillation, chronic dizziness, vertigo and vision difficulties.  Patient states she moved into assisted living in September 2022 since then she has had progressive walking difficulties.  Previously she was able to walk with a walker when she first got there but was several months she has basically had trouble moving the left leg which seems to have been flexed at the hip and rotated medially and she is unable to straighten it.  She has been working with therapy but has not been able to walk.  She denies significant back pain, radicular pain.  She does have numbness in her feet and complains of restlessness but is currently not on any medication for restless leg syndrome.  She has known history of cervical spinal stenosis and had a CT scan of C-spine done on 11/18/2021 which shows severe canal stenosis most severe at C3-4.  CT brain showed age-related changes of atrophy no acute abnormalities.  Patient denies any recent fall, neck or back injury.  She has seen me in the past in 20 17-18 for chronic dizziness which she has had an off-and-on for 30+ years which was felt to be multifactorial due to combination of spinal stenosis, peripheral vestibular dysfunction and mild peripheral neuropathy.  She was last seen on 09/17/2016.  She states her dizziness and vertigo stable and not currently bothersome.  She denies any numbness or pain in the hands  though she complains of mild weakness of handgrip bilaterally. ? ?ROS:   ?14 system review of systems is positive for walking difficulty, leg weakness, dizziness, decreased hearing all other systems negative ? ?PMH:  ?Past Medical History:  ?Diagnosis Date  ? AC (acromioclavicular) joint bone spurs   ? neck  ? Arthritis   ? Atrial fibrillation ([place])   ? Dizzy spells   ? Dysrhythmia   ? Gout   ? History of kidney stones   ? Hypertension   ? Macular degeneration of both eyes   ? right eye  ? Renal disorder   ? pt denies  ? UTI (lower urinary tract infection)   ? Vertigo   ? Vision abnormalities   ? ? ?Social History:  ?Social History  ? ?Socioeconomic History  ? Marital status: Widowed  ?  Spouse name: Not on file  ? Number of children: Not on file  ? Years of education: Not on file  ? Highest education level: Not on file  ?Occupational History  ? Not on file  ?Tobacco Use  ? Smoking status: Never  ? Smokeless tobacco: Never  ?Vaping Use  ? Vaping Use: Never used  ?Substance and Sexual Activity  ? Alcohol use: No  ? Drug use: No  ? Sexual activity: Not Currently  ?Other Topics Concern  ? Not on file  ?Social History Narrative  ? Not on file  ? ?Social Determinants of Health  ? ?Financial Resource Strain: Not on file  ?Food Insecurity: Not on  file  ?Transportation Needs: Not on file  ?Physical Activity: Not on file  ?Stress: Not on file  ?Social Connections: Not on file  ?Intimate Partner Violence: Not on file  ? ? ?Medications:   ?Current Outpatient Medications on File Prior to Visit  ?Medication Sig Dispense Refill  ? amLODipine (NORVASC) 10 MG tablet Take 1 tablet (10 mg total) by mouth daily. 90 tablet 3  ? Calcium Carbonate-Vitamin D (CALCIUM-D PO) Take 2 tablets by mouth daily.    ? Carboxymethylcellulose Sodium 0.25 % SOLN Apply 1 drop to eye 2 (two) times daily as needed (dry eyes).    ? cephALEXin (KEFLEX) 250 MG capsule Take 250 mg by mouth at bedtime.    ? Cranberry, Vacc oxycoccus, (CRANBERRY EXTRACT) 200  MG CAPS Take 200 mg by mouth 2 (two) times daily.    ? docusate sodium (COLACE) 100 MG capsule Take 100 mg by mouth daily at 4 PM.    ? GARLIC PO Take 1 Dose by mouth daily.    ? hydrochlorothiazide (MICROZIDE) 12.5 MG capsule Take 1 capsule (12.5 mg total) by mouth 2 (two) times a week. On Monday and Friday 24 capsule 3  ? LACTOBACILLUS BIFIDUS PO Take 1 capsule by mouth every evening.    ? metoprolol succinate (TOPROL-XL) 50 MG 24 hr tablet Take 1 tablet (50 mg total) by mouth daily. Take with or immediately following a meal. 90 tablet 3  ? Multiple Vitamins-Minerals (PRESERVISION AREDS 2+MULTI VIT PO) Take by mouth.    ? MYRBETRIQ 50 MG TB24 tablet Take 50 mg by mouth daily.    ? Omega-3 Fatty Acids (FISH OIL) 1000 MG CAPS Take 1,000 mg by mouth daily.    ? omeprazole (PRILOSEC) 40 MG capsule Take 40 mg by mouth every evening.    ? rivaroxaban (XARELTO) 20 MG TABS tablet Take 1 tablet (20 mg total) by mouth daily with supper. 90 tablet 3  ? ?No current facility-administered medications on file prior to visit.  ? ? ?Allergies:  No Known Allergies ? ?Physical Exam ?General: Frail elderly Caucasian lady, seated, in no evident distress ?Head: head normocephalic and atraumatic.   ?Neck: supple with no carotid or supraclavicular bruits ?Cardiovascular: regular rate and rhythm, no murmurs ?Musculoskeletal: Severe kyphoscoliosis.  Left leg is medially rotated at the hip and knee  ?skin:  no rash.  Scattered petichiae ?Vascular:  Normal pulses all extremities ? ?Neurologic Exam ?Mental Status: Awake and fully alert. Oriented to place and time. Recent and remote memory intact. Attention span, concentration and fund of knowledge appropriate. Mood and affect appropriate.  ?Cranial Nerves: Fundoscopic exam r not done s. Pupils equal, briskly reactive to light. Extraocular movements full without nystagmus. Visual fields full to confrontation. Hearing slightly diminished bilaterally.. Facial sensation intact. Face, tongue,  palate moves normally and symmetrically.  ?Motor: Normal strength in proximal bilateral upper extremity muscles at the shoulders and elbows.  Mild weakness of grip and intrinsic hand muscles bilaterally.  4/5 weakness of left hip flexors, adductor's and knee extensors.  Bilateral ankle dorsiflexor weakness. ?Sensory.: intact to touch , pinprick , position and vibratory sensation.  ?Coordination: Rapid alternating movements normal in all extremities. Finger-to-nose and heel-to-shin performed accurately bilaterally. ?Gait and Station: Unable to rise from wheelchair and unable to walk even with assistance  ?reflexes: 3+ on the left and 2+ on the right.  No clonus. Toes downgoing.  ? ?  ? ? ?ASSESSMENT: 88 year old Caucasian lady with subacute progressive leg weakness left greater than right  possibly from myelopathy from spinal stenosis from degenerative spine disease.  She also has mild underlying peripheral neuropathy and restless leg syndrome.  Longstanding history of intermittent dizziness likely multifactorial which appears to be stable ? ? ? ? ?PLAN: I had a long discussion with the patient and her son regarding her left leg weakness and difficulty walking for the last 6 months which likely is due to her progressive spinal stenosis and myelopathy with possible superimposed radiculopathy.  She also has mild underlying peripheral neuropathy and restless leg syndrome I recommend further evaluation by checking CT scan of the thoracic and lumbar spine as well as EMG nerve conduction study.  Physical therapy consult for gait training.  Return for follow-up in 3 months or call earlier if necessary. ?Greater than 50% time during this 45-minute consultation visit was spent on counseling and coordination of care about her gait difficulties and dizziness and answering questions. ?Antony Contras, MD ?Note: This document was prepared with digital dictation and possible smart phrase technology. Any transcriptional errors that  result from this process are unintentional. ? ? ?

## 2022-01-01 NOTE — Telephone Encounter (Signed)
Medicare/[facility] order sent to [facility] they will reach out to the patient to schedule.  ?

## 2022-01-01 NOTE — Patient Instructions (Addendum)
I had a long discussion with the patient and his son regarding her left leg weakness and difficulty walking for the last 6 months which likely is due to her progressive spinal stenosis and myelopathy with possible superimposed radiculopathy.  I recommend further evaluation by checking CT scan of the thoracic and lumbar spine as well as EMG nerve conduction study.  Physical therapy consult for gait training.  Gabapentin 100 mg twice daily for restless legs.Increase as tolerated.Return for follow-up in 3 months or call earlier if necessary. ?

## 2022-01-03 ENCOUNTER — Ambulatory Visit (INDEPENDENT_AMBULATORY_CARE_PROVIDER_SITE_OTHER): Payer: Medicare Other | Admitting: Cardiovascular Disease

## 2022-01-03 ENCOUNTER — Telehealth: Payer: Self-pay | Admitting: Neurology

## 2022-01-03 ENCOUNTER — Encounter: Payer: Self-pay | Admitting: Cardiovascular Disease

## 2022-01-03 VITALS — BP 126/74 | HR 70 | Ht 64.0 in | Wt 114.0 lb

## 2022-01-03 DIAGNOSIS — D6869 Other thrombophilia: Secondary | ICD-10-CM | POA: Diagnosis not present

## 2022-01-03 DIAGNOSIS — I480 Paroxysmal atrial fibrillation: Secondary | ICD-10-CM | POA: Diagnosis not present

## 2022-01-03 DIAGNOSIS — I10 Essential (primary) hypertension: Secondary | ICD-10-CM | POA: Diagnosis not present

## 2022-01-03 MED ORDER — APIXABAN 2.5 MG PO TABS: 2.5000 mg | ORAL_TABLET | Freq: Two times a day (BID) | ORAL | 11 refills | Status: DC

## 2022-01-03 MED ORDER — APIXABAN 2.5 MG PO TABS: 2.5000 mg | ORAL_TABLET | Freq: Two times a day (BID) | ORAL | 3 refills | Status: DC

## 2022-01-03 NOTE — Telephone Encounter (Signed)
Sent NCV/EMG order to [facility] [phone]. ?

## 2022-01-03 NOTE — Progress Notes (Signed)
? ?Cardiology Office Note  ? ?Date:  01/05/2022  ? ?ID:  Nancy Hayes, DOB 04-24-1927, MRN [id number] ? ?PCP:  Aletha Halim., PA-C  ?Cardiologist:  Sanda Klein, MD  ?Electrophysiologist:  None  ? ?Chief Complaint:  AFib follow up, HTN ? ?History of Present Illness:   ? ?Nancy Hayes is a 88 y.o. female with atrial fibrillation and HTN. ? ?She is here with her son [name]. ? ?He is now a resident at [facility] assisted living.  She had a fall last December complicated by a large left frontal hematoma.  She has had another fall even after moving into assisted living when she was left without assistance.  This happened in March.  On both occasions, CT of the head did not show any evidence of intracranial hemorrhage. ? ?Except for these falls, she's done quite well.  She does not have any cardiac complaints.The patient specifically denies any chest pain at rest exertion, dyspnea at rest or with exertion, orthopnea, paroxysmal nocturnal dyspnea, syncope, palpitations, focal neurological deficits, intermittent claudication, lower extremity edema, unexplained weight gain, cough, hemoptysis or wheezing. ? ?She has lost some more weight and her BMI is now just under 20.  He reports having a good appetite. ? ?Past Medical History:  ?Diagnosis Date  ? AC (acromioclavicular) joint bone spurs   ? neck  ? Arthritis   ? Atrial fibrillation ([place])   ? Dizzy spells   ? Dysrhythmia   ? Gout   ? History of kidney stones   ? Hypertension   ? Macular degeneration of both eyes   ? right eye  ? Renal disorder   ? pt denies  ? UTI (lower urinary tract infection)   ? Vertigo   ? Vision abnormalities   ? ?Past Surgical History:  ?Procedure Laterality Date  ? BALLOON DILATION N/A 12/05/2017  ? Procedure: BALLOON DILATION;  Surgeon: Carol Ada, MD;  Location: [facility];  Service: Endoscopy;  Laterality: N/A;  ? CATARACT EXTRACTION    ? ESOPHAGOGASTRODUODENOSCOPY (EGD) WITH PROPOFOL N/A 12/05/2017  ? Procedure:  ESOPHAGOGASTRODUODENOSCOPY (EGD) WITH PROPOFOL;  Surgeon: Carol Ada, MD;  Location: [facility];  Service: Endoscopy;  Laterality: N/A;  ? EXTRACORPOREAL SHOCK WAVE LITHOTRIPSY Right 05/26/2017  ? Procedure: EXTRACORPOREAL SHOCK WAVE LITHOTRIPSY (ESWL);  Surgeon: Nickie Retort, MD;  Location: [facility];  Service: Urology;  Laterality: Right;  ? PARTIAL HYSTERECTOMY    ? pt states total hysterectomy  ?  ? ?Current Meds  ?Medication Sig  ? amLODipine (NORVASC) 10 MG tablet Take 1 tablet (10 mg total) by mouth daily.  ? Calcium Carbonate-Vitamin D (CALCIUM-D PO) Take 2 tablets by mouth daily.  ? Carboxymethylcellulose Sodium 0.25 % SOLN Apply 1 drop to eye 2 (two) times daily as needed (dry eyes).  ? cephALEXin (KEFLEX) 250 MG capsule Take 250 mg by mouth at bedtime.  ? Cranberry, Vacc oxycoccus, (CRANBERRY EXTRACT) 200 MG CAPS Take 200 mg by mouth 2 (two) times daily.  ? cyanocobalamin 1000 MCG tablet Take 1 tablet by mouth daily.  ? docusate sodium (COLACE) 100 MG capsule Take 100 mg by mouth daily at 4 PM.  ? gabapentin (NEURONTIN) 100 MG capsule Take 1 capsule (100 mg total) by mouth 2 (two) times daily.  ? GARLIC PO Take 1 Dose by mouth daily.  ? hydrochlorothiazide (MICROZIDE) 12.5 MG capsule Take 1 capsule (12.5 mg total) by mouth 2 (two) times a week. On Monday and Friday  ? LACTOBACILLUS BIFIDUS  PO Take 1 capsule by mouth every evening.  ? metoprolol succinate (TOPROL-XL) 50 MG 24 hr tablet Take 1 tablet (50 mg total) by mouth daily. Take with or immediately following a meal.  ? Multiple Vitamins-Minerals (PRESERVISION AREDS 2+MULTI VIT PO) Take by mouth.  ? MYRBETRIQ 50 MG TB24 tablet Take 50 mg by mouth daily.  ? Omega-3 Fatty Acids (FISH OIL) 1000 MG CAPS Take 1,000 mg by mouth daily.  ? omeprazole (PRILOSEC) 40 MG capsule Take 40 mg by mouth every evening.  ? [DISCONTINUED] apixaban (ELIQUIS) 2.5 MG TABS tablet Take 1 tablet (2.5 mg total) by mouth 2 (two) times daily.  ? [DISCONTINUED] rivaroxaban  (XARELTO) 20 MG TABS tablet Take 1 tablet (20 mg total) by mouth daily with supper.  ?  ? ?Allergies:   Patient has no known allergies.  ? ?Social History  ? ?Tobacco Use  ? Smoking status: Never  ? Smokeless tobacco: Never  ?Vaping Use  ? Vaping Use: Never used  ?Substance Use Topics  ? Alcohol use: No  ? Drug use: No  ?  ? ?Family Hx: ?The patient's family history includes Hypertension in her mother; Stroke in her sister. ? ?ROS:   ?Please see the history of present illness.   All other systems are reviewed and are negative.  ? ?Prior CV studies:   ?The following studies were reviewed today: ? ?ED visit 10/28 and 07/19/2020 ? ?Labs/Other Tests and Data Reviewed:   ? ?EKG:  Reviewed the ECG from 11/18/2021 that shows sinus rhythm with PACs old left axis deviation and poor R wave progression almost meeting criteria for left anterior fascicular block, no acute ischemic changes. ? ?Recent Labs: ?11/18/2021: ALT 15; BUN 10; Creatinine, Ser 0.46; Hemoglobin 9.8; Magnesium 2.2; Platelets 368; Potassium 3.5; Sodium 137  ? ?Recent Lipid Panel ?No results found for: CHOL, TRIG, HDL, CHOLHDL, LDLCALC, LDLDIRECT ? ?Wt Readings from Last 3 Encounters:  ?01/03/22 114 lb (51.7 kg)  ?11/18/21 120 lb 4.8 oz (54.6 kg)  ?08/30/21 145 lb 1 oz (65.8 kg)  ?  ? ?Objective:   ? ?Vital Signs:  BP 126/74   Pulse 70   Ht '5\' 4"'$  (1.626 m)   Wt 114 lb (51.7 kg)   SpO2 95%   BMI 19.57 kg/m?   ? ? ? ?General: Alert, oriented x3, no distress, appears elderly and frail, has mild scoliosis ?Head: no evidence of trauma, PERRL, EOMI, no exophtalmos or lid lag, no myxedema, no xanthelasma; normal ears, nose and oropharynx ?Neck: normal jugular venous pulsations and no hepatojugular reflux; brisk carotid pulses without delay and no carotid bruits ?Chest: clear to auscultation, no signs of consolidation by percussion or palpation, normal fremitus, symmetrical and full respiratory excursions ?Cardiovascular: normal position and quality of the apical  impulse, regular rhythm, normal first and second heart sounds, no murmurs, rubs or gallops ?Abdomen: no tenderness or distention, no masses by palpation, no abnormal pulsatility or arterial bruits, normal bowel sounds, no hepatosplenomegaly ?Extremities: no clubbing, cyanosis or edema; 2+ radial, ulnar and brachial pulses bilaterally; 2+ right femoral, posterior tibial and dorsalis pedis pulses; 2+ left femoral, posterior tibial and dorsalis pedis pulses; no subclavian or femoral bruits ?Neurological: grossly nonfocal ?Psych: Normal mood and affect ? ? ? ?ASSESSMENT & PLAN:   ? ?1. Essential hypertension   ?2. Paroxysmal atrial fibrillation (HCC)   ?3. Acquired thrombophilia ([place])   ? ? ? ? ?HTN: Blood pressure is well controlled. ?AFib: She has never been aware of the arrhythmia  when it occurs and currently denies palpitations.  She has not had any neurological events.  In sinus rhythm today. ?Anticoagulation: She has had a couple more falls.  She is now in assisted living and hopefully this will not happen again.  I think the threshold for discontinuing anticoagulation is becoming lower and lower.  I am particularly worried that she is taking Xarelto in the unadjusted dose, although she is very elderly and quite slender/underweight.  I think it would be safer for her to take Eliquis 2.5 mg twice daily.  We will make the switch today. ? ?Patient Instructions  ?Medication Instructions:  ?STOP the Xarelto ? ?START Eliquis 2.5 mg twice daily ? ?*If you need a refill on your cardiac medications before your next appointment, please call your pharmacy* ? ? ?Lab Work: ?None ordered ?If you have labs (blood work) drawn today and your tests are completely normal, you will receive your results only by: ?MyChart Message (if you have MyChart) OR ?A paper copy in the mail ?If you have any lab test that is abnormal or we need to change your treatment, we will call you to review the results. ? ? ?Testing/Procedures: ?None  ordered ? ? ?Follow-Up: ?At [facility], you and your health needs are our priority.  As part of our continuing mission to provide you with exceptional heart care, we have created designated Provider Care Teams.

## 2022-01-03 NOTE — Telephone Encounter (Signed)
[facility] which was Referred by Dr. Leonie Man as practice for pt to get CT scan.  ?[facility] called pt son today to explain they could not do CT scan because they are not allowed to lift anyone to preform CT scan.  ?Since pt is bound to wheel chair she would have to be picked up for scan.  ?[facility] explained to pt son they would have to go to hospital to get Ct scan preformed.  ?Pt son would like a call back regarding where they are to go for CT scan.  ? ?

## 2022-01-03 NOTE — Patient Instructions (Signed)
Medication Instructions:  ?STOP the Xarelto ? ?START Eliquis 2.5 mg twice daily ? ?*If you need a refill on your cardiac medications before your next appointment, please call your pharmacy* ? ? ?Lab Work: ?None ordered ?If you have labs (blood work) drawn today and your tests are completely normal, you will receive your results only by: ?MyChart Message (if you have MyChart) OR ?A paper copy in the mail ?If you have any lab test that is abnormal or we need to change your treatment, we will call you to review the results. ? ? ?Testing/Procedures: ?None ordered ? ? ?Follow-Up: ?At [facility], you and your health needs are our priority.  As part of our continuing mission to provide you with exceptional heart care, we have created designated Provider Care Teams.  These Care Teams include your primary Cardiologist (physician) and Advanced Practice Providers (APPs -  Physician Assistants and Nurse Practitioners) who all work together to provide you with the care you need, when you need it. ? ?We recommend signing up for the patient portal called "MyChart".  Sign up information is provided on this After Visit Summary.  MyChart is used to connect with patients for Virtual Visits (Telemedicine).  Patients are able to view lab/test results, encounter notes, upcoming appointments, etc.  Non-urgent messages can be sent to your provider as well.   ?To learn more about what you can do with MyChart, go to [email or web address].   ? ?Your next appointment:   ?12 month(s) ? ?The format for your next appointment:   ?In Person ? ?Provider:   ?Sanda Klein, MD { ? ? ?Important Information About Sugar ? ? ? ? ? ? ?

## 2022-01-07 NOTE — Telephone Encounter (Signed)
pt is is a wheel she will need a lift order sent to [facility], they will reach out to the patient son to schedule.  ?

## 2022-01-09 ENCOUNTER — Telehealth: Payer: Self-pay | Admitting: Neurology

## 2022-01-09 NOTE — Telephone Encounter (Signed)
I spoke to her son again. The patient has refused to take this medication for now, even at a lower dose. Her son keeps a close check on her. If the neuropathy and RLS become more problematic, he will try to get her to reconsider the lower dose of gabapentin. If he has further concerns, he will contact our office.  ?

## 2022-01-09 NOTE — Telephone Encounter (Signed)
I spoke to patient and son. Reports extreme drowsiness with gabapentin '100mg'$ , one cap BID. She notes some benefits w/ her neuropathy and RLS but just unable to tolerate the side effects. Symptoms are present day and night. She would like to try an alternate medication.  ?

## 2022-01-09 NOTE — Telephone Encounter (Signed)
Please tell patient  or family, lateral only take gabapentin 100 mg 30 minutes before bedtime, do not take any gabapentin during the day, if needed she can take 2 tablets of gabapentin but only during bedtime ?

## 2022-01-09 NOTE — Telephone Encounter (Signed)
Pt's son would like to speak to the RN regarding the pt's gabapentin (NEURONTIN) 100 MG capsule ?He states that it makes her sleepy all the time and she is refusing to take it because of that. Please advise. ?

## 2022-01-10 ENCOUNTER — Telehealth: Payer: Self-pay | Admitting: Neurology

## 2022-01-10 NOTE — Telephone Encounter (Signed)
Pt's son is asking for a call back to further discuss pt's medication ?

## 2022-01-10 NOTE — Telephone Encounter (Signed)
See phone note from 01/09/22 for further information on this medication.  ?

## 2022-01-10 NOTE — Telephone Encounter (Signed)
The patient resides at [facility] (ph: [phone], fax [phone]). Orders for the change in gabapentin, as directed by Dr. Krista Blue below, have been signed and faxed to her care team. I also called and spoke to the facility administrator, Tye Maryland, who verbalized understanding of the treatment change. Her son is aware of the change and will let us know if she continues to have problems on the new dosing schedule.  ?

## 2022-01-10 NOTE — Telephone Encounter (Signed)
Pt's son is asking for a call back to further discuss pt's medication. ?

## 2022-02-04 ENCOUNTER — Ambulatory Visit (HOSPITAL_COMMUNITY)
Admission: RE | Admit: 2022-02-04 | Discharge: 2022-02-04 | Disposition: A | Discharge status: Home / Self Care | Payer: Medicare Other | Source: Ambulatory Visit | Attending: Neurology | Admitting: Neurology

## 2022-02-04 DIAGNOSIS — R29898 Other symptoms and signs involving the musculoskeletal system: Secondary | ICD-10-CM | POA: Diagnosis present

## 2022-02-05 ENCOUNTER — Telehealth: Payer: Self-pay | Admitting: Neurology

## 2022-02-05 DIAGNOSIS — R937 Abnormal findings on diagnostic imaging of other parts of musculoskeletal system: Secondary | ICD-10-CM

## 2022-02-05 DIAGNOSIS — M4803 Spinal stenosis, cervicothoracic region: Secondary | ICD-10-CM

## 2022-02-05 NOTE — Telephone Encounter (Signed)
Message from Dr. Leonie Man:

## 2022-02-05 NOTE — Telephone Encounter (Signed)
Patient completed CT thoracic, CT lumbar today. Abnormal findings. They would like Dr. Leonie Man to look at these results today. We have sent him a message about this patient.

## 2022-02-05 NOTE — Telephone Encounter (Signed)
[facility] is asking for a call back to provide results for pt.

## 2022-02-06 ENCOUNTER — Encounter: Payer: Self-pay | Admitting: Dermatology

## 2022-02-06 ENCOUNTER — Ambulatory Visit (INDEPENDENT_AMBULATORY_CARE_PROVIDER_SITE_OTHER): Payer: Medicare Other | Admitting: Dermatology

## 2022-02-06 DIAGNOSIS — L570 Actinic keratosis: Secondary | ICD-10-CM | POA: Diagnosis not present

## 2022-02-06 DIAGNOSIS — L989 Disorder of the skin and subcutaneous tissue, unspecified: Secondary | ICD-10-CM

## 2022-02-07 NOTE — Telephone Encounter (Signed)
Pt's Nancy Hayes would like a call from the nurse to discuss mother speaking to the neurosurgery, before she decides to have the surgery.

## 2022-02-07 NOTE — Addendum Note (Signed)
Addended by: Verlin Grills on: 02/07/2022 02:40 PM   Modules accepted: Orders

## 2022-02-07 NOTE — Telephone Encounter (Signed)
Pt's son returned call and we discussed message. He sts he has spoke with his mom and she would like to proceed with neurosurgery consult.   I have placed the referral, he will call back if he has not heard about scheduling an appt in 1 week.

## 2022-02-18 ENCOUNTER — Telehealth: Payer: Self-pay | Admitting: Neurology

## 2022-02-18 NOTE — Telephone Encounter (Signed)
Rescheduled 04/23/22 appt with pt's son over the phone (MD out), also provided him with [facility]'s # per his request.

## 2022-03-02 ENCOUNTER — Encounter: Payer: Self-pay | Admitting: Dermatology

## 2022-03-02 NOTE — Progress Notes (Signed)
   New Patient   Subjective  Nancy Hayes is a 88 y.o. female who presents for the following: New Patient (Initial Visit) (Patient here today with her son to have a lesion checked on her right forehead x 2 years. Per patient no bleeding, no pain. No personal history or family history of atypical moles, melanoma or non mole skin cancer. ).  Nonhealing crust on forehead plus several other spots to check Location:  Duration:  Quality:  Associated Signs/Symptoms: Modifying Factors:  Severity:  Timing: Context:    The following portions of the chart were reviewed this encounter and updated as appropriate:  Tobacco  Allergies  Meds  Problems  Med Hx  Surg Hx  Fam Hx      Objective  Well appearing patient in no apparent distress; mood and affect are within normal limits. Right Melolabial Fold 6 mm flat pink gritty crust  Right Forehead 1.3 cm lesion with central hemorrhagic crust and rolled pearly border, BCC       A focused examination was performed including head and neck. Relevant physical exam findings are noted in the Assessment and Plan.   Assessment & Plan  AK (actinic keratosis) Right Melolabial Fold  Currently not bothersome to patient so intervention deferred  Skin lesion Right Forehead  Although there is occasional scanty blood, this lesion is causing patient no pain and for now the family chooses to watch it and instead of proceeding with surgery.  They know they can contact me if this gets worse.

## 2022-03-05 ENCOUNTER — Other Ambulatory Visit: Payer: Self-pay | Admitting: Physician Assistant

## 2022-04-22 ENCOUNTER — Telehealth: Payer: Self-pay | Admitting: Cardiovascular Disease

## 2022-04-22 NOTE — Telephone Encounter (Signed)
Pt c/o medication issue:  1. Name of Medication:   hydrochlorothiazide (MICROZIDE) 12.5 MG capsule    2. How are you currently taking this medication (dosage and times per day)? TAKE 1 CAPSULE BY MOUTH TWICE WEEKLY ON MONDAY AND FRIDAY  3. Are you having a reaction (difficulty breathing--STAT)?  No  4. What is your medication issue? Son states that pt had labs done by PCP. He states that pt's Sodium level came back low and PCP is suggesting that the above medication be stopped. Please advise

## 2022-04-22 NOTE — Telephone Encounter (Signed)
I agree with stopping the hydrochlorothiazide, but could we please get some information regarding her blood pressure?  Will we need to use an alternative agent to keep her blood pressure normal?

## 2022-04-22 NOTE — Telephone Encounter (Signed)
RN called  spoke to Nancy Hayes at [facility].  Nancy Hayes states she spoke to  [place] earlier.   Percival Spanish PA. - would like to know what Dr Sallyanne Kuster recommended  About  stopping the hydrochlorothiazide 12.5 mg twice weekly Monday and Fridays---since it is a trending downward. Nancy Hayes states  recent lab results were faxed to the office for Dr Sallyanne Kuster to review. If so request to  send an order to nursing facility  Here are labs: Sodium 135 - 146 MMOL/L 135   Potassium 3.5 - 5.3 MMOL/L 3.2 Low    Chloride 98 - 110 MMOL/L 101   CO2 23 - 30 MMOL/L 27   BUN 8 - 24 MG/DL 15   Glucose 70 - 99 MG/DL 98   Comment: Patients taking eltrombopag at doses >/= 100 mg daily may show falsely elevated values of 10% or greater.  Creatinine 0.50 - 1.50 MG/DL 0.47 Low    Calcium 8.5 - 10.5 MG/DL 9.1   Total Protein 6.0 - 8.3 G/DL 6.3   Comment: Patients taking eltrombopag at doses >/= 100 mg daily may show falsely elevated values of 10% or greater.  Albumin  3.5 - 5.0 G/DL 2.6 Low    Total Bilirubin 0.1 - 1.2 MG/DL 0.4   Comment: Patients taking eltrombopag at doses >/= 100 mg daily may show falsely elevated values of 10% or greater.     5 mo ago (11/18/21) 11 mo ago (05/05/21) 1 yr ago (08/07/20) 1 yr ago (07/19/20) 3 yr ago (09/27/18) 5 yr ago (04/12/17) 5 yr ago (04/11/17)    Sodium 135 - 145 mmol/L 137  136  138  137  137  135  135   Potassium 3.5 - 5.1 mmol/L 3.5  3.7  4.8  3.6  3.2 Low   4.4 CM  3.3 Low    Chloride 98 - 111 mmol/L 101  102  105  100  101  106 R  105 R   CO2 22 - 32 mmol/L '27  27  23  28  26  '$ 24

## 2022-04-22 NOTE — Telephone Encounter (Signed)
Spoke to patient's son  [name].   [name] states he received an phone call from  patient's Primary   Patient's  recent labs - per son   - iron was low ,and sodium level low.    Per [name],  the nurse from [facility] office. Stated they ordered for the patient to  take Iron tablet over the counter and stop taking  Hydrochlorothiazide 12.5 mg twice weekly.  Per son, patient lives at a [facility] in [facility] [phone]. The Facility will require an order to stop and start medication.  Per [name] , Primary office stated that Dr Sallyanne Kuster will have to write an order to stop  the Hydrochlorothiazide, since he prescribed the medication. RN informed  [name], will contact primary to discuss-

## 2022-04-23 ENCOUNTER — Ambulatory Visit: Payer: Medicare Other | Admitting: Neurology

## 2022-04-26 ENCOUNTER — Other Ambulatory Visit (HOSPITAL_COMMUNITY): Payer: Self-pay | Admitting: Family Medicine

## 2022-04-26 DIAGNOSIS — I998 Other disorder of circulatory system: Secondary | ICD-10-CM

## 2022-04-29 ENCOUNTER — Ambulatory Visit (HOSPITAL_COMMUNITY): Admission: RE | Admit: 2022-04-29 | Payer: Medicare Other | Source: Ambulatory Visit

## 2022-04-29 ENCOUNTER — Encounter: Payer: Medicare Other | Admitting: Surgery

## 2022-05-03 NOTE — Telephone Encounter (Signed)
Called and spoke with the son. He stated that PCP had wanted to stop the HCTZ and cut the amlodipine in half due it decreased sodium and low blood pressure.   Call placed to [employer] at [facility] PA's office. She was not in today.  Call placed to the facility. Was left on hold for 10 minutes.

## 2022-05-07 ENCOUNTER — Ambulatory Visit: Payer: Medicare Other | Admitting: Neurology

## 2022-05-09 ENCOUNTER — Ambulatory Visit (HOSPITAL_COMMUNITY)
Admission: RE | Admit: 2022-05-09 | Discharge: 2022-05-09 | Disposition: A | Discharge status: Home / Self Care | Payer: Medicare Other | Source: Ambulatory Visit | Attending: Family Medicine | Admitting: Family Medicine

## 2022-05-09 ENCOUNTER — Encounter: Payer: Self-pay | Admitting: Vascular Surgery

## 2022-05-09 ENCOUNTER — Ambulatory Visit (INDEPENDENT_AMBULATORY_CARE_PROVIDER_SITE_OTHER): Payer: Medicare Other | Admitting: Vascular Surgery

## 2022-05-09 VITALS — BP 131/71 | HR 72 | Temp 97.7°F | Resp 20 | Ht 64.0 in | Wt 114.0 lb

## 2022-05-09 DIAGNOSIS — I70269 Atherosclerosis of native arteries of extremities with gangrene, unspecified extremity: Secondary | ICD-10-CM | POA: Diagnosis not present

## 2022-05-09 DIAGNOSIS — I998 Other disorder of circulatory system: Secondary | ICD-10-CM | POA: Diagnosis present

## 2022-05-09 NOTE — Telephone Encounter (Signed)
Left a message for Nancy Hayes to call back if anything further was needed.  The facility will send blood pressure readings to the office via fax.

## 2022-05-09 NOTE — Progress Notes (Signed)
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  The etiology of this is not clear.  She does have a history of atrial fibrillation but has been on therapeutic Eliquis.  There was some concern that maybe she got her finger caught in the wheelchair although this is not documented.  Regardless, the wound appears fairly well demarcated.  I think she likely has some underlying arterial disease in her upper extremity but I think she has adequate circulation to heal a distal finger amputation.  She has a palpable radial pulse on the right with a biphasic radial signal and a brisk palmar arch signal.  Under ideal circumstances I would consider arteriography.  However given her age she would clearly be at higher risk for intervention.  In addition, it may not be technically possible given that she is unable to straighten her legs and lie flat.  We will refer her to Dr. Phillip Heal a for evaluation of her dry gangrene of the distal right small finger.  If she ultimately require surgery and has problems healing then certainly we can reevaluate her.  REASON FOR CONSULT:    Gangrene right small finger.  The consult is requested by Bing Matter, [facility].   HPI:   SHELETHA BOW is a 88 y.o. female who was referred with an ischemic right small finger.  I have reviewed the records from the referring office.  The patient was seen on 04/15/2022.  She does have a history of anemia which has been stable.  She has atrial fibrillation and is on Eliquis.  It was noted that her right small finger had started to turn blue and was painful.  She could not remember any specific injury to the finger.  She is not sure if the finger got caught in a wheelchair potentially.  The patient is in a wheelchair and is nonambulatory.  He is unable to lay flat and straighten her legs.  She has a history of bilateral leg weakness and has been evaluated by neurology and  neurosurgery.  She has a mass compressing her spinal cord which is likely a meningioma.  Given her age and the risk involved this is being followed conservatively.  Past Medical History:  Diagnosis Date   AC (acromioclavicular) joint bone spurs    neck   Arthritis    Atrial fibrillation (HCC)    Dizzy spells    Dysrhythmia    Gout    History of kidney stones    Hypertension    Macular degeneration of both eyes    right eye   Renal disorder    pt denies   UTI (lower urinary tract infection)    Vertigo    Vision abnormalities     Family History  Problem Relation Age of Onset   Hypertension Mother    Stroke Sister     SOCIAL HISTORY: Social History   Tobacco Use   Smoking status: Never   Smokeless tobacco: Never  Substance Use Topics   Alcohol use: No    No Known Allergies  Current Outpatient Medications  Medication Sig Dispense Refill   amLODipine (NORVASC) 10 MG tablet Take 1 tablet (10 mg total) by mouth daily. 90 tablet 3   apixaban (ELIQUIS) 2.5 MG TABS tablet Take 1 tablet (2.5 mg total) by mouth 2 (two) times daily. 180 tablet 3   Calcium Carbonate-Vitamin D (CALCIUM-D PO) Take 2 tablets by  mouth daily.     Carboxymethylcellulose Sodium 0.25 % SOLN Apply 1 drop to eye 2 (two) times daily as needed (dry eyes).     cephALEXin (KEFLEX) 250 MG capsule Take 250 mg by mouth at bedtime.     Cranberry, Vacc oxycoccus, (CRANBERRY EXTRACT) 200 MG CAPS Take 200 mg by mouth 2 (two) times daily.     cyanocobalamin 1000 MCG tablet Take 1 tablet by mouth daily.     docusate sodium (COLACE) 100 MG capsule Take 100 mg by mouth daily at 4 PM.     gabapentin (NEURONTIN) 100 MG capsule Take 1 capsule (100 mg total) by mouth 2 (two) times daily. 60 capsule 3   GARLIC PO Take 1 Dose by mouth daily.     hydrochlorothiazide (MICROZIDE) 12.5 MG capsule TAKE 1 CAPSULE BY MOUTH  TWICE WEEKLY ON MONDAY AND  FRIDAY 26 capsule 3   LACTOBACILLUS BIFIDUS PO Take 1 capsule by mouth every  evening.     metoprolol succinate (TOPROL-XL) 50 MG 24 hr tablet Take 1 tablet (50 mg total) by mouth daily. Take with or immediately following a meal. 90 tablet 3   Multiple Vitamins-Minerals (PRESERVISION AREDS 2+MULTI VIT PO) Take by mouth.     MYRBETRIQ 50 MG TB24 tablet Take 50 mg by mouth daily.     Omega-3 Fatty Acids (FISH OIL) 1000 MG CAPS Take 1,000 mg by mouth daily.     omeprazole (PRILOSEC) 40 MG capsule Take 40 mg by mouth every evening.     No current facility-administered medications for this visit.    REVIEW OF SYSTEMS:  '[X]'$  denotes positive finding, '[ ]'$  denotes negative finding Cardiac  Comments:  Chest pain or chest pressure:    Shortness of breath upon exertion:    Short of breath when lying flat:    Irregular heart rhythm: x       Vascular    Pain in calf, thigh, or hip brought on by ambulation:    Pain in feet at night that wakes you up from your sleep:     Blood clot in your veins:    Leg swelling:         Pulmonary    Oxygen at home:    Productive cough:     Wheezing:         Neurologic    Sudden weakness in arms or legs:     Sudden numbness in arms or legs:     Sudden onset of difficulty speaking or slurred speech:    Temporary loss of vision in one eye:  x   Problems with dizziness:         Gastrointestinal    Blood in stool:     Vomited blood:         Genitourinary    Burning when urinating:     Blood in urine:        Psychiatric    Major depression:         Hematologic    Bleeding problems:    Problems with blood clotting too easily:        Skin    Rashes or ulcers:        Constitutional    Fever or chills:    -  PHYSICAL EXAM:   Vitals:   05/09/22 1543  BP: 131/71  Pulse: 72  Resp: 20  Temp: 97.7 F (36.5 C)  SpO2: 96%  Weight: 114 lb (51.7 kg)  Height: '5\' 4"'$  (1.626 m)  Body mass index is 19.57 kg/m. GENERAL: The patient is a well-nourished female, in no acute distress. The vital signs are documented  above. CARDIAC: There is a regular rate and rhythm.  VASCULAR: I do not detect carotid bruits or supraclavicular bruits. She has a palpable brachial and radial pulse on the right. She has a brisk biphasic radial signal on the right with a Doppler with a monophasic ulnar signal.  She has a brisk palmar arch signal on the right. She is in a wheelchair and somewhat contracted but I was able to palpate femoral pulses. I could not palpate pedal pulses however both feet appear adequately perfused without ischemic ulcers. PULMONARY: There is good air exchange bilaterally without wheezing or rales. ABDOMEN: Soft and non-tender with normal pitched bowel sounds.  MUSCULOSKELETAL: There are no major deformities. NEUROLOGIC: She has bilateral lower extremity weakness. SKIN: She has dry gangrene of the tip of the right small finger as documented in the photographs below.     PSYCHIATRIC: The patient has a normal affect.  DATA:    UPPER EXTREMITY ARTERIAL DOPPLER: I have independently interpreted her bilateral upper extremity arterial Doppler study.  Of note, the test was technically limited as she had a hard time keeping her arm still.  On the right side there is a triphasic subclavian signal with a biphasic brachial, radial, and ulnar signal.  Digital pressure 67 mmHg.  PPG tracings of the right fourth and fifth fingers demonstrate diminished amplitude.  On the left side there is a triphasic subclavian, brachial, radial, and ulnar signal.  Digital pressures 104 mmHg.   Deitra Mayo [facility]

## 2022-06-03 ENCOUNTER — Ambulatory Visit (INDEPENDENT_AMBULATORY_CARE_PROVIDER_SITE_OTHER): Payer: Medicare Other | Admitting: Neurology

## 2022-06-03 ENCOUNTER — Encounter: Payer: Self-pay | Admitting: Neurology

## 2022-06-03 VITALS — BP 95/63 | HR 73 | Ht 64.0 in

## 2022-06-03 DIAGNOSIS — Z8669 Personal history of other diseases of the nervous system and sense organs: Secondary | ICD-10-CM | POA: Diagnosis not present

## 2022-06-03 DIAGNOSIS — G8220 Paraplegia, unspecified: Secondary | ICD-10-CM

## 2022-06-03 DIAGNOSIS — D321 Benign neoplasm of spinal meninges: Secondary | ICD-10-CM | POA: Diagnosis not present

## 2022-06-03 NOTE — Patient Instructions (Signed)
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  Patient was referred to neurosurgery but is not a good surgical candidate given advanced age and multilevel spinal stenosis.  Patient is significantly labile and bed and wheelchair-bound needs 24-hour nursing care. Gabapentin 100 mg at bedtime for her restless leg she is unable to tolerate a higher dose.  Return for follow-up in the future only if needed and no scheduled follow up  appointment was made.

## 2022-06-03 NOTE — Progress Notes (Signed)
[facility] [address] [place]. [place] [address] [phone]       OFFICE FOLLOW UP VISIT NOTE  [name] Date of Birth:  01-Jul-1927 Medical Record Number:  [id number]   Referring MD:  Bing Matter PA-c  Reason for Referral:  leg weakness  HPI: Initial Visit 01/01/2022 :Nancy Hayes is a 88 year old pleasant Caucasian lady seen today for initial office consultation visit.  She is accompanied by her son.  History is obtained from them and review of electronic medical records and referral notes.  She has past medical history of hypertension, gout, arthritis, atrial fibrillation, chronic dizziness, vertigo and vision difficulties.  Patient states she moved into assisted living in September 2022 since then she has had progressive walking difficulties.  Previously she was able to walk with a walker when she first got there but was several months she has basically had trouble moving the left leg which seems to have been flexed at the hip and rotated medially and she is unable to straighten it.  She has been working with therapy but has not been able to walk.  She denies significant back pain, radicular pain.  She does have numbness in her feet and complains of restlessness but is currently not on any medication for restless leg syndrome.  She has known history of cervical spinal stenosis and had a CT scan of C-spine done on 11/18/2021 which shows severe canal stenosis most severe at C3-4.  CT brain showed age-related changes of atrophy no acute abnormalities.  Patient denies any recent fall, neck or back injury.  She has seen me in the past in 20 17-18 for chronic dizziness which she has had an off-and-on for 30+ years which was felt to be multifactorial due to combination of spinal stenosis, peripheral vestibular dysfunction and mild peripheral neuropathy.  She was last seen on 09/17/2016.  She states her dizziness and vertigo stable and not currently bothersome.  She denies  any numbness or pain in the hands though she complains of mild weakness of handgrip bilaterally. Update 06/03/2022 : She returns for follow-up after last visit 4 months ago.  He is accompanied by his son as well as caregiver.  Patient continues to remain paraplegic and bedridden and wheelchair-bound.  He is in fact become total care and when has bedsores.  He did undergo CT scan of the thoracic and lumbar spine on 02/05/2022 which showed calcified mass at T10-11 with severe cord compression likely meningioma.  There is also severe L3-4 and L4-5 spinal stenosis as well.  Patient was referred by me to neurosurgery and saw Dr. Arnoldo Morale but after discussion with him the patient and family decided not to proceed with surgery as there is no guarantee that she will get better given her multilevel and advanced age.  Patient states she is doing about the same.  She has some movement in the right leg with practically none in the left leg.  She is incontinent wears diapers.  Tolerating well without bruising or bleeding.  She has no new complaint.  Patient complains of restless legs and takes gabapentin 100 mg which seems to help well at night but the daytime dose makes her sleepy and she takes it only once a day. ROS:   14 system review of systems is positive for walking difficulty, leg weakness, dizziness, incontinence, bedsores decreased hearing all other systems negative  PMH:  Past Medical History:  Diagnosis Date   AC (acromioclavicular) joint bone spurs  neck   Arthritis    Atrial fibrillation (HCC)    Dizzy spells    Dysrhythmia    Gout    History of kidney stones    Hypertension    Macular degeneration of both eyes    right eye   Renal disorder    pt denies   UTI (lower urinary tract infection)    Vertigo    Vision abnormalities     Social History:  Social History   Socioeconomic History   Marital status: Widowed    Spouse name: Not on file   Number of children: Not on file   Years of  education: Not on file   Highest education level: Not on file  Occupational History   Not on file  Tobacco Use   Smoking status: Never   Smokeless tobacco: Never  Vaping Use   Vaping Use: Never used  Substance and Sexual Activity   Alcohol use: No   Drug use: No   Sexual activity: Not Currently  Other Topics Concern   Not on file  Social History Narrative   Not on file   Social Determinants of Health   Financial Resource Strain: Not on file  Food Insecurity: Not on file  Transportation Needs: Not on file  Physical Activity: Not on file  Stress: Not on file  Social Connections: Not on file  Intimate Partner Violence: Not on file    Medications:   Current Outpatient Medications on File Prior to Visit  Medication Sig Dispense Refill   apixaban (ELIQUIS) 2.5 MG TABS tablet Take 1 tablet (2.5 mg total) by mouth 2 (two) times daily. 180 tablet 3   calcium-vitamin D (OSCAL WITH D) 500-5 MG-MCG tablet Take 1 tablet by mouth.     cephALEXin (KEFLEX) 250 MG capsule Take 250 mg by mouth at bedtime.     Cranberry, Vacc oxycoccus, (CRANBERRY EXTRACT) 200 MG CAPS Take 200 mg by mouth 2 (two) times daily.     cyanocobalamin 1000 MCG tablet Take 1 tablet by mouth daily.     docusate sodium (COLACE) 100 MG capsule Take 100 mg by mouth daily at 4 PM.     Ferrous Sulfate (SLOW FE) 142 (45 Fe) MG TBCR Take 1 tablet by mouth daily.     gabapentin (NEURONTIN) 100 MG capsule Take 1 capsule (100 mg total) by mouth 2 (two) times daily. 60 capsule 3   GARLIC PO Take 1 Dose by mouth daily.     hydrochlorothiazide (MICROZIDE) 12.5 MG capsule TAKE 1 CAPSULE BY MOUTH  TWICE WEEKLY ON MONDAY AND  FRIDAY 26 capsule 3   LACTOBACILLUS BIFIDUS PO Take 1 capsule by mouth every evening.     loratadine (CLARITIN) 10 MG tablet Take 10 mg by mouth daily.     metoprolol succinate (TOPROL-XL) 50 MG 24 hr tablet Take 1 tablet (50 mg total) by mouth daily. Take with or immediately following a meal. 90 tablet 3    MYRBETRIQ 50 MG TB24 tablet Take 50 mg by mouth daily.     Omega-3 Fatty Acids (FISH OIL) 1000 MG CAPS Take 1,000 mg by mouth daily.     omeprazole (PRILOSEC) 40 MG capsule Take 40 mg by mouth every evening.     No current facility-administered medications on file prior to visit.    Allergies:  No Known Allergies  Physical Exam General: Frail elderly Caucasian lady, seated, in no evident distress Head: head normocephalic and atraumatic.   Neck: supple with no carotid or  supraclavicular bruits Cardiovascular: regular rate and rhythm, no murmurs Musculoskeletal: Severe kyphoscoliosis.  Left leg is medially rotated at the hip and knee  skin:  no rash.  Scattered petichiae Vascular:  Normal pulses all extremities  Neurologic Exam Mental Status: Awake and fully alert. Oriented to place and time. Recent and remote memory intact. Attention span, concentration and fund of knowledge appropriate. Mood and affect appropriate.  Cranial Nerves: Fundoscopic exam r not done s. Pupils equal, briskly reactive to light. Extraocular movements full without nystagmus. Visual fields full to confrontation. Hearing slightly diminished bilaterally.. Facial sensation intact. Face, tongue, palate moves normally and symmetrically.  Motor: Normal strength in proximal bilateral upper extremity muscles at the shoulders and elbows.  Mild weakness of grip and intrinsic hand muscles bilaterally.  4/5 weakness of left hip flexors, adductor's and knee extensors.  Bilateral ankle dorsiflexor weakness. Sensory.: intact to touch , pinprick , position and vibratory sensation.  Coordination: Rapid alternating movements normal in all extremities. Finger-to-nose and heel-to-shin performed accurately bilaterally. Gait and Station: Unable to rise from wheelchair and unable to walk even with assistance  reflexes: 3+ on the left and 2+ on the right.  No clonus. Toes downgoing.       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  She also has mild underlying peripheral neuropathy and restless leg syndrome.  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  Patient was referred to neurosurgery but is not a good surgical candidate given advanced age and multilevel spinal stenosis.  Patient is significantly labile and bed and wheelchair-bound needs 24-hour nursing care. Gabapentin 100 mg at bedtime for her restless leg she is unable to tolerate a higher dose.  Return for follow-up in the future only if needed and no scheduled follow up  appointment was made. Greater than 50% time during this 35-minute  visit was spent on counseling and coordination of care about her gait difficulties and dizziness and answering questions. Antony Contras, MD Note: This document was prepared with digital dictation and possible smart phrase technology. Any transcriptional errors that result from this process are unintentional.

## 2022-07-29 ENCOUNTER — Telehealth: Payer: Self-pay | Admitting: Cardiovascular Disease

## 2022-07-29 MED ORDER — HYDROCHLOROTHIAZIDE 12.5 MG PO CAPS: ORAL_CAPSULE | ORAL | 3 refills | Status: DC

## 2022-07-29 MED ORDER — METOPROLOL SUCCINATE ER 50 MG PO TB24: 50.0000 mg | ORAL_TABLET | Freq: Every day | ORAL | 3 refills | Status: DC

## 2022-07-29 NOTE — Telephone Encounter (Signed)
*  STAT* If patient is at the pharmacy, call can be transferred to refill team.   1. Which medications need to be refilled? (please list name of each medication and dose if known)   metoprolol succinate (TOPROL-XL) 50 MG 24 hr tablet  hydrochlorothiazide (MICROZIDE) 12.5 MG capsule  amLODipine (NORVASC) tablet 2.5 mg      2. Which pharmacy/location (including street and city if local pharmacy) is medication to be sent to?  [facility] ([place], [place]   3. Do they need a 30 day or 90 day supply?   90 day  Son stated the patient still has some of these medication left.

## 2022-08-12 ENCOUNTER — Other Ambulatory Visit: Payer: Self-pay | Admitting: Physician Assistant

## 2022-09-10 ENCOUNTER — Encounter (HOSPITAL_BASED_OUTPATIENT_CLINIC_OR_DEPARTMENT_OTHER): Payer: Medicare Other | Attending: Internal Medicine | Admitting: Internal Medicine

## 2022-09-10 DIAGNOSIS — Z7901 Long term (current) use of anticoagulants: Secondary | ICD-10-CM | POA: Diagnosis not present

## 2022-09-10 DIAGNOSIS — L89154 Pressure ulcer of sacral region, stage 4: Secondary | ICD-10-CM | POA: Insufficient documentation

## 2022-09-10 DIAGNOSIS — I480 Paroxysmal atrial fibrillation: Secondary | ICD-10-CM | POA: Diagnosis not present

## 2022-09-10 DIAGNOSIS — Z7401 Bed confinement status: Secondary | ICD-10-CM | POA: Insufficient documentation

## 2022-09-10 DIAGNOSIS — Z993 Dependence on wheelchair: Secondary | ICD-10-CM | POA: Insufficient documentation

## 2022-09-10 DIAGNOSIS — L89324 Pressure ulcer of left buttock, stage 4: Secondary | ICD-10-CM | POA: Diagnosis not present

## 2022-09-10 DIAGNOSIS — E440 Moderate protein-calorie malnutrition: Secondary | ICD-10-CM | POA: Diagnosis not present

## 2022-09-10 DIAGNOSIS — L89320 Pressure ulcer of left buttock, unstageable: Secondary | ICD-10-CM | POA: Diagnosis not present

## 2022-09-10 DIAGNOSIS — M48061 Spinal stenosis, lumbar region without neurogenic claudication: Secondary | ICD-10-CM | POA: Diagnosis not present

## 2022-09-10 DIAGNOSIS — M1990 Unspecified osteoarthritis, unspecified site: Secondary | ICD-10-CM | POA: Diagnosis not present

## 2022-09-10 DIAGNOSIS — L89314 Pressure ulcer of right buttock, stage 4: Secondary | ICD-10-CM | POA: Insufficient documentation

## 2022-09-10 DIAGNOSIS — G8222 Paraplegia, incomplete: Secondary | ICD-10-CM

## 2022-09-10 DIAGNOSIS — I10 Essential (primary) hypertension: Secondary | ICD-10-CM | POA: Insufficient documentation

## 2022-09-10 NOTE — Progress Notes (Signed)
MAYDELL, KNOEBEL ([id number]) [id number] Page 1 of 8 Visit Report for 09/10/2022 Allergy List Details Patient Name: Date of Service: Nancy Hayes 09/10/2022 8:00 A M Medical Record Number: [id number] Patient Account Number: [id number] Date of Birth/Sex: Treating RN: 1927/05/01 (89 y.o. Tonita Phoenix, Lauren Primary Care Bert Givans: Bing Matter Other Clinician: Referring Kratos Ruscitti: Treating Willford Rabideau/Extender: Truddie Hidden Weeks in Treatment: 0 Allergies Active Allergies No Known Allergies Allergy Notes Electronic Signature(s) Signed: 09/10/2022 12:54:41 PM By: Rhae Hammock RN Entered By: Rhae Hammock on 09/09/2022 08:55:13 -------------------------------------------------------------------------------- Arrival Information Details Patient Name: Date of Service: Nancy Hayes. 09/10/2022 8:00 A M Medical Record Number: [id number] Patient Account Number: [id number] Date of Birth/Sex: Treating RN: [birth year] (89 y.o. Tonita Phoenix, Lauren Primary Care Cesily Cuoco: Bing Matter Other Clinician: Referring Emri Sample: Treating Thedore Pickel/Extender: Sabino Dick in Treatment: 0 Visit Information Patient Arrived: Wheel Chair Arrival Time: 08:07 Accompanied By: self Transfer Assistance: Stormy Fabian Patient Identification Verified: Yes Secondary Verification Process Completed: Yes Patient Requires Transmission-Based Precautions: No Patient Has Alerts: No Electronic Signature(s) Signed: 09/10/2022 12:54:41 PM By: Rhae Hammock RN Entered By: Rhae Hammock on 09/10/2022 08:07:39 -------------------------------------------------------------------------------- Multi Wound Chart Details Patient Name: Date of Service: Nancy Hayes. 09/10/2022 8:00 [place] Record Number: [id number] Patient Account Number: [id number] Nancy Hayes, Nancy Hayes ([id number]) [phone].pdf Page 2 of 8 Date of  Birth/Sex: Treating RN: 16-Mar-1927 (89 y.o. F) Primary Care Xiara Knisley: Other Clinician: Bing Matter Referring Vonna Brabson: Treating Shalonda Sachse/Extender: Sabino Dick in Treatment: 0 Vital Signs Height(in): Pulse(bpm): 78 Weight(lbs): Blood Pressure(mmHg): 135/57 Body Mass Index(BMI): Temperature(F): 98.7 Respiratory Rate(breaths/min): 17 [1:Photos:] [N/A:N/A] Sacrum Left Ischium N/A Wound Location: Pressure Injury Pressure Injury N/A Wounding Event: Pressure Ulcer Pressure Ulcer N/A Primary Etiology: Arrhythmia, Hypertension, Arrhythmia, Hypertension, N/A Comorbid History: Osteoarthritis, Paraplegia Osteoarthritis, Paraplegia 09/02/2021 09/02/2021 N/A Date Acquired: 0 0 N/A Weeks of Treatment: Open Open N/A Wound Status: No No N/A Wound Recurrence: 4.5x3.7x1.4 10.3x6x2 N/A Measurements L x W x D (cm) 13.077 48.538 N/A A (cm) : rea 18.308 97.075 N/A Volume (cm) : 7 8 Starting Position 1 (o'clock): 12 5 Ending Position 1 (o'clock): 2.4 2 Maximum Distance 1 (cm): Yes Yes N/A Undermining: Category/Stage IV Unstageable/Unclassified N/A Classification: Large Large N/A Exudate A mount: Serosanguineous Serosanguineous N/A Exudate Type: red, brown red, brown N/A Exudate Color: Yes No N/A Foul Odor A Cleansing: fter No N/A N/A Odor A nticipated Due to Product Use: Distinct, outline attached Distinct, outline attached N/A Wound Margin: Medium (34-66%) Small (1-33%) N/A Granulation A mount: Red, Pink N/A N/A Granulation Quality: Medium (34-66%) Large (67-100%) N/A Necrotic A mount: Eschar, Adherent Slough Eschar, Adherent Slough N/A Necrotic Tissue: Fat Layer (Subcutaneous Tissue): Yes Fascia: No N/A Exposed Structures: Fascia: No Fat Layer (Subcutaneous Tissue): No Tendon: No Tendon: No Muscle: No Muscle: No Joint: No Joint: No Bone: No Bone: No None None N/A Epithelialization: Debridement - Selective/Open Wound  Debridement - Selective/Open Wound N/A Debridement: Pre-procedure Verification/Time Out 08:50 08:50 N/A Taken: Lidocaine 4% Topical Solution Lidocaine 4% Topical Solution N/A Pain Control: [employer] N/A Tissue Debrided: Non-Viable Tissue Non-Viable Tissue N/A Level: 1 16 N/A Debridement A (sq cm): rea Curette Blade N/A Instrument: None None N/A Bleeding: 0 0 N/A Procedural Pain: 0 0 N/A Post Procedural Pain: Procedure was tolerated well Procedure was tolerated well N/A Debridement Treatment Response: 4.5x3.7x1.4 10.3x6x2 N/A Post Debridement Measurements L x W x D (cm) 18.308 97.075 N/A Post Debridement Volume: (cm) Category/Stage IV Category/Stage  IV N/A Post Debridement Stage: Excoriation: No Excoriation: No N/A Periwound Skin Texture: Induration: No Induration: No Callus: No Callus: No Crepitus: No Crepitus: No Rash: No Rash: No Scarring: No Scarring: No Maceration: No Maceration: No N/A Periwound Skin Moisture: Dry/Scaly: No Dry/Scaly: No Nancy Hayes, Nancy Hayes ([id number]) [id number].pdf Page 3 of 8 Atrophie Blanche: No Atrophie Blanche: No N/A Periwound Skin Color: Cyanosis: No Cyanosis: No Ecchymosis: No Ecchymosis: No Erythema: No Erythema: No Hemosiderin Staining: No Hemosiderin Staining: No Mottled: No Mottled: No Pallor: No Pallor: No Rubor: No Rubor: No No Abnormality No Abnormality N/A Temperature: Yes Yes N/A Tenderness on Palpation: Debridement Debridement N/A Procedures Performed: Treatment Notes Electronic Signature(s) Signed: 09/10/2022 11:03:35 AM By: Kalman Shan DO Entered By: Kalman Shan on 09/10/2022 09:33:33 -------------------------------------------------------------------------------- Multi-Disciplinary Care Plan Details Patient Name: Date of Service: Nancy Sicks XY Hayes. 09/10/2022 8:00 A M Medical Record Number: [id number] Patient Account Number: [id number] Date of Birth/Sex: Treating  RN: 26-Nov-1926 (89 y.o. Helene Shoe, Tammi Klippel Primary Care Lacey Dotson: Bing Matter Other Clinician: Referring Estevon Fluke: Treating Deshante Cassell/Extender: Truddie Hidden Weeks in Treatment: 0 Active Inactive Necrotic Tissue Nursing Diagnoses: Impaired tissue integrity related to necrotic/devitalized tissue Knowledge deficit related to management of necrotic/devitalized tissue Goals: Necrotic/devitalized tissue will be minimized in the wound bed Date Initiated: 09/10/2022 Target Resolution Date: 10/11/2022 Goal Status: Active Patient/caregiver will verbalize understanding of reason and process for debridement of necrotic tissue Date Initiated: 09/10/2022 Target Resolution Date: 10/11/2022 Goal Status: Active Interventions: Assess patient pain level pre-, during and post procedure and prior to discharge Provide education on necrotic tissue and debridement process Treatment Activities: Apply topical anesthetic as ordered : 09/10/2022 Enzymatic debridement : 09/10/2022 Excisional debridement : 09/10/2022 Notes: Nutrition Nursing Diagnoses: Imbalanced nutrition Potential for alteratiion in Nutrition/Potential for imbalanced nutrition Goals: Patient/caregiver agrees to and verbalizes understanding of need to obtain nutritional consultation Date Initiated: 09/10/2022 Target Resolution Date: 10/11/2022 Goal Status: Active Patient/caregiver agrees to and verbalizes understanding of need to use nutritional supplements and/or vitamins as prescribed Date Initiated: 09/10/2022 Target Resolution Date: 10/10/2022 Nancy Hayes, Nancy Hayes ([id number]) [phone].pdf Page 4 of 8 Goal Status: Active Interventions: Provide education on nutrition Treatment Activities: Dietary management education, guidance and counseling : 09/10/2022 Patient referred to Primary Care Physician for further nutritional evaluation : 09/10/2022 Notes: Orientation to the Wound Care Program Nursing  Diagnoses: Knowledge deficit related to the wound healing center program Goals: Patient/caregiver will verbalize understanding of the [facility] Date Initiated: 09/10/2022 Target Resolution Date: 10/11/2022 Goal Status: Active Interventions: Provide education on orientation to the wound center Notes: Osteomyelitis Nursing Diagnoses: Infection: osteomyelitis Knowledge deficit related to disease process and management Goals: Diagnostic evaluation for osteomyelitis completed as ordered Date Initiated: 09/10/2022 Target Resolution Date: 10/11/2022 Goal Status: Active Signs and symptoms for osteomyelitis will be recognized and promptly addressed Date Initiated: 09/10/2022 Target Resolution Date: 10/11/2022 Goal Status: Active Interventions: Assess for signs and symptoms of osteomyelitis resolution every visit Provide education on osteomyelitis Treatment Activities: Surgical debridement : 09/10/2022 Systemic antibiotics : 09/10/2022 Notes: Pressure Nursing Diagnoses: Knowledge deficit related to causes and risk factors for pressure ulcer development Knowledge deficit related to management of pressures ulcers Goals: Patient/caregiver will verbalize risk factors for pressure ulcer development Date Initiated: 09/10/2022 Target Resolution Date: 10/11/2022 Goal Status: Active Patient/caregiver will verbalize understanding of pressure ulcer management Date Initiated: 09/10/2022 Target Resolution Date: 10/11/2022 Goal Status: Active Interventions: Assess: immobility, friction, shearing, incontinence upon admission and as needed Assess offloading mechanisms upon admission and as needed Assess potential  for pressure ulcer upon admission and as needed Provide education on pressure ulcers Notes: Electronic Signature(s) Signed: 09/10/2022 1:40:02 PM By: Deon Pilling RN, BSN Entered By: Deon Pilling on 09/10/2022 54:09:81 Nancy Hayes ([id number]) [id number]  Page 5 of 8 -------------------------------------------------------------------------------- Pain Assessment Details Patient Name: Date of Service: Nancy Hayes 09/10/2022 8:00 A M Medical Record Number: [id number] Patient Account Number: [id number] Date of Birth/Sex: Treating RN: 22-Jul-1927 (89 y.o. Tonita Phoenix, Lauren Primary Care Lamika Connolly: Bing Matter Other Clinician: Referring Jaiceon Collister: Treating Athel Merriweather/Extender: Truddie Hidden Weeks in Treatment: 0 Active Problems Location of Pain Severity and Description of Pain Patient Has Paino No Site Locations Pain Management and Medication Current Pain Management: Electronic Signature(s) Signed: 09/10/2022 12:54:41 PM By: Rhae Hammock RN Entered By: Rhae Hammock on 09/10/2022 08:11:18 -------------------------------------------------------------------------------- Patient/Caregiver Education Details Patient Name: Date of Service: Nancy Hayes 1/9/2024andnbsp8:00 A M Medical Record Number: [id number] Patient Account Number: [id number] Date of Birth/Gender: Treating RN: 04-20-1927 (89 y.o. Nancy Hayes Primary Care Physician: Bing Matter Other Clinician: Referring Physician: Treating Physician/Extender: Sabino Dick in Treatment: 0 Education Assessment Education Provided To: Patient Education Topics Provided Wound/Skin Impairment: Handouts: Caring for Your Ulcer Methods: Explain/Verbal Responses: Reinforcements needed Nancy Hayes, Nancy Hayes ([id number]) [id number] Page 6 of 8 Electronic Signature(s) Signed: 09/10/2022 1:40:02 PM By: Deon Pilling RN, BSN Entered By: Deon Pilling on 09/10/2022 08:29:32 -------------------------------------------------------------------------------- Wound Assessment Details Patient Name: Date of Service: Nancy Hayes. 09/10/2022 8:00 A M Medical Record Number: [id number] Patient Account Number:  [id number] Date of Birth/Sex: Treating RN: 06/03/1927 (89 y.o. Tonita Phoenix, Lauren Primary Care Zarra Geffert: Bing Matter Other Clinician: Referring Shuntay Everetts: Treating Angelyn Osterberg/Extender: Truddie Hidden Weeks in Treatment: 0 Wound Status Wound Number: 1 Primary Etiology: Pressure Ulcer Wound Location: Sacrum Wound Status: Open Wounding Event: Pressure Injury Comorbid History: Arrhythmia, Hypertension, Osteoarthritis, Paraplegia Date Acquired: 09/02/2021 Weeks Of Treatment: 0 Clustered Wound: No Photos Wound Measurements Length: (cm) 4.5 Width: (cm) 3.7 Depth: (cm) 1.4 Area: (cm) 13.077 Volume: (cm) 18.308 % Reduction in Area: % Reduction in Volume: Epithelialization: None Undermining: Yes Starting Position (o'clock): 7 Ending Position (o'clock): 12 Maximum Distance: (cm) 2.4 Wound Description Classification: Category/Stage IV Wound Margin: Distinct, outline attached Exudate Amount: Large Exudate Type: Serosanguineous Exudate Color: red, brown Foul Odor After Cleansing: Yes Due to Product Use: No Slough/Fibrino Yes Wound Bed Granulation Amount: Medium (34-66%) Exposed Structure Granulation Quality: Red, Pink Fascia Exposed: No Necrotic Amount: Medium (34-66%) Fat Layer (Subcutaneous Tissue) Exposed: Yes Necrotic Quality: Eschar, Adherent Slough Tendon Exposed: No Muscle Exposed: No Joint Exposed: No Bone Exposed: No [address] Nancy Hayes, Nancy Hayes ([id number]) [phone].pdf Page 7 of 8 No Abnormalities Noted: No No Abnormalities Noted: No Callus: No Atrophie Blanche: No Crepitus: No Cyanosis: No Excoriation: No Ecchymosis: No Induration: No Erythema: No Rash: No Hemosiderin Staining: No Scarring: No Mottled: No Pallor: No Moisture Rubor: No No Abnormalities Noted: No Dry / Scaly: No Temperature / Pain Maceration: No Temperature: No Abnormality Tenderness on Palpation: Yes Electronic  Signature(s) Signed: 09/10/2022 12:54:41 PM By: Rhae Hammock RN Signed: 09/10/2022 1:40:02 PM By: Deon Pilling RN, BSN Entered By: Deon Pilling on 09/10/2022 08:54:57 -------------------------------------------------------------------------------- Wound Assessment Details Patient Name: Date of Service: Elmon Else Hayes. 09/10/2022 8:00 A M Medical Record Number: [id number] Patient Account Number: [id number] Date of Birth/Sex: Treating RN: November 29, 1926 (89 y.o. Tonita Phoenix, Lauren Primary Care Jamieon Lannen: Bing Matter Other Clinician: Referring Janet Decesare: Treating Samrat Hayward/Extender: Thurnell Lose,  Kristen Weeks in Treatment: 0 Wound Status Wound Number: 2 Primary Etiology: Pressure Ulcer Wound Location: Left Ischium Wound Status: Open Wounding Event: Pressure Injury Comorbid History: Arrhythmia, Hypertension, Osteoarthritis, Paraplegia Date Acquired: 09/02/2021 Weeks Of Treatment: 0 Clustered Wound: No Photos Wound Measurements Length: (cm) 10.3 Width: (cm) 6 Depth: (cm) 2 Area: (cm) 48.538 Volume: (cm) 97.075 % Reduction in Area: % Reduction in Volume: Epithelialization: None Tunneling: No Undermining: Yes Starting Position (o'clock): 8 Ending Position (o'clock): 5 Maximum Distance: (cm) 2 Wound Description Classification: Unstageable/Unclassified Wound Margin: Distinct, outline attached Exudate Amount: Large Exudate Type: Serosanguineous Exudate Color: red, brown Nancy Hayes, Nancy Hayes ([id number]) Foul Odor After Cleansing: No Slough/Fibrino Yes [phone].pdf Page 8 of 8 Wound Bed Granulation Amount: Small (1-33%) Exposed Structure Necrotic Amount: Large (67-100%) Fascia Exposed: No Necrotic Quality: Eschar, Adherent Slough Fat Layer (Subcutaneous Tissue) Exposed: No Tendon Exposed: No Muscle Exposed: No Joint Exposed: No Bone Exposed: No Periwound Skin Texture Texture Color No Abnormalities Noted: No No Abnormalities Noted:  No Callus: No Atrophie Blanche: No Crepitus: No Cyanosis: No Excoriation: No Ecchymosis: No Induration: No Erythema: No Rash: No Hemosiderin Staining: No Scarring: No Mottled: No Pallor: No Moisture Rubor: No No Abnormalities Noted: No Dry / Scaly: No Temperature / Pain Maceration: No Temperature: No Abnormality Tenderness on Palpation: Yes Electronic Signature(s) Signed: 09/10/2022 12:54:41 PM By: Rhae Hammock RN Signed: 09/10/2022 1:40:02 PM By: Deon Pilling RN, BSN Entered By: Deon Pilling on 09/10/2022 09:26:02 -------------------------------------------------------------------------------- Vitals Details Patient Name: Date of Service: Nancy Sicks XY Hayes. 09/10/2022 8:00 A M Medical Record Number: [id number] Patient Account Number: [id number] Date of Birth/Sex: Treating RN: 1926/12/11 (89 y.o. Tonita Phoenix, Lauren Primary Care Willodene Stallings: Bing Matter Other Clinician: Referring Brixon Zhen: Treating Analiyah Lechuga/Extender: Truddie Hidden Weeks in Treatment: 0 Vital Signs Time Taken: 08:22 Temperature (F): 98.7 Pulse (bpm): 57 Respiratory Rate (breaths/min): 17 Blood Pressure (mmHg): 135/57 Reference Range: 80 - 120 mg / dl Electronic Signature(s) Signed: 09/10/2022 12:54:41 PM By: Rhae Hammock RN Entered By: Rhae Hammock on 09/10/2022 08:26:35

## 2022-09-10 NOTE — Progress Notes (Signed)
GOLDA, ZAVALZA ([id number]) [phone] [id number] Page 1 of 4 Visit Report for 09/10/2022 Abuse Risk Screen Details Patient Name: Date of Service: Nancy Hayes 09/10/2022 8:00 A M Medical Record Number: [id number] Patient Account Number: [id number] Date of Birth/Sex: Treating RN: 08-Oct-1926 (89 y.o. Tonita Phoenix, Lauren Primary Care Kala Gassmann: Bing Matter Other Clinician: Referring Zahara Rembert: Treating Carless Slatten/Extender: Truddie Hidden Weeks in Treatment: 0 Abuse Risk Screen Items Answer ABUSE RISK SCREEN: Has anyone close to you tried to hurt or harm you recentlyo No Do you feel uncomfortable with anyone in your familyo No Has anyone forced you do things that you didnt want to doo No Electronic Signature(s) Signed: 09/10/2022 12:54:41 PM By: Rhae Hammock RN Entered By: Rhae Hammock on 09/10/2022 08:07:53 -------------------------------------------------------------------------------- Activities of Daily Living Details Patient Name: Date of Service: Nancy Hayes 09/10/2022 8:00 A M Medical Record Number: [id number] Patient Account Number: [id number] Date of Birth/Sex: Treating RN: 1926-09-09 (89 y.o. Tonita Phoenix, Lauren Primary Care Kynsli Haapala: Bing Matter Other Clinician: Referring Zared Knoth: Treating Saraann Enneking/Extender: Truddie Hidden Weeks in Treatment: 0 Activities of Daily Living Items Answer Activities of Daily Living (Please select one for each item) Drive Automobile Not Able T Medications ake Need Assistance Use T elephone Need Assistance Care for Appearance Need Assistance Use T oilet Need Assistance Bath / Shower Need Assistance Dress Self Need Assistance Feed Self Need Assistance Walk Not Able Get In / Out Bed Need Assistance Housework Need Assistance Prepare Meals Need Assistance Handle Money Need Assistance Shop for Self Need Assistance Electronic Signature(s) Signed: 09/10/2022  12:54:41 PM By: Rhae Hammock RN Entered By: Rhae Hammock on 09/10/2022 08:08:29 Nancy Hayes ([id number]) [id number] Page 2 of 4 -------------------------------------------------------------------------------- Education Screening Details Patient Name: Date of Service: Nancy Hayes 09/10/2022 8:00 A M Medical Record Number: [id number] Patient Account Number: [id number] Date of Birth/Sex: Treating RN: 1927/01/15 (89 y.o. Tonita Phoenix, Lauren Primary Care Jolyssa Oplinger: Bing Matter Other Clinician: Referring Antonieta Slaven: Treating Ayasha Ellingsen/Extender: Sabino Dick in Treatment: 0 Primary Learner Assessed: Patient Learning Preferences/Education Level/Primary Language Learning Preference: Explanation, Demonstration, Communication Board, Printed Material Highest Education Level: High School Preferred Language: English Cognitive Barrier Language Barrier: No Translator Needed: No Memory Deficit: Yes Emotional Barrier: No Cultural/Religious Beliefs Affecting Medical Care: No Physical Barrier Impaired Vision: No Impaired Hearing: No Decreased Hand dexterity: No Knowledge/Comprehension Knowledge Level: High Comprehension Level: High Ability to understand written instructions: High Ability to understand verbal instructions: High Motivation Anxiety Level: Calm Cooperation: Cooperative Education Importance: Denies Need Interest in Health Problems: Asks Questions Perception: Coherent Willingness to Engage in Self-Management High Activities: Readiness to Engage in Self-Management High Activities: Electronic Signature(s) Signed: 09/10/2022 12:54:41 PM By: Rhae Hammock RN Entered By: Rhae Hammock on 09/10/2022 08:09:51 -------------------------------------------------------------------------------- Fall Risk Assessment Details Patient Name: Date of Service: Nancy Sicks XY G. 09/10/2022 8:00 A M Medical Record  Number: [id number] Patient Account Number: [id number] Date of Birth/Sex: Treating RN: 11/29/1926 (89 y.o. Tonita Phoenix, Lauren Primary Care Kassie Keng: Bing Matter Other Clinician: Referring Preethi Scantlebury: Treating Warren Kugelman/Extender: Truddie Hidden Weeks in Treatment: 0 Fall Risk Assessment Items Have you had 2 or more falls in the last 12 monthso 0 No Nancy Hayes, MARTINEZGARCIA ([id number]) [phone] [id number] Page 3 of 4 Have you had any fall that resulted in injury in the last 12 monthso 0 No FALLS RISK SCREEN History of falling - immediate or within 3 months 0 No Secondary diagnosis (Do you have 2 or more  medical diagnoseso) 0 No Ambulatory aid None/bed rest/wheelchair/nurse 0 No Crutches/cane/walker 0 No Furniture 0 No Intravenous therapy Access/Saline/Heparin Lock 0 No Gait/Transferring Normal/ bed rest/ wheelchair 0 No Weak (short steps with or without shuffle, stooped but able to lift head while walking, may seek 0 No support from furniture) Impaired (short steps with shuffle, may have difficulty arising from chair, head down, impaired 0 No balance) Mental Status Oriented to own ability 0 No Electronic Signature(s) Signed: 09/10/2022 12:54:41 PM By: Rhae Hammock RN Entered By: Rhae Hammock on 09/10/2022 08:09:57 -------------------------------------------------------------------------------- Foot Assessment Details Patient Name: Date of Service: Nancy Hayes. 09/10/2022 8:00 A M Medical Record Number: [id number] Patient Account Number: [id number] Date of Birth/Sex: Treating RN: [birth year] (89 y.o. Tonita Phoenix, Lauren Primary Care Adira Limburg: Bing Matter Other Clinician: Referring Annslee Tercero: Treating Braedyn Riggle/Extender: Truddie Hidden Weeks in Treatment: 0 Foot Assessment Items Site Locations + = Sensation present, - = Sensation absent, C = Callus, U = Ulcer R = Redness, W = Warmth, M = Maceration, PU =  Pre-ulcerative lesion F = Fissure, S = Swelling, D = Dryness Assessment Right: Left: Other Deformity: No No Prior Foot Ulcer: No No Prior Amputation: No No Charcot Joint: No No Ambulatory Status: Non-ambulatory Assistance Device: Wheelchair Nancy Hayes, Nancy Hayes ([id number]) [phone] [id number] Page 4 of 4 Gait: Administrator, arts) Signed: 09/10/2022 12:54:41 PM By: Rhae Hammock RN Entered By: Rhae Hammock on 09/10/2022 08:11:01 -------------------------------------------------------------------------------- Nutrition Risk Screening Details Patient Name: Date of Service: Nancy Hayes 09/10/2022 8:00 A M Medical Record Number: [id number] Patient Account Number: [id number] Date of Birth/Sex: Treating RN: 04-23-1927 (89 y.o. Nancy Hayes Primary Care Trenisha Lafavor: Bing Matter Other Clinician: Referring Revel Stellmach: Treating Suzanna Zahn/Extender: Truddie Hidden Weeks in Treatment: 0 Height (in): Weight (lbs): Body Mass Index (BMI): Nutrition Risk Screening Items Score Screening NUTRITION RISK SCREEN: I have an illness or condition that made me change the kind and/or amount of food I eat 0 No I eat fewer than two meals per day 0 No I eat few fruits and vegetables, or milk products 0 No I have three or more drinks of beer, liquor or wine almost every day 0 No I have tooth or mouth problems that make it hard for me to eat 0 No I don't always have enough money to buy the food I need 0 No I eat alone most of the time 0 No I take three or more different prescribed or over-the-counter drugs a day 0 No Without wanting to, I have lost or gained 10 pounds in the last six months 0 No I am not always physically able to shop, cook and/or feed myself 0 No Nutrition Protocols Good Risk Protocol 0 No interventions needed Moderate Risk Protocol High Risk Proctocol Risk Level: Good Risk Score: 0 Electronic Signature(s) Signed:  09/10/2022 12:54:41 PM By: Rhae Hammock RN Entered By: Rhae Hammock on 09/10/2022 08:10:04

## 2022-09-10 NOTE — Progress Notes (Signed)
INFANT, DOANE ([id number]) [id number] Page 1 of 10 Visit Report for 09/10/2022 Chief Complaint Document Details Patient Name: Date of Service: Nancy Hayes 09/10/2022 8:00 A M Medical Record Number: [id number] Patient Account Number: [id number] Date of Birth/Sex: Treating RN: July 22, 1927 (89 y.o. F) Primary Care Provider: Bing Matter Other Clinician: Referring Provider: Treating Provider/Extender: Truddie Hidden Weeks in Treatment: 0 Information Obtained from: Patient Chief Complaint 09/10/2022; sacral and left buttocks wound Electronic Signature(s) Signed: 09/10/2022 11:03:35 AM By: Kalman Shan DO Entered By: Kalman Shan on 09/10/2022 09:33:41 -------------------------------------------------------------------------------- Debridement Details Patient Name: Date of Service: Nancy Hayes. 09/10/2022 8:00 A M Medical Record Number: [id number] Patient Account Number: [id number] Date of Birth/Sex: Treating RN: 11-Sep-1926 (89 y.o. Nancy Hayes, Meta.Reding Primary Care Provider: Bing Matter Other Clinician: Referring Provider: Treating Provider/Extender: Truddie Hidden Weeks in Treatment: 0 Debridement Performed for Assessment: Wound #1 Sacrum Performed By: Physician Kalman Shan, DO Debridement Type: Debridement Level of Consciousness (Pre-procedure): Awake and Alert Pre-procedure Verification/Time Out Yes - 08:50 Taken: Start Time: 08:51 Pain Control: Lidocaine 4% T opical Solution T Area Debrided (L x W): otal 1 (cm) x 1 (cm) = 1 (cm) Tissue and other material debrided: Non-Viable, Slough, Slough Level: Non-Viable Tissue Debridement Description: Selective/Open Wound Instrument: Curette Bleeding: None End Time: 08:55 Procedural Pain: 0 Post Procedural Pain: 0 Response to Treatment: Procedure was tolerated well Level of Consciousness (Post- Awake and Alert procedure): Post Debridement  Measurements of Total Wound Length: (cm) 4.5 Stage: Category/Stage IV Width: (cm) 3.7 Depth: (cm) 1.4 Volume: (cm) 18.308 Character of Wound/Ulcer Post Debridement: Requires Further Debridement Post Procedure Diagnosis Same as Pre-procedure AJEENAH, HEINY ([id number]) [id number] Page 2 of 10 Electronic Signature(s) Signed: 09/10/2022 11:03:35 AM By: Kalman Shan DO Signed: 09/10/2022 1:40:02 PM By: Deon Pilling RN, BSN Entered By: Deon Pilling on 09/10/2022 08:55:49 -------------------------------------------------------------------------------- Debridement Details Patient Name: Date of Service: Nancy Sicks XY G. 09/10/2022 8:00 A M Medical Record Number: [id number] Patient Account Number: [id number] Date of Birth/Sex: Treating RN: 11-08-1926 (89 y.o. Nancy Hayes, Tammi Klippel Primary Care Provider: Bing Matter Other Clinician: Referring Provider: Treating Provider/Extender: Truddie Hidden Weeks in Treatment: 0 Debridement Performed for Assessment: Wound #2 Left Ischium Performed By: Physician Kalman Shan, DO Debridement Type: Debridement Level of Consciousness (Pre-procedure): Awake and Alert Pre-procedure Verification/Time Out Yes - 08:50 Taken: Start Time: 08:51 Pain Control: Lidocaine 4% T opical Solution T Area Debrided (L x W): otal 4 (cm) x 4 (cm) = 16 (cm) Tissue and other material debrided: Non-Viable, Slough, Slough Level: Non-Viable Tissue Debridement Description: Selective/Open Wound Instrument: Blade Bleeding: None End Time: 08:55 Procedural Pain: 0 Post Procedural Pain: 0 Response to Treatment: Procedure was tolerated well Level of Consciousness (Post- Awake and Alert procedure): Post Debridement Measurements of Total Wound Length: (cm) 10.3 Stage: Category/Stage IV Width: (cm) 6 Depth: (cm) 2 Volume: (cm) 97.075 Character of Wound/Ulcer Post Debridement: Requires Further Debridement Post Procedure  Diagnosis Same as Pre-procedure Electronic Signature(s) Signed: 09/10/2022 11:03:35 AM By: Kalman Shan DO Signed: 09/10/2022 1:40:02 PM By: Deon Pilling RN, BSN Entered By: Deon Pilling on 09/10/2022 08:58:15 -------------------------------------------------------------------------------- HPI Details Patient Name: Date of Service: Nancy Sicks XY G. 09/10/2022 8:00 A M Medical Record Number: [id number] Patient Account Number: [id number] Date of Birth/Sex: Treating RN: 08/03/1927 (89 y.o. F) Primary Care Provider: Bing Matter Other Clinician: Referring Provider: Treating Provider/Extender: Sabino Dick in Treatment: 0 Nancy, Hayes ([id number]) [id number] Page 3 of 10  History of Present Illness HPI Description: 09/10/2022 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 She is currently bedridden and wheelchair- bound. She has followed up with neurosurgery but is not a surgical candidate. Other past medical history includes essential hypertension and paroxysmal A-fib On Eliquis. She presents today with 2 ulcers located to the sacrum and left buttocks that have been present for at least a month. It is documented in the neurology note 06/03/2022 that she has had bedsores at that time. She currently denies systemic signs of infection. Electronic Signature(s) Signed: 09/10/2022 11:03:35 AM By: Kalman Shan DO Entered By: Kalman Shan on 09/10/2022 09:42:35 -------------------------------------------------------------------------------- Physical Exam Details Patient Name: Date of Service: Nancy Else G. 09/10/2022 8:00 A M Medical Record Number: [id number] Patient Account Number: [id number] Date of Birth/Sex:  Treating RN: Jan 17, 1927 (89 y.o. F) Primary Care Provider: Bing Matter Other Clinician: Referring Provider: Treating Provider/Extender: Truddie Hidden Weeks in Treatment: 0 Constitutional respirations regular, non-labored and within target range for patient.Marland Kitchen Psychiatric pleasant and cooperative. Notes Sacrum: Granulation tissue and nonviable tissue with undermining circumferentially Left buttocks: Large open wound with nonviable tissue. Currently does not probe to bone. Electronic Signature(s) Signed: 09/10/2022 11:03:35 AM By: Kalman Shan DO Entered By: Kalman Shan on 09/10/2022 09:44:15 -------------------------------------------------------------------------------- Physician Orders Details Patient Name: Date of Service: Nancy Else G. 09/10/2022 8:00 A M Medical Record Number: [id number] Patient Account Number: [id number] Date of Birth/Sex: Treating RN: 1927-01-19 (89 y.o. Nancy Hayes, Tammi Klippel Primary Care Provider: Bing Matter Other Clinician: Referring Provider: Treating Provider/Extender: Sabino Dick in Treatment: 0 Verbal / Phone Orders: No Diagnosis Coding ICD-10 Coding Code Description [phone] Pressure ulcer of left buttock, stage 4 L89.314 Pressure ulcer of right buttock, stage 4 I10 Essential (primary) hypertension I48.0 Paroxysmal atrial fibrillation Z79.01 Long term (current) use of anticoagulants Nancy, Hayes ([id number]) [id number] Page 4 of 10 Follow-up Appointments Return appointment in 1 month. - Dr. Heber [facility] Tuesday 12;45 10/08/2022 *NO APPOINTMENT BETWEEN 11-12* Bathing/ Shower/ Hygiene Do not shower or bathe in tub. Off-Loading Low air-loss mattress (Group 2) - facility to ensure patient patient is currently using an air mattress. Turn and reposition every 2 hours Other: - continue using specialty wheelchair cushion. bunny boots to bilateral heels. Non Wound  Condition Protect area with: - any bony prominences with border foam. Other Non Wound Condition Orders/Instructions: - any skin tears apply xeroform and bordered foam Wound Treatment Wound #1 - Sacrum Cleanser: Wound Cleanser 1 x Per Day/30 Days Discharge Instructions: Cleanse the wound with wound cleanser prior to applying a clean dressing using gauze sponges, not tissue or cotton balls. Prim Dressing: Dakin's Solution 0.25%, 16 (oz) 1 x Per Day/30 Days ary Discharge Instructions: Moisten gauze with Dakin's solution Secondary Dressing: Zetuvit Plus Silicone Border Dressing 7x7(in/in) 1 x Per Day/30 Days Discharge Instructions: Apply silicone border over primary dressing as directed. Wound #2 - Ischium Wound Laterality: Left Cleanser: Wound Cleanser 1 x Per Day/30 Days Discharge Instructions: Cleanse the wound with wound cleanser prior to applying a clean dressing using gauze sponges, not tissue or cotton balls. Prim Dressing: Dakin's Solution 0.25%, 16 (oz) 1 x Per Day/30 Days ary Discharge Instructions: Moisten gauze with Dakin's solution Secondary Dressing: Zetuvit Plus Silicone Border Dressing 7x7(in/in) 1 x Per Day/30 Days Discharge Instructions: Apply  silicone border over primary dressing as directed. Electronic Signature(s) Signed: 09/10/2022 11:03:35 AM By: Kalman Shan DO Entered By: Kalman Shan on 09/10/2022 09:44:26 -------------------------------------------------------------------------------- Problem List Details Patient Name: Date of Service: Nancy Else G. 09/10/2022 8:00 A M Medical Record Number: [id number] Patient Account Number: [id number] Date of Birth/Sex: Treating RN: 1927-03-20 (89 y.o. Nancy Hayes Primary Care Provider: Bing Matter Other Clinician: Referring Provider: Treating Provider/Extender: Truddie Hidden Weeks in Treatment: 0 Active Problems ICD-10 Encounter Code Description Active Date MDM Diagnosis L89.154  Pressure ulcer of sacral region, stage 4 09/10/2022 No Yes L89.320 Pressure ulcer of left buttock, unstageable 09/10/2022 No Yes I10 Essential (primary) hypertension 09/10/2022 No Yes TAYRA, DAWE ([id number]) [id number] Page 5 of 10 Z79.01 Long term (current) use of anticoagulants 09/10/2022 No Yes G82.22 Paraplegia, incomplete 09/10/2022 No Yes M48.00 Spinal stenosis, site unspecified 09/10/2022 No Yes E44.0 Moderate protein-calorie malnutrition 09/10/2022 No Yes I48.0 Paroxysmal atrial fibrillation 09/10/2022 No Yes Inactive Problems Resolved Problems Electronic Signature(s) Signed: 09/10/2022 11:03:35 AM By: Kalman Shan DO Entered By: Kalman Shan on 09/10/2022 09:33:11 -------------------------------------------------------------------------------- Progress Note Details Patient Name: Date of Service: Nancy Else G. 09/10/2022 8:00 A M Medical Record Number: [id number] Patient Account Number: [id number] Date of Birth/Sex: Treating RN: 08-03-1927 (89 y.o. F) Primary Care Provider: Bing Matter Other Clinician: Referring Provider: Treating Provider/Extender: Sabino Dick in Treatment: 0 Subjective Chief Complaint Information obtained from Patient 09/10/2022; sacral and left buttocks wound History of Present Illness (HPI) 09/10/2022 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 She is currently bedridden and wheelchair- bound. She has followed up with neurosurgery but is not a surgical candidate. Other past medical history includes essential hypertension and paroxysmal A-fib On Eliquis. She presents today with 2 ulcers located to the sacrum and left buttocks that have been present for at least a month. It is  documented in the neurology note 06/03/2022 that she has had bedsores at that time. She currently denies systemic signs of infection. Patient History Information obtained from Patient, Chart. Allergies No Known Allergies Family History Unknown History. Social History Never smoker, Marital Status - Widowed, Alcohol Use - Never, Drug Use - No History, Caffeine Use - Rarely. Medical History Cardiovascular Patient has history of Arrhythmia - A fibb, Hypertension TANECIA, MCCAY ([id number]) [id number] Page 6 of 10 Denies history of Congestive Heart Failure, Coronary Artery Disease, Deep Vein Thrombosis, Hypotension, Myocardial Infarction, Peripheral Arterial Disease, Peripheral Venous Disease, Phlebitis, Vasculitis Musculoskeletal Patient has history of Osteoarthritis Neurologic Patient has history of Paraplegia Denies history of Dementia, Neuropathy, Quadriplegia Hospitalization/Surgery History - EGD with propfol. - balloon dilation. - extracorporeal shock wave lithotripsy. - cataract extraction. - partial hysterectomy but pt. states total. Medical A Surgical History Notes nd Neurologic resteless leg syndrome Review of Systems (ROS) Constitutional Symptoms (General Health) Denies complaints or symptoms of Fatigue, Fever, Chills, Marked Weight Change. Eyes macular degeneration of both eyes Ear/Nose/Mouth/Throat Denies complaints or symptoms of Chronic sinus problems or rhinitis. Respiratory Denies complaints or symptoms of Chronic or frequent coughs, Shortness of Breath. Gastrointestinal Denies complaints or symptoms of Frequent diarrhea, Nausea, Vomiting. Endocrine Denies complaints or symptoms of Heat/cold intolerance. Genitourinary incontinent; renal disorder; hx of kidney stones Integumentary (Skin) Complains or has symptoms of Wounds. Musculoskeletal Denies complaints or symptoms of Muscle Pain,  Muscle Weakness. Psychiatric Denies complaints  or symptoms of Claustrophobia. Objective Constitutional respirations regular, non-labored and within target range for patient.. Vitals Time Taken: 8:22 AM, Temperature: 98.7 F, Pulse: 57 bpm, Respiratory Rate: 17 breaths/min, Blood Pressure: 135/57 mmHg. Psychiatric pleasant and cooperative. General Notes: Sacrum: Granulation tissue and nonviable tissue with undermining circumferentially Left buttocks: Large open wound with nonviable tissue. Currently does not probe to bone. Integumentary (Hair, Skin) Wound #1 status is Open. Original cause of wound was Pressure Injury. The date acquired was: 09/02/2021. The wound is located on the Sacrum. The wound measures 4.5cm length x 3.7cm width x 1.4cm depth; 13.077cm^2 area and 18.308cm^3 volume. There is Fat Layer (Subcutaneous Tissue) exposed. There is undermining starting at 7:00 and ending at 12:00 with a maximum distance of 2.4cm. There is a large amount of serosanguineous drainage noted. Foul odor after cleansing was noted. The wound margin is distinct with the outline attached to the wound base. There is medium (34-66%) red, pink granulation within the wound bed. There is a medium (34-66%) amount of necrotic tissue within the wound bed including Eschar and Adherent Slough. The periwound skin appearance did not exhibit: Callus, Crepitus, Excoriation, Induration, Rash, Scarring, Dry/Scaly, Maceration, Atrophie Blanche, Cyanosis, Ecchymosis, Hemosiderin Staining, Mottled, Pallor, Rubor, Erythema. Periwound temperature was noted as No Abnormality. The periwound has tenderness on palpation. Wound #2 status is Open. Original cause of wound was Pressure Injury. The date acquired was: 09/02/2021. The wound is located on the Left Ischium. The wound measures 10.3cm length x 6cm width x 2cm depth; 48.538cm^2 area and 97.075cm^3 volume. There is no tunneling noted, however, there is undermining starting at 8:00 and ending at 5:00 with a maximum distance of 2cm.  There is a large amount of serosanguineous drainage noted. The wound margin is distinct with the outline attached to the wound base. There is small (1-33%) granulation within the wound bed. There is a large (67-100%) amount of necrotic tissue within the wound bed including Eschar and Adherent Slough. The periwound skin appearance did not exhibit: Callus, Crepitus, Excoriation, Induration, Rash, Scarring, Dry/Scaly, Maceration, Atrophie Blanche, Cyanosis, Ecchymosis, Hemosiderin Staining, Mottled, Pallor, Rubor, Erythema. Periwound temperature was noted as No Abnormality. The periwound has tenderness on palpation. Assessment Active Problems ICD-10 Pressure ulcer of sacral region, stage 4 Pressure ulcer of left buttock, unstageable Essential (primary) hypertension Long term (current) use of anticoagulants Paraplegia, incomplete Nancy, Hayes ([id number]) [phone].pdf Page 7 of 10 Spinal stenosis, site unspecified Moderate protein-calorie malnutrition Paroxysmal atrial fibrillation Patient presents with an unstageable wound to her left buttocks and a stage IV pressure ulcer to her sacrum that is nonhealing for the past 1 to 3 months. She is currently bedbound due to spinal meningioma and severe cord compression along with severe spinal stenosis. She is unable to offload the wounds well. Son is present and long discussion was had with son and patient about the importance of offloading and the potential for these wounds not to heal. They expressed understanding. I debrided nonviable tissue. The left buttocks wound still has tightly adhered nonviable tissue. I recommended Dakin's wet-to-dry dressings and aggressive offloading. She knows to go to the emergency room if she develops increased warmth, erythema or purulent drainage, fever/chills, nausea/vomiting. Due to patient's several comorbidities if she does not show improvement in healing over the next couple months she  may need palliative care. Procedures Wound #1 Pre-procedure diagnosis of Wound #1 is a Pressure Ulcer located on the Sacrum . There was a Selective/Open Wound Non-Viable Tissue  Debridement with a total area of 1 sq cm performed by Kalman Shan, DO. With the following instrument(s): Curette to remove Non-Viable tissue/material. Material removed includes [facility] after achieving pain control using Lidocaine 4% Topical Solution. A time out was conducted at 08:50, prior to the start of the procedure. There was no bleeding. The procedure was tolerated well with a pain level of 0 throughout and a pain level of 0 following the procedure. Post Debridement Measurements: 4.5cm length x 3.7cm width x 1.4cm depth; 18.308cm^3 volume. Post debridement Stage noted as Category/Stage IV. Character of Wound/Ulcer Post Debridement requires further debridement. Post procedure Diagnosis Wound #1: Same as Pre-Procedure Wound #2 Pre-procedure diagnosis of Wound #2 is a Pressure Ulcer located on the Left Ischium . There was a Selective/Open Wound Non-Viable Tissue Debridement with a total area of 16 sq cm performed by Kalman Shan, DO. With the following instrument(s): Blade to remove Non-Viable tissue/material. Material removed includes Slough after achieving pain control using Lidocaine 4% T opical Solution. A time out was conducted at 08:50, prior to the start of the procedure. There was no bleeding. The procedure was tolerated well with a pain level of 0 throughout and a pain level of 0 following the procedure. Post Debridement Measurements: 10.3cm length x 6cm width x 2cm depth; 97.075cm^3 volume. Post debridement Stage noted as Category/Stage IV. Character of Wound/Ulcer Post Debridement requires further debridement. Post procedure Diagnosis Wound #2: Same as Pre-Procedure Plan Follow-up Appointments: Return appointment in 1 month. - Dr. Heber [place] Tuesday 12;45 10/08/2022 *NO APPOINTMENT BETWEEN  11-12* Bathing/ Shower/ Hygiene: Do not shower or bathe in tub. Off-Loading: Low air-loss mattress (Group 2) - facility to ensure patient patient is currently using an air mattress. Turn and reposition every 2 hours Other: - continue using specialty wheelchair cushion. bunny boots to bilateral heels. Non Wound Condition: Protect area with: - any bony prominences with border foam. Other Non Wound Condition Orders/Instructions: - any skin tears apply xeroform and bordered foam WOUND #1: - Sacrum Wound Laterality: Cleanser: Wound Cleanser 1 x Per Day/30 Days Discharge Instructions: Cleanse the wound with wound cleanser prior to applying a clean dressing using gauze sponges, not tissue or cotton balls. Prim Dressing: Dakin's Solution 0.25%, 16 (oz) 1 x Per Day/30 Days ary Discharge Instructions: Moisten gauze with Dakin's solution Secondary Dressing: Zetuvit Plus Silicone Border Dressing 7x7(in/in) 1 x Per Day/30 Days Discharge Instructions: Apply silicone border over primary dressing as directed. WOUND #2: - Ischium Wound Laterality: Left Cleanser: Wound Cleanser 1 x Per Day/30 Days Discharge Instructions: Cleanse the wound with wound cleanser prior to applying a clean dressing using gauze sponges, not tissue or cotton balls. Prim Dressing: Dakin's Solution 0.25%, 16 (oz) 1 x Per Day/30 Days ary Discharge Instructions: Moisten gauze with Dakin's solution Secondary Dressing: Zetuvit Plus Silicone Border Dressing 7x7(in/in) 1 x Per Day/30 Days Discharge Instructions: Apply silicone border over primary dressing as directed. 1. Dakin's wet-to-dry dressings 2. Aggressive offloading 3. Follow-up in 1 month 4. In office sharp debridement Electronic Signature(s) Signed: 09/10/2022 11:03:35 AM By: Kalman Shan DO Entered By: Kalman Shan on 09/10/2022 09:50:23 Nancy Hayes ([id number]) [id number] Page 8 of  10 -------------------------------------------------------------------------------- HxROS Details Patient Name: Date of Service: Nancy Hayes 09/10/2022 8:00 A M Medical Record Number: [id number] Patient Account Number: [id number] Date of Birth/Sex: Treating RN: 21-Feb-1927 (89 y.o. Tonita Phoenix, Lauren Primary Care Provider: Bing Matter Other Clinician: Referring Provider: Treating Provider/Extender: Truddie Hidden Weeks in Treatment: 0 Information Obtained From  Patient Chart Constitutional Symptoms (General Health) Complaints and Symptoms: Negative for: Fatigue; Fever; Chills; Marked Weight Change Ear/Nose/Mouth/Throat Complaints and Symptoms: Negative for: Chronic sinus problems or rhinitis Respiratory Complaints and Symptoms: Negative for: Chronic or frequent coughs; Shortness of Breath Gastrointestinal Complaints and Symptoms: Negative for: Frequent diarrhea; Nausea; Vomiting Endocrine Complaints and Symptoms: Negative for: Heat/cold intolerance Integumentary (Skin) Complaints and Symptoms: Positive for: Wounds Musculoskeletal Complaints and Symptoms: Negative for: Muscle Pain; Muscle Weakness Medical History: Positive for: Osteoarthritis Psychiatric Complaints and Symptoms: Negative for: Claustrophobia Eyes Complaints and Symptoms: Review of System Notes: macular degeneration of both eyes Hematologic/Lymphatic Cardiovascular Medical History: Positive for: Arrhythmia - A fibb; Hypertension Negative for: Congestive Heart Failure; Coronary Artery Disease; Deep Vein Thrombosis; Hypotension; Myocardial Infarction; Peripheral Arterial Disease; Peripheral Venous Disease; Phlebitis; Vasculitis Genitourinary Nancy, Hayes ([id number]) [id number] Page 9 of 10 Complaints and Symptoms: Review of System Notes: incontinent; renal disorder; hx of kidney stones Immunological Neurologic Medical History: Positive  for: Paraplegia Negative for: Dementia; Neuropathy; Quadriplegia Past Medical History Notes: resteless leg syndrome Oncologic Immunizations Pneumococcal Vaccine: Received Pneumococcal Vaccination: Yes Received Pneumococcal Vaccination On or After 60th Birthday: Yes Implantable Devices None Hospitalization / Surgery History Type of Hospitalization/Surgery EGD with propfol balloon dilation extracorporeal shock wave lithotripsy cataract extraction partial hysterectomy but pt. states total Family and Social History Unknown History: Yes; Never smoker; Marital Status - Widowed; Alcohol Use: Never; Drug Use: No History; Caffeine Use: Rarely; Financial Concerns: No; Food, Clothing or Shelter Needs: No; Support System Lacking: No; Transportation Concerns: No Electronic Signature(s) Signed: 09/10/2022 11:03:35 AM By: Kalman Shan DO Signed: 09/10/2022 12:54:41 PM By: Rhae Hammock RN Entered By: Rhae Hammock on 09/09/2022 08:59:46 -------------------------------------------------------------------------------- SuperBill Details Patient Name: Date of Service: Nancy Hayes 09/10/2022 Medical Record Number: [id number] Patient Account Number: [id number] Date of Birth/Sex: Treating RN: [birth year] (89 y.o. 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, incomplete M48.00 Spinal stenosis, site unspecified E44.0 Moderate protein-calorie malnutrition I48.0 Paroxysmal atrial fibrillation Nancy, Hayes ([id number]) [id number] Page 10 of 10 Facility Procedures : CPT4 Code:  [id number] Description: 202 227 8746 - DEBRIDE WOUND 1ST 20 SQ CM OR < ICD-10 Diagnosis Description L89.154 Pressure ulcer of sacral region, stage 4 L89.320 Pressure ulcer of left buttock, unstageable Modifier: Quantity: 1 Physician Procedures : CPT4 Code Description Modifier [id number] - WC PHYS LEVEL 4 - NEW PT ICD-10 Diagnosis Description L89.154 Pressure ulcer of sacral region, stage 4 L89.320 Pressure ulcer of left buttock, unstageable 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

## 2022-09-30 IMAGING — CT CT CERVICAL SPINE W/O CM
2 of 3 series · 10 of 29 positions shown, 13 images · non-contrast
Comparison: CT head and cervical spine 05/05/2021.

CLINICAL DATA: Head trauma, minor (Age >= 65y); Neck trauma (Age >=
65y)



[Series 5: sagittal bone · sagittal · 0.26mm/px · 5 of 61 slices shown, 6 images]
[im 21/61  bone]
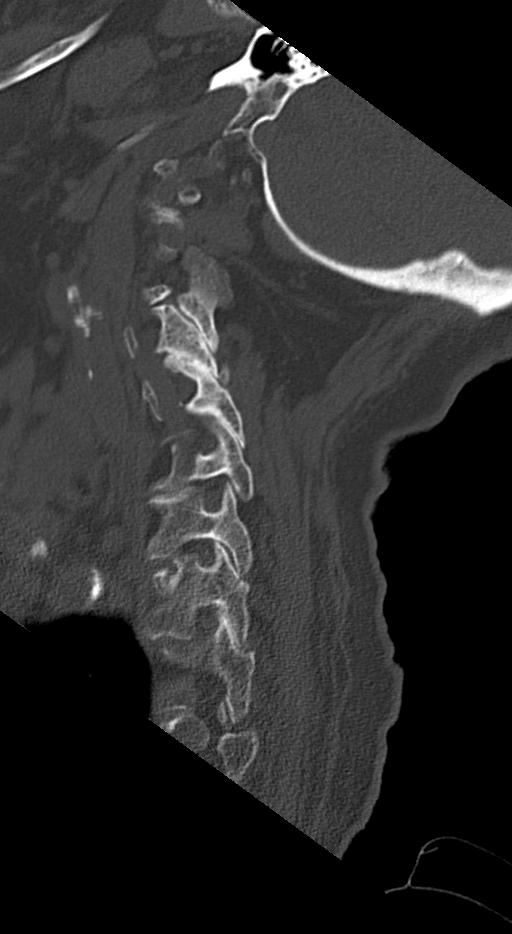
[im 26/61  bone]
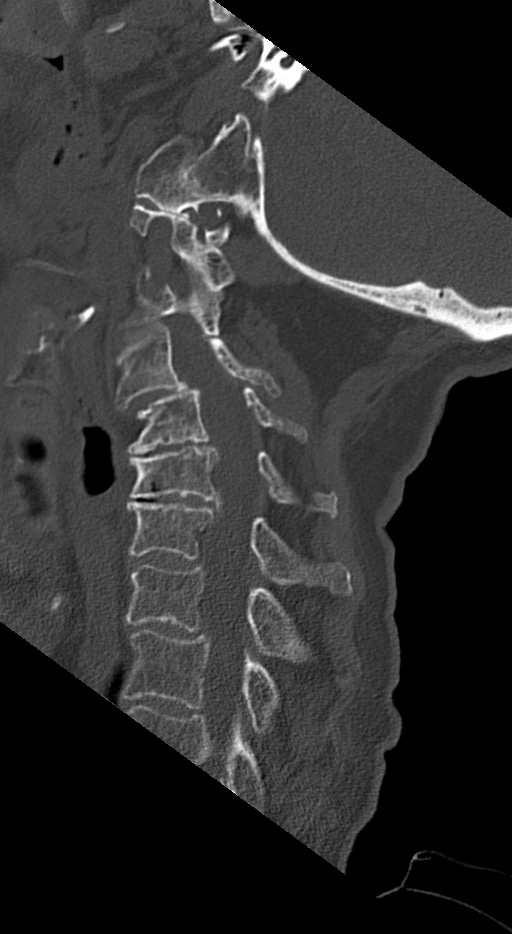
[im 31/61  soft-tissue]
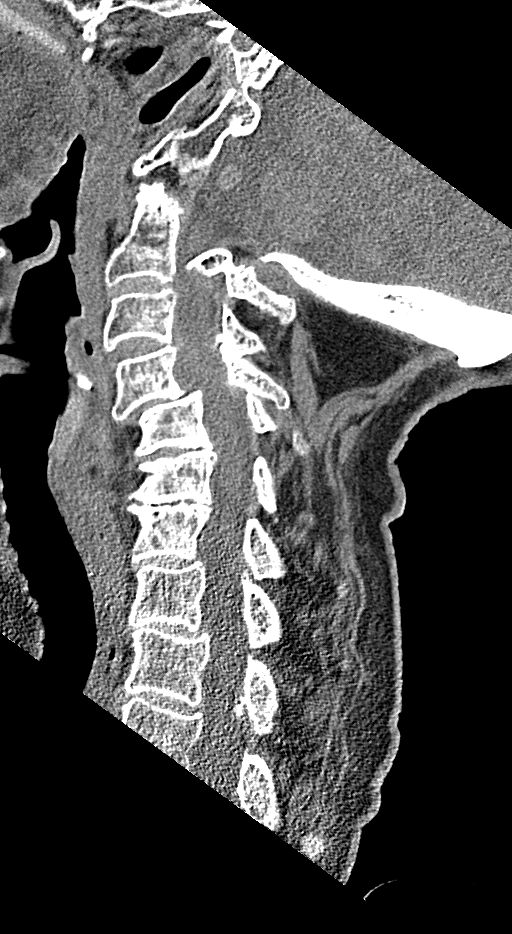
[im 31/61  bone]
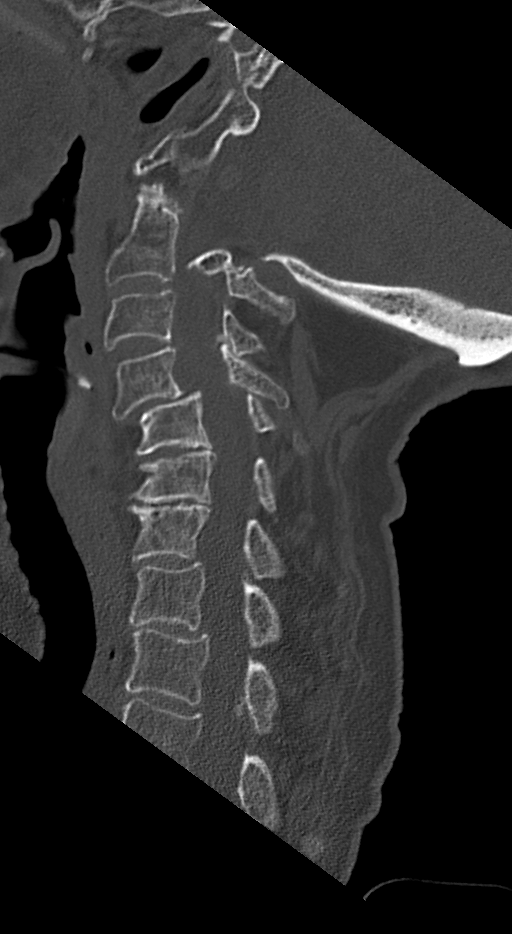
[im 36/61  bone]
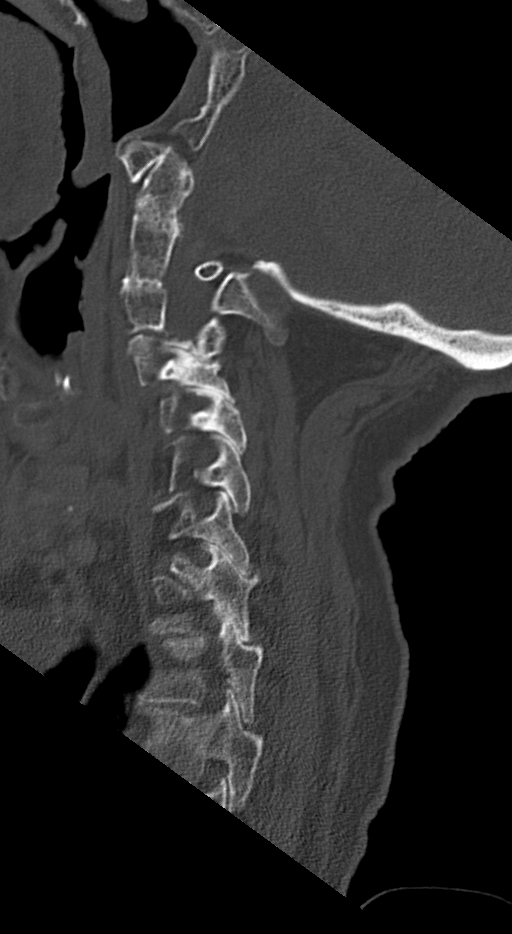
[im 41/61  bone]
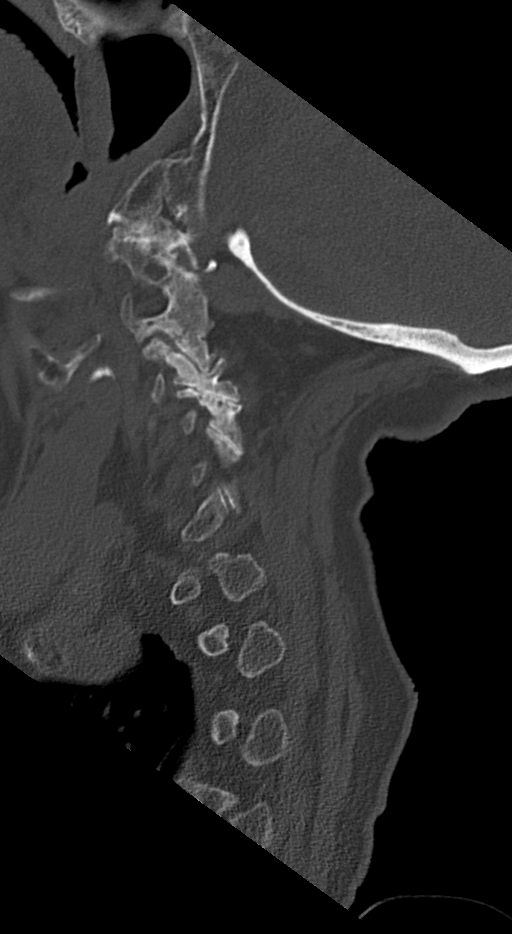

[Series 7: orthogonal axials · axial · 0.21mm/px · z∈[-760,-683]mm · 5 of 83 slices shown, 7 images]
[im 14/83  soft-tissue]
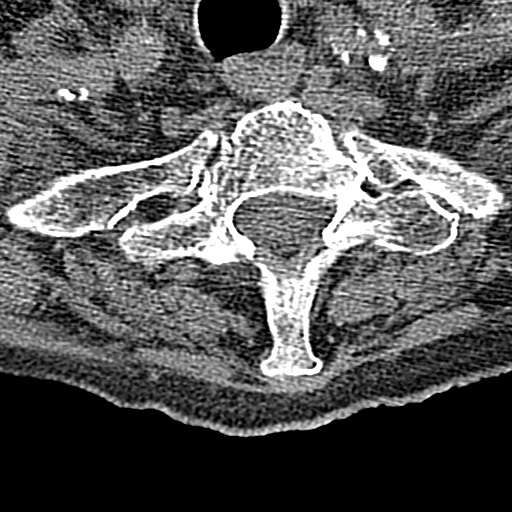
[im 14/83  bone]
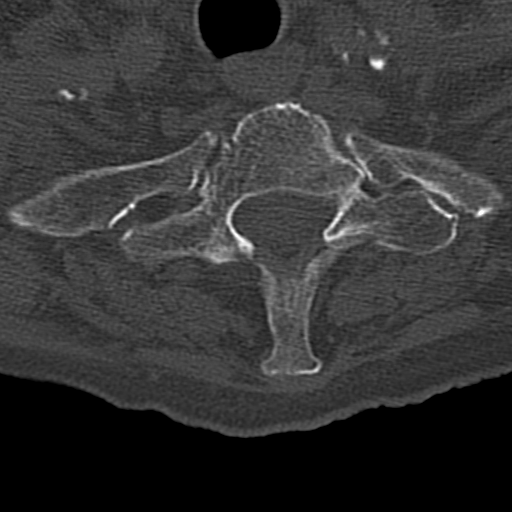
[im 28/83  bone]
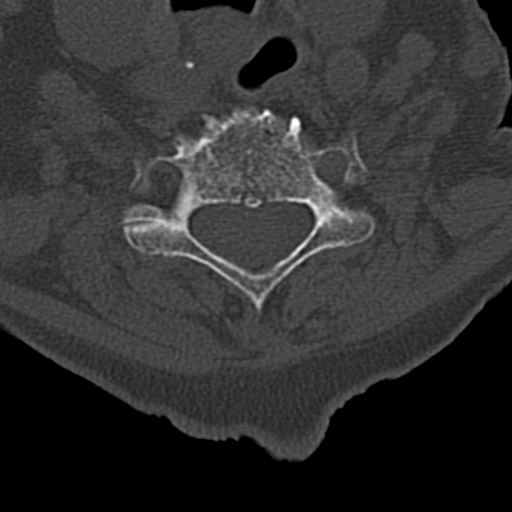
[im 42/83  bone]
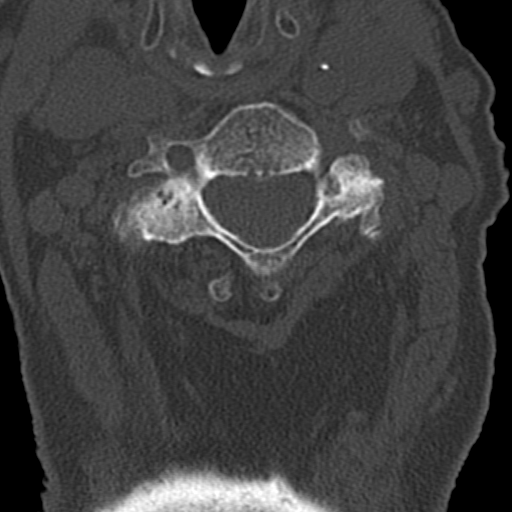
[im 55/83  bone]
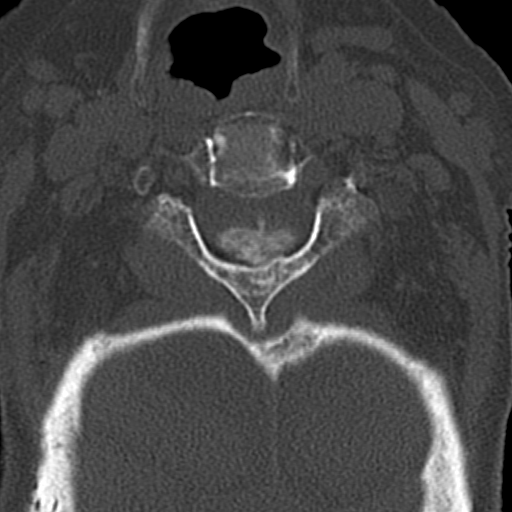
[im 69/83  soft-tissue]
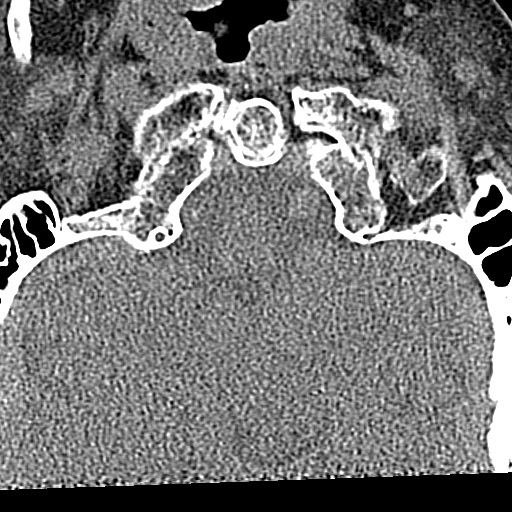
[im 69/83  bone]
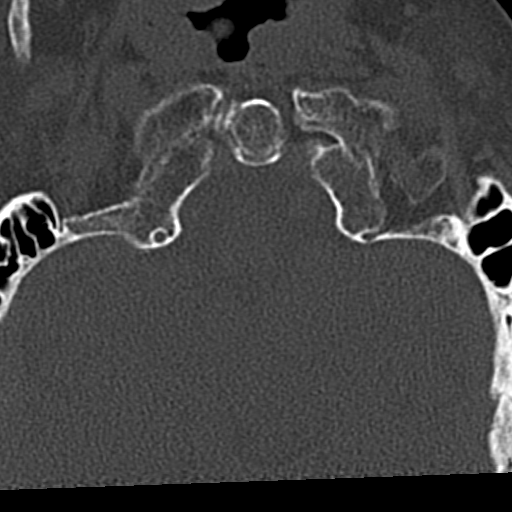

[10 of 29 positions shown; findings below may reference images not displayed]

FINDINGS: CT HEAD FINDINGS

Brain: No evidence of acute large vascular territory infarction,
hemorrhage, hydrocephalus, extra-axial collection or mass
lesion/mass effect. Similar patchy white matter and deep gray more
hypoattenuation, compatible with chronic microvascular ischemic
disease. Similar remote infarcts in the right cerebellum. Cerebral
atrophy.

Vascular: No hyperdense vessel identified. Calcific intracranial
atherosclerosis.

Skull: No acute fracture.

Sinuses/Orbits: Clear sinuses.  No acute orbital findings.

Other: No mastoid effusions

CT CERVICAL SPINE FINDINGS

Alignment: Similar alignment relative to the prior. Similar
levocurvature. Similar 5 mm of anterolisthesis of C4 on C5.

Skull base and vertebrae: No evidence of acute fracture. Congenital
anomalies of the craniocervical junction with incomplete
atlantoaxial segmentation and ankylosis. Chronically hypoplastic C1
arch with similar chronic severe canal stenosis.

Soft tissues and spinal canal: No prevertebral fluid or swelling. No
visible canal hematoma.

Disc levels: Severe chronic stenosis at the craniocervical junction,
as detailed above.Multilevel facet and uncovertebral hypertrophy
with varying degrees of neural foraminal stenosis that is greatest
at C3-C4.

Upper chest: Biapical pleuroparenchymal scarring. Otherwise,
visualized lung apices are clear.
IMPRESSION: CT head:

1. No evidence of acute intracranial abnormality.
2. Similar chronic microvascular disease.

CT cervical spine:

1. No evidence of acute fracture or traumatic malalignment.
2. Similar craniocervical segmentation anomalies with resulting
severe canal stenosis.

## 2022-10-08 ENCOUNTER — Encounter (HOSPITAL_BASED_OUTPATIENT_CLINIC_OR_DEPARTMENT_OTHER): Payer: Medicare Other | Attending: Internal Medicine | Admitting: Internal Medicine

## 2022-10-08 DIAGNOSIS — M1990 Unspecified osteoarthritis, unspecified site: Secondary | ICD-10-CM | POA: Diagnosis not present

## 2022-10-08 DIAGNOSIS — Z7901 Long term (current) use of anticoagulants: Secondary | ICD-10-CM | POA: Diagnosis not present

## 2022-10-08 DIAGNOSIS — L89154 Pressure ulcer of sacral region, stage 4: Secondary | ICD-10-CM | POA: Diagnosis not present

## 2022-10-08 DIAGNOSIS — I480 Paroxysmal atrial fibrillation: Secondary | ICD-10-CM | POA: Diagnosis not present

## 2022-10-08 DIAGNOSIS — G8222 Paraplegia, incomplete: Secondary | ICD-10-CM | POA: Insufficient documentation

## 2022-10-08 DIAGNOSIS — I10 Essential (primary) hypertension: Secondary | ICD-10-CM | POA: Diagnosis not present

## 2022-10-08 DIAGNOSIS — E440 Moderate protein-calorie malnutrition: Secondary | ICD-10-CM | POA: Diagnosis not present

## 2022-10-08 DIAGNOSIS — L89320 Pressure ulcer of left buttock, unstageable: Secondary | ICD-10-CM | POA: Diagnosis not present

## 2022-10-08 NOTE — Progress Notes (Signed)
Nancy Hayes ([id number]) [id number] Page 1 of 7 Visit Report for 10/08/2022 Chief Complaint Document Details Patient Name: Date of Service: Nancy Hayes 10/08/2022 12:45 PM Medical Record Number: [id number] Patient Account Number: [id number] Date of Birth/Sex: Treating RN: 04-17-1927 (89 y.o. F) Primary Care Provider: Bing Matter Other Clinician: Referring Provider: Treating Provider/Extender: Sabino Dick in Treatment: 4 Information Obtained from: Patient Chief Complaint 09/10/2022; sacral and left buttocks wound Electronic Signature(s) Signed: 10/08/2022 3:36:48 PM By: Kalman Shan DO Entered By: Kalman Shan on 10/08/2022 14:02:03 -------------------------------------------------------------------------------- HPI Details Patient Name: Date of Service: Nancy Hayes 10/08/2022 12:45 PM Medical Record Number: [id number] Patient Account Number: [id number] Date of Birth/Sex: Treating RN: 02-Jun-1927 (89 y.o. F) Primary Care Provider: Bing Matter Other Clinician: Referring Provider: Treating Provider/Extender: Sabino Dick in Treatment: 4 History of Present Illness HPI Description: 09/10/2022 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 She is currently bedridden and wheelchair- bound. She has followed up with neurosurgery but is not a surgical candidate. Other past medical history includes essential hypertension and paroxysmal A-fib On Eliquis. She presents today with 2 ulcers located to the sacrum and left buttocks that have been present for at least a month. It is documented in the neurology note 06/03/2022 that she has had bedsores at that time. She currently denies  systemic signs of infection. 2/6; Patient presents for follow up. Shes been using dakins wet to dry dressings. She has no issues or complaints today. She tries to offload the areas but requires a lot of assistance to do so. She denies signs of infection. She resides at [facility] facility. Electronic Signature(s) Signed: 10/08/2022 3:36:48 PM By: Kalman Shan DO Entered By: Kalman Shan on 10/08/2022 14:07:31 -------------------------------------------------------------------------------- Physical Exam Details Patient Name: Date of Service: Nancy Hayes 10/08/2022 12:45 PM Medical Record Number: [id number] Patient Account Number: [id number] Nancy Hayes, Nancy Hayes ([id number]) [phone].pdf Page 2 of 7 Date of Birth/Sex: Treating RN: October 21, 1926 (89 y.o. F) Primary Care Provider: Other Clinician: Bing Matter Referring Provider: Treating Provider/Extender: Sabino Dick in Treatment: 4 Constitutional respirations regular, non-labored and within target range for patient.Marland Kitchen Psychiatric pleasant and cooperative. Notes Sacrum: Granulation tissue Throughout with minimal undermining circumferentially. Left buttocks: Large open wound with Granulation tissue and nonviable tissue. Currently does not probe to bone. No signs of surrounding infection. Electronic Signature(s) Signed: 10/08/2022 3:36:48 PM By: Kalman Shan DO Entered By: Kalman Shan on 10/08/2022 14:08:33 -------------------------------------------------------------------------------- Physician Orders Details Patient Name: Date of Service: Nancy Hayes 10/08/2022 12:45 PM Medical Record Number: [id number] Patient Account Number: [id number] Date of Birth/Sex: Treating RN: Oct 11, 1926 (89 y.o. 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 - Dr. Heber Kulm Tuesday 1245 11/05/2022 *NO APPOINTMENT BETWEEN 11-12* Bathing/ Shower/ Hygiene Do not shower or bathe in tub. Off-Loading Low air-loss mattress (Group  2) - facility to ensure patient patient is currently using an air mattress. Turn and reposition every 2 hours Other: - continue using specialty wheelchair cushion. bunny boots to bilateral heels. Non Wound Condition Protect area with: - any bony prominences with border foam. Other Non Wound Condition Orders/Instructions: - any skin tears apply xeroform and bordered foam Wound Treatment Wound #1 - Sacrum Cleanser: Wound Cleanser 1 x Per Day/30 Days Discharge Instructions: Cleanse the wound with wound cleanser prior to applying a clean dressing using gauze sponges, not tissue or cotton balls. Prim Dressing: Dakin's Solution 0.25%, 16 (oz) 1 x Per Day/30 Days ary Discharge Instructions: Moisten gauze with Dakin's solution Nancy, Hayes ([id number]) [id number] Page 3 of 7 Secondary Dressing: Zetuvit Plus Silicone Border Dressing 7x7(in/in) 1 x Per Day/30 Days Discharge Instructions: Apply silicone border over primary dressing as directed. Wound #2 - Ischium Wound Laterality: Left Cleanser: Wound Cleanser 1 x Per Day/30 Days Discharge Instructions: Cleanse the wound with wound cleanser prior to applying a clean dressing using gauze sponges, not tissue or cotton balls. Prim Dressing: Dakin's Solution 0.25%, 16 (oz) 1 x Per Day/30  Days ary Discharge Instructions: Moisten gauze with Dakin's solution Secondary Dressing: Zetuvit Plus Silicone Border Dressing 7x7(in/in) 1 x Per Day/30 Days Discharge Instructions: Apply silicone border over primary dressing as directed. Electronic Signature(s) Signed: 10/08/2022 3:36:48 PM By: Kalman Shan DO Entered By: Kalman Shan on 10/08/2022 14:08:40 -------------------------------------------------------------------------------- Problem List Details Patient Name: Date of Service: Nancy Hayes 10/08/2022 12:45 PM Medical Record Number: [id number] Patient Account Number: [id number] Date of Birth/Sex: Treating RN: 10-Dec-1926 (89 y.o. 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, incomplete 09/10/2022 No Yes M48.00 Spinal stenosis, site unspecified 09/10/2022 No Yes E44.0 Moderate protein-calorie malnutrition 09/10/2022 No Yes I48.0 Paroxysmal atrial fibrillation 09/10/2022 No Yes Nancy Hayes, Nancy Hayes ([id number]) [id number] Page 4 of 7 Inactive Problems Resolved Problems Electronic Signature(s) Signed: 10/08/2022 3:36:48 PM By: Kalman Shan DO Entered By: Kalman Shan on 10/08/2022 14:01:51 -------------------------------------------------------------------------------- Progress Note Details Patient Name: Date of Service: Nancy Hayes 10/08/2022 12:45 PM Medical Record Number: [id number] Patient Account Number: [id number] Date of Birth/Sex: Treating RN: March 04, 1927 (89 y.o.  F) Primary Care Provider: Bing Matter Other Clinician: Referring Provider: Treating Provider/Extender: Sabino Dick in Treatment: 4 Subjective Chief Complaint Information obtained from Patient 09/10/2022; sacral and left buttocks wound History of Present Illness (HPI) 09/10/2022 Ms. 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 She is currently bedridden and wheelchair- bound. She has followed up with neurosurgery but is not a surgical candidate. Other past medical history includes essential hypertension and paroxysmal A-fib On Eliquis. She presents today with 2 ulcers located to the sacrum and left buttocks that have been present for at least a month. It is documented in the neurology note 06/03/2022 that she has had bedsores at that time. She currently denies systemic signs of infection. 2/6; Patient presents for follow up. Shes been  using dakins wet to dry dressings. She has no issues or complaints today. She tries to offload the areas but requires a lot of assistance to do so. She denies signs of infection. She resides at [facility] facility. Patient History Information obtained from Patient, Chart. Family History Unknown History. Social History Never smoker, Marital Status - Widowed, Alcohol Use - Never, Drug Use - No History, Caffeine Use - Rarely. Medical History Cardiovascular Patient has history of Arrhythmia - A fibb, Hypertension Denies history of Congestive Heart Failure, Coronary Artery Disease, Deep Vein Thrombosis, Hypotension, Myocardial Infarction, Peripheral Arterial Disease, Peripheral Venous Disease, Phlebitis, Vasculitis Musculoskeletal Patient has history of Osteoarthritis Neurologic Patient has history of  Paraplegia Denies history of Dementia, Neuropathy, Quadriplegia Hospitalization/Surgery History - EGD with propfol. - balloon dilation. - extracorporeal shock wave lithotripsy. - cataract extraction. - partial hysterectomy but pt. states total. Medical A Surgical History Notes nd Neurologic resteless leg syndrome Objective Nancy Hayes, Nancy Hayes ([id number]) [id number] Page 5 of 7 Constitutional respirations regular, non-labored and within target range for patient.. Vitals Time Taken: 1:25 PM, Pulse: 81 bpm, Respiratory Rate: 16 breaths/min, Blood Pressure: 97/66 mmHg. Psychiatric pleasant and cooperative. General Notes: Sacrum: Granulation tissue Throughout with minimal undermining circumferentially. Left buttocks: Large open wound with Granulation tissue and nonviable tissue. Currently does not probe to bone. No signs of surrounding infection. Integumentary (Hair, Skin) Wound #1 status is Open. Original cause of wound was Pressure Injury. The date acquired was: 09/02/2021. The wound has been in treatment 4 weeks. The wound is located on the Sacrum. The wound measures 4cm length x 3cm width x 0.8cm depth; 9.425cm^2 area and 7.54cm^3 volume. There is Fat Layer (Subcutaneous Tissue) exposed. There is no tunneling or undermining noted. There is a large amount of serosanguineous drainage noted. Foul odor after cleansing was noted. The wound margin is distinct with the outline attached to the wound base. There is large (67-100%) red, pink granulation within the wound bed. There is a small (1-33%) amount of necrotic tissue within the wound bed including Eschar and Adherent Slough. The periwound skin appearance did not exhibit: Callus, Crepitus, Excoriation, Induration, Rash, Scarring, Dry/Scaly, Maceration, Atrophie Blanche, Cyanosis, Ecchymosis, Hemosiderin Staining, Mottled, Pallor, Rubor, Erythema. Periwound temperature was noted as No Abnormality. The periwound has  tenderness on palpation. Wound #2 status is Open. Original cause of wound was Pressure Injury. The date acquired was: 09/02/2021. The wound has been in treatment 4 weeks. The wound is located on the Left Ischium. The wound measures 2.6cm length x 3.5cm width x 1cm depth; 7.147cm^2 area and 7.147cm^3 volume. There is no tunneling or undermining noted. There is a large amount of serosanguineous drainage noted. The wound margin is distinct with the outline attached to the wound base. There is small (1-33%) granulation within the wound bed. There is a large (67-100%) amount of necrotic tissue within the wound bed including Eschar and Adherent Slough. The periwound skin appearance did not exhibit: Callus, Crepitus, Excoriation, Induration, Rash, Scarring, Dry/Scaly, Maceration, Atrophie Blanche, Cyanosis, Ecchymosis, Hemosiderin Staining, Mottled, Pallor, Rubor, Erythema. Periwound temperature was noted as No Abnormality. The periwound has tenderness on palpation. Assessment Active Problems ICD-10 Pressure ulcer of sacral region, stage 4 Pressure ulcer of left buttock, unstageable Essential (primary) hypertension Long term (current) use of anticoagulants Paraplegia, incomplete Spinal stenosis, site unspecified Moderate protein-calorie malnutrition Paroxysmal atrial fibrillation Patient's wounds have shown improvement in healing over the past month. Patient sacral wound has granulation tissue throughout. T the left buttocks wound o there is still  some nonviable tissue present. Can continue Dakin's wet-to-dry dressings to the wound beds. Continue aggressive offloading. No probing to bone currently. Follow-up in 1 month. Plan Follow-up Appointments: Return appointment in 1 month. - Dr. Heber Davenport Tuesday 1245 11/05/2022 *NO APPOINTMENT BETWEEN 11-12* Bathing/ Shower/ Hygiene: Do not shower or bathe in tub. Off-Loading: Low air-loss mattress (Group 2) - facility to ensure patient patient is currently  using an air mattress. Turn and reposition every 2 hours Other: - continue using specialty wheelchair cushion. bunny boots to bilateral heels. Non Wound Condition: Protect area with: - any bony prominences with border foam. Other Non Wound Condition Orders/Instructions: - any skin tears apply xeroform and bordered foam WOUND #1: - Sacrum Wound Laterality: Cleanser: Wound Cleanser 1 x Per Day/30 Days Discharge Instructions: Cleanse the wound with wound cleanser prior to applying a clean dressing using gauze sponges, not tissue or cotton balls. Prim Dressing: Dakin's Solution 0.25%, 16 (oz) 1 x Per Day/30 Days ary Discharge Instructions: Moisten gauze with Dakin's solution Secondary Dressing: Zetuvit Plus Silicone Border Dressing 7x7(in/in) 1 x Per Day/30 Days Discharge Instructions: Apply silicone border over primary dressing as directed. WOUND #2: - Ischium Wound Laterality: Left Cleanser: Wound Cleanser 1 x Per Day/30 Days Discharge Instructions: Cleanse the wound with wound cleanser prior to applying a clean dressing using gauze sponges, not tissue or cotton balls. Prim Dressing: Dakin's Solution 0.25%, 16 (oz) 1 x Per Day/30 Days ary Discharge Instructions: Moisten gauze with Dakin's solution Secondary Dressing: Zetuvit Plus Silicone Border Dressing 7x7(in/in) 1 x Per Day/30 Days Discharge Instructions: Apply silicone border over primary dressing as directed. 1. Dakin's wet-to-dry dressings 2. Aggressive offloading Nancy Hayes, Nancy Hayes ([id number]) [id number] Page 6 of 7 3. Follow-up in 1 month Electronic Signature(s) Signed: 10/08/2022 3:36:48 PM By: Kalman Shan DO Entered By: Kalman Shan on 10/08/2022 14:16:40 -------------------------------------------------------------------------------- HxROS Details Patient Name: Date of Service: Nancy Hayes. 10/08/2022 12:45 PM Medical Record Number: [id number] Patient Account Number: [id number] Date of  Birth/Sex: Treating RN: 1927/04/16 (89 y.o. F) Primary Care Provider: Bing Matter Other Clinician: Referring Provider: Treating Provider/Extender: Sabino Dick in Treatment: 4 Information Obtained From Patient Chart Cardiovascular Medical History: Positive for: Arrhythmia - A fibb; Hypertension Negative for: Congestive Heart Failure; Coronary Artery Disease; Deep Vein Thrombosis; Hypotension; Myocardial Infarction; Peripheral Arterial Disease; Peripheral Venous Disease; Phlebitis; Vasculitis Musculoskeletal Medical History: Positive for: Osteoarthritis Neurologic Medical History: Positive for: Paraplegia Negative for: Dementia; Neuropathy; Quadriplegia Past Medical History Notes: resteless leg syndrome Immunizations Pneumococcal Vaccine: Received Pneumococcal Vaccination: Yes Received Pneumococcal Vaccination On or After 60th Birthday: Yes Implantable Devices None Hospitalization / Surgery History Type of Hospitalization/Surgery EGD with propfol balloon dilation extracorporeal shock wave lithotripsy cataract extraction partial hysterectomy but pt. states total Family and Social History Unknown History: Yes; Never smoker; Marital Status - Widowed; Alcohol Use: Never; Drug Use: No History; Caffeine Use: Rarely; Financial Concerns: No; Food, Clothing or Shelter Needs: No; Support System Lacking: No; Transportation Concerns: No Electronic Signature(s) Signed: 10/08/2022 3:36:48 PM By: Kalman Shan DO Entered By: Kalman Shan on 10/08/2022 14:08:18 Nancy Hayes ([id number]) [id number] Page 7 of 7 -------------------------------------------------------------------------------- SuperBill Details Patient Name: Date of Service: Nancy Hayes 10/08/2022 Medical Record Number: [id number] Patient Account Number: [id number] Date of Birth/Sex: Treating RN: 07/28/1927 (89 y.o. 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, incomplete M48.00 Spinal stenosis, site unspecified E44.0 Moderate protein-calorie malnutrition I48.0 Paroxysmal atrial fibrillation Facility Procedures : CPT4 Code: [id number] Description: 99214 - WOUND CARE VISIT-LEV 4 EST PT Modifier: Quantity: 1 Physician Procedures : CPT4 Code Description Modifier [id number] 18335 - WC PHYS LEVEL 3 - EST PT ICD-10 Diagnosis Description L89.154 Pressure ulcer of sacral region, stage 4 L89.320 Pressure ulcer of left buttock, unstageable E44.0 Moderate protein-calorie malnutrition G82.22  Paraplegia, incomplete Quantity: 1 Electronic Signature(s) Signed: 10/08/2022 6:17:04 PM By: Deon Pilling RN, BSN Signed: 10/09/2022 9:06:34 AM By: Kalman Shan DO Previous Signature: 10/08/2022 3:36:48 PM Version By: Kalman Shan DO Entered By: Deon Pilling on 10/08/2022 16:05:40

## 2022-10-09 NOTE — Progress Notes (Signed)
FALICIA, LIZOTTE ([id number]) [id number] Page 1 of 10 Visit Report for 10/08/2022 Arrival Information Details Patient Name: Date of Service: Nancy Hayes 10/08/2022 12:45 PM Medical Record Number: [id number] Patient Account Number: [id number] Date of Birth/Sex: Treating RN: May 08, 1927 (89 y.o. F) Primary Care Lamonica Trueba: Bing Matter Other Clinician: Referring Kazuki Ingle: Treating Aneesh Faller/Extender: Sabino Dick in Treatment: 4 Visit Information History Since Last Visit Added or deleted any medications: No Patient Arrived: Wheel Chair Any new allergies or adverse reactions: No Arrival Time: 13:25 Had a fall or experienced change in No Accompanied By: son activities of daily living that may affect Transfer Assistance: Harrel Lemon Lift risk of falls: Patient Identification Verified: Yes Signs or symptoms of abuse/neglect since last visito No Secondary Verification Process Completed: Yes Hospitalized since last visit: No Patient Requires Transmission-Based Precautions: No Implantable device outside of the clinic excluding No Patient Has Alerts: No cellular tissue based products placed in the center since last visit: Has Dressing in Place as Prescribed: Yes Pain Present Now: Yes Electronic Signature(s) Signed: 10/09/2022 4:14:43 PM By: Erenest Blank Entered By: Erenest Blank on 10/08/2022 13:25:43 -------------------------------------------------------------------------------- Clinic Level of Care Assessment Details Patient Name: Date of Service: Nancy Hayes 10/08/2022 12:45 PM Medical Record Number: [id number] Patient Account Number: [id number] Date of Birth/Sex: Treating RN: [birth year] (89 y.o. Nancy Hayes Primary Care Letasha Kershaw: Bing Matter Other Clinician: Referring Donnavin Vandenbrink: Treating Keeton Kassebaum/Extender: Sabino Dick in Treatment: 4 Clinic Level of Care Assessment Items TOOL 4 Quantity  Score X- 1 0 Use when only an EandM is performed on FOLLOW-UP visit ASSESSMENTS - Nursing Assessment / Reassessment X- 1 10 Reassessment of Co-morbidities (includes updates in patient status) X- 1 5 Reassessment of Adherence to Treatment Plan ASSESSMENTS - Wound and Skin A ssessment / Reassessment '[]'$  - 0 Simple Wound Assessment / Reassessment - one wound X- 2 5 Complex Wound Assessment / Reassessment - multiple wounds X- 1 10 Dermatologic / Skin Assessment (not related to wound area) ASSESSMENTS - Focused Assessment '[]'$  - 0 Circumferential Edema Measurements - multi extremities X- 1 10 Nutritional Assessment / Counseling / Intervention Nancy Hayes, Nancy Hayes ([id number]) [id number] Page 2 of 10 '[]'$  - 0 Lower Extremity Assessment (monofilament, tuning fork, pulses) '[]'$  - 0 Peripheral Arterial Disease Assessment (using hand held doppler) ASSESSMENTS - Ostomy and/or Continence Assessment and Care '[]'$  - 0 Incontinence Assessment and Management '[]'$  - 0 Ostomy Care Assessment and Management (repouching, etc.) PROCESS - Coordination of Care '[]'$  - 0 Simple Patient / Family Education for ongoing care X- 1 20 Complex (extensive) Patient / Family Education for ongoing care X- 1 10 Staff obtains Programmer, systems, Records, T Results / Process Orders est '[]'$  - 0 Staff telephones HHA, Nursing Homes / Clarify orders / etc '[]'$  - 0 Routine Transfer to another Facility (non-emergent condition) '[]'$  - 0 Routine Hospital Admission (non-emergent condition) '[]'$  - 0 New Admissions / Biomedical engineer / Ordering NPWT Apligraf, etc. , '[]'$  - 0 Emergency Hospital Admission (emergent condition) '[]'$  - 0 Simple Discharge Coordination X- 1 15 Complex (extensive) Discharge Coordination PROCESS - Special Needs '[]'$  - 0 Pediatric / Minor Patient Management '[]'$  - 0 Isolation Patient Management '[]'$  - 0 Hearing / Language / Visual special needs '[]'$  - 0 Assessment of Community assistance  (transportation, D/C planning, etc.) '[]'$  - 0 Additional assistance / Altered mentation '[]'$  - 0 Support Surface(s) Assessment (bed, cushion, seat, etc.) INTERVENTIONS - Wound Cleansing / Measurement '[]'$  - 0 Simple Wound Cleansing -  one wound X- 2 5 Complex Wound Cleansing - multiple wounds X- 1 5 Wound Imaging (photographs - any number of wounds) '[]'$  - 0 Wound Tracing (instead of photographs) '[]'$  - 0 Simple Wound Measurement - one wound X- 2 5 Complex Wound Measurement - multiple wounds INTERVENTIONS - Wound Dressings X - Small Wound Dressing one or multiple wounds 2 10 '[]'$  - 0 Medium Wound Dressing one or multiple wounds '[]'$  - 0 Large Wound Dressing one or multiple wounds '[]'$  - 0 Application of Medications - topical '[]'$  - 0 Application of Medications - injection INTERVENTIONS - Miscellaneous '[]'$  - 0 External ear exam '[]'$  - 0 Specimen Collection (cultures, biopsies, blood, body fluids, etc.) '[]'$  - 0 Specimen(s) / Culture(s) sent or taken to Lab for analysis X- 1 10 Patient Transfer (multiple staff / Civil Service fast streamer / Similar devices) '[]'$  - 0 Simple Staple / Suture removal (25 or less) '[]'$  - 0 Complex Staple / Suture removal (26 or more) '[]'$  - 0 Hypo / Hyperglycemic Management (close monitor of Blood Glucose) Nancy Hayes, Nancy Hayes ([id number]) [phone].pdf Page 3 of 10 '[]'$  - 0 Ankle / Brachial Index (ABI) - do not check if billed separately X- 1 5 Vital Signs Has the patient been seen at the hospital within the last three years: Yes Total Score: 150 Level Of Care: New/Established - Level 4 Electronic Signature(s) Signed: 10/08/2022 6:17:04 PM By: Deon Pilling RN, BSN Entered By: Deon Pilling on 10/08/2022 16:05:33 -------------------------------------------------------------------------------- Encounter Discharge Information Details Patient Name: Date of Service: Nancy Hayes. 10/08/2022 12:45 PM Medical Record Number: [id number] Patient Account Number:  [id number] Date of Birth/Sex: Treating RN: June 28, 1927 (89 y.o. Nancy Hayes Primary Care Shelbia Scinto: Bing Matter Other Clinician: Referring Kaynan Klonowski: Treating Tonisha Silvey/Extender: Sabino Dick in Treatment: 4 Encounter Discharge Information Items Discharge Condition: Stable Ambulatory Status: Wheelchair Discharge Destination: Skilled Nursing Facility Telephoned: No Orders Sent: Yes Transportation: Private Auto Accompanied By: son Schedule Follow-up Appointment: Yes Clinical Summary of Care: Electronic Signature(s) Signed: 10/08/2022 6:17:04 PM By: Deon Pilling RN, BSN Entered By: Deon Pilling on 10/08/2022 16:08:22 -------------------------------------------------------------------------------- Lower Extremity Assessment Details Patient Name: Date of Service: Nancy Hayes 10/08/2022 12:45 PM Medical Record Number: [id number] Patient Account Number: [id number] Date of Birth/Sex: Treating RN: Jul 11, 1927 (89 y.o. F) Primary Care Mardell Suttles: Bing Matter Other Clinician: Referring Treyshaun Keatts: Treating Jeet Shough/Extender: Truddie Hidden Weeks in Treatment: 4 Electronic Signature(s) Signed: 10/09/2022 4:14:43 PM By: Erenest Blank Entered By: Erenest Blank on 10/08/2022 13:26:25 Multi Wound Chart Details -------------------------------------------------------------------------------- Nancy Hayes ([id number]) [id number] Page 4 of 10 Patient Name: Date of Service: Nancy Hayes 10/08/2022 12:45 PM Medical Record Number: [id number] Patient Account Number: [id number] Date of Birth/Sex: Treating RN: 03-01-1927 (89 y.o. F) Primary Care Germaine Shenker: Bing Matter Other Clinician: Referring Najae Rathert: Treating Eryka Dolinger/Extender: Sabino Dick in Treatment: 4 Vital Signs Height(in): Pulse(bpm): 21 Weight(lbs): Blood Pressure(mmHg): 97/66 Body Mass  Index(BMI): Temperature(F): Respiratory Rate(breaths/min): 16 Wound Assessments Wound Number: 1 2 N/A Photos: N/A Sacrum Left Ischium N/A Wound Location: Pressure Injury Pressure Injury N/A Wounding Event: Pressure Ulcer Pressure Ulcer N/A Primary Etiology: Arrhythmia, Hypertension, Arrhythmia, Hypertension, N/A Comorbid History: Osteoarthritis, Paraplegia Osteoarthritis, Paraplegia 09/02/2021 09/02/2021 N/A Date Acquired: 4 4 N/A Weeks of Treatment: Open Open N/A Wound Status: No No N/A Wound Recurrence: 4x3x0.8 2.6x3.5x1 N/A Measurements L x W x D (cm) 9.425 7.147 N/A A (cm) : rea 7.54 7.147 N/A Volume (cm) : 27.90% 85.30% N/A % Reduction in A  rea: 58.80% 92.60% N/A % Reduction in Volume: Category/Stage IV Unstageable/Unclassified N/A Classification: Large Large N/A Exudate A mount: Serosanguineous Serosanguineous N/A Exudate Type: red, brown red, brown N/A Exudate Color: Yes No N/A Foul Odor A Cleansing: fter No N/A N/A Odor A nticipated Due to Product Use: Distinct, outline attached Distinct, outline attached N/A Wound Margin: Large (67-100%) Small (1-33%) N/A Granulation A mount: Red, Pink N/A N/A Granulation Quality: Small (1-33%) Large (67-100%) N/A Necrotic A mount: Eschar, Adherent Slough Eschar, Adherent Slough N/A Necrotic Tissue: Fat Layer (Subcutaneous Tissue): Yes Fascia: No N/A Exposed Structures: Fascia: No Fat Layer (Subcutaneous Tissue): No Tendon: No Tendon: No Muscle: No Muscle: No Joint: No Joint: No Bone: No Bone: No None None N/A Epithelialization: Excoriation: No Excoriation: No N/A Periwound Skin Texture: Induration: No Induration: No Callus: No Callus: No Crepitus: No Crepitus: No Rash: No Rash: No Scarring: No Scarring: No Maceration: No Maceration: No N/A Periwound Skin Moisture: Dry/Scaly: No Dry/Scaly: No Atrophie Blanche: No Atrophie Blanche: No N/A Periwound Skin Color: Cyanosis: No Cyanosis:  No Ecchymosis: No Ecchymosis: No Erythema: No Erythema: No Hemosiderin Staining: No Hemosiderin Staining: No Mottled: No Mottled: No Pallor: No Pallor: No Rubor: No Rubor: No No Abnormality No Abnormality N/A Temperature: Yes Yes N/A Tenderness on Palpation: Treatment Notes Electronic Signature(s) Nancy Hayes, Nancy Hayes ([id number]) [id number] Page 5 of 10 Signed: 10/08/2022 3:36:48 PM By: Kalman Shan DO Entered By: Kalman Shan on 10/08/2022 14:01:55 -------------------------------------------------------------------------------- Multi-Disciplinary Care Plan Details Patient Name: Date of Service: Nancy Hayes 10/08/2022 12:45 PM Medical Record Number: [id number] Patient Account Number: [id number] Date of Birth/Sex: Treating RN: 10/18/1926 (89 y.o. Helene Shoe, Tammi Klippel Primary Care Amarion Portell: Bing Matter Other Clinician: Referring Jozy Mcphearson: Treating Vittoria Noreen/Extender: Sabino Dick in Treatment: 4 Active Inactive Nutrition Nursing Diagnoses: Imbalanced nutrition Potential for alteratiion in Nutrition/Potential for imbalanced nutrition Goals: Patient/caregiver agrees to and verbalizes understanding of need to obtain nutritional consultation Date Initiated: 09/10/2022 Target Resolution Date: 11/29/2022 Goal Status: Active Patient/caregiver agrees to and verbalizes understanding of need to use nutritional supplements and/or vitamins as prescribed Date Initiated: 09/10/2022 Target Resolution Date: 11/29/2022 Goal Status: Active Interventions: Provide education on nutrition Treatment Activities: Dietary management education, guidance and counseling : 09/10/2022 Patient referred to Primary Care Physician for further nutritional evaluation : 09/10/2022 Notes: Osteomyelitis Nursing Diagnoses: Infection: osteomyelitis Knowledge deficit related to disease process and management Goals: Diagnostic evaluation for osteomyelitis  completed as ordered Date Initiated: 09/10/2022 Target Resolution Date: 11/28/2022 Goal Status: Active Signs and symptoms for osteomyelitis will be recognized and promptly addressed Date Initiated: 09/10/2022 Target Resolution Date: 11/29/2022 Goal Status: Active Interventions: Assess for signs and symptoms of osteomyelitis resolution every visit Provide education on osteomyelitis Treatment Activities: Surgical debridement : 09/10/2022 Systemic antibiotics : 09/10/2022 Notes: Pressure Nursing Diagnoses: Knowledge deficit related to causes and risk factors for pressure ulcer development Knowledge deficit related to management of pressures ulcers Nancy Hayes, Nancy Hayes ([id number]) [id number] Page 6 of 10 Goals: Patient/caregiver will verbalize risk factors for pressure ulcer development Date Initiated: 09/10/2022 Target Resolution Date: 11/28/2022 Goal Status: Active Patient/caregiver will verbalize understanding of pressure ulcer management Date Initiated: 09/10/2022 Target Resolution Date: 11/29/2022 Goal Status: Active Interventions: Assess: immobility, friction, shearing, incontinence upon admission and as needed Assess offloading mechanisms upon admission and as needed Assess potential for pressure ulcer upon admission and as needed Provide education on pressure ulcers Notes: Electronic Signature(s) Signed: 10/08/2022 6:17:04 PM By: Deon Pilling RN, BSN Entered By: Deon Pilling on 10/08/2022 15:55:14 -------------------------------------------------------------------------------- Pain Assessment  Details Patient Name: Date of Service: Nancy Hayes 10/08/2022 12:45 PM Medical Record Number: [id number] Patient Account Number: [id number] Date of Birth/Sex: Treating RN: 1927-05-17 (89 y.o. F) Primary Care Joniyah Mallinger: Bing Matter Other Clinician: Referring Damontre Millea: Treating Buckley Bradly/Extender: Sabino Dick in Treatment: 4 Active  Problems Location of Pain Severity and Description of Pain Patient Has Paino Yes Site Locations Pain Location: Generalized Pain Rate the pain. Current Pain Level: 6 Pain Management and Medication Current Pain Management: Electronic Signature(s) Signed: 10/09/2022 4:14:43 PM By: Erenest Blank Entered By: Erenest Blank on 10/08/2022 13:26:18 Nancy Hayes ([id number]) [id number] Page 7 of 10 -------------------------------------------------------------------------------- Patient/Caregiver Education Details Patient Name: Date of Service: Nancy Hayes 2/6/2024andnbsp12:45 PM Medical Record Number: [id number] Patient Account Number: [id number] Date of Birth/Gender: Treating RN: [birth year] (89 y.o. Nancy Hayes Primary Care Physician: Bing Matter Other Clinician: Referring Physician: Treating Physician/Extender: Sabino Dick in Treatment: 4 Education Assessment Education Provided To: Caregiver son Education Topics Provided Wound/Skin Impairment: Handouts: Caring for Your Ulcer Methods: Explain/Verbal Responses: Reinforcements needed Electronic Signature(s) Signed: 10/08/2022 6:17:04 PM By: Deon Pilling RN, BSN Entered By: Deon Pilling on 10/08/2022 16:04:46 -------------------------------------------------------------------------------- Wound Assessment Details Patient Name: Date of Service: Nancy Hayes 10/08/2022 12:45 PM Medical Record Number: [id number] Patient Account Number: [id number] Date of Birth/Sex: Treating RN: [birth year] (89 y.o. F) Primary Care Cing Hallam: Bing Matter Other Clinician: Referring Alyissa Whidbee: Treating Remie Mathison/Extender: Truddie Hidden Weeks in Treatment: 4 Wound Status Wound Number: 1 Primary Etiology: Pressure Ulcer Wound Location: Sacrum Wound Status: Open Wounding Event: Pressure Injury Comorbid History: Arrhythmia, Hypertension, Osteoarthritis,  Paraplegia Date Acquired: 09/02/2021 Weeks Of Treatment: 4 Clustered Wound: No Photos Wound Measurements Length: (cm) 4 Hayes, Nancy G ([id number]) Width: (cm) 3 Depth: (cm) 0 Area: (cm) Volume: (cm) % Reduction in Area: 27.9% [id number] Page 8 of 10 % Reduction in Volume: 58.8% .8 Epithelialization: None 9.425 Tunneling: No 7.54 Undermining: No Wound Description Classification: Category/Stage IV Wound Margin: Distinct, outline attached Exudate Amount: Large Exudate Type: Serosanguineous Exudate Color: red, brown Foul Odor After Cleansing: Yes Due to Product Use: No Slough/Fibrino Yes Wound Bed Granulation Amount: Large (67-100%) Exposed Structure Granulation Quality: Red, Pink Fascia Exposed: No Necrotic Amount: Small (1-33%) Fat Layer (Subcutaneous Tissue) Exposed: Yes Necrotic Quality: Eschar, Adherent Slough Tendon Exposed: No Muscle Exposed: No Joint Exposed: No Bone Exposed: No Periwound Skin Texture Texture Color No Abnormalities Noted: No No Abnormalities Noted: No Callus: No Atrophie Blanche: No Crepitus: No Cyanosis: No Excoriation: No Ecchymosis: No Induration: No Erythema: No Rash: No Hemosiderin Staining: No Scarring: No Mottled: No Pallor: No Moisture Rubor: No No Abnormalities Noted: No Dry / Scaly: No Temperature / Pain Maceration: No Temperature: No Abnormality Tenderness on Palpation: Yes Treatment Notes Wound #1 (Sacrum) Cleanser Wound Cleanser Discharge Instruction: Cleanse the wound with wound cleanser prior to applying a clean dressing using gauze sponges, not tissue or cotton balls. Peri-Wound Care Topical Primary Dressing Dakin's Solution 0.25%, 16 (oz) Discharge Instruction: Moisten gauze with Dakin's solution Secondary Dressing Zetuvit Plus Silicone Border Dressing 7x7(in/in) Discharge Instruction: Apply silicone border over primary dressing as directed. Secured With Compression  Wrap Compression Stockings Environmental education officer) Signed: 10/09/2022 4:14:43 PM By: Erenest Blank Entered By: Erenest Blank on 10/08/2022 13:48:17 Nancy Hayes ([id number]) [id number] Page 9 of 10 -------------------------------------------------------------------------------- Wound Assessment Details Patient Name: Date of Service: Nancy Hayes 10/08/2022 12:45 PM Medical Record Number: [id number] Patient Account Number: [id number] Date of  Birth/Sex: Treating RN: 23-Sep-1926 (89 y.o. F) Primary Care Danielys Madry: Bing Matter Other Clinician: Referring Shavelle Runkel: Treating Eldred Lievanos/Extender: Truddie Hidden Weeks in Treatment: 4 Wound Status Wound Number: 2 Primary Etiology: Pressure Ulcer Wound Location: Left Ischium Wound Status: Open Wounding Event: Pressure Injury Comorbid History: Arrhythmia, Hypertension, Osteoarthritis, Paraplegia Date Acquired: 09/02/2021 Weeks Of Treatment: 4 Clustered Wound: No Photos Wound Measurements Length: (cm) 2 Width: (cm) 3 Depth: (cm) 1 Area: (cm) Volume: (cm) .6 % Reduction in Area: 85.3% .5 % Reduction in Volume: 92.6% Epithelialization: None 7.147 Tunneling: No 7.147 Undermining: No Wound Description Classification: Unstageable/Unclassified Wound Margin: Distinct, outline attached Exudate Amount: Large Exudate Type: Serosanguineous Exudate Color: red, brown Foul Odor After Cleansing: No Slough/Fibrino Yes Wound Bed Granulation Amount: Small (1-33%) Exposed Structure Necrotic Amount: Large (67-100%) Fascia Exposed: No Necrotic Quality: Eschar, Adherent Slough Fat Layer (Subcutaneous Tissue) Exposed: No Tendon Exposed: No Muscle Exposed: No Joint Exposed: No Bone Exposed: No Periwound Skin Texture Texture Color No Abnormalities Noted: No No Abnormalities Noted: No Callus: No Atrophie Blanche: No Crepitus: No Cyanosis: No Excoriation: No Ecchymosis:  No Induration: No Erythema: No Rash: No Hemosiderin Staining: No Scarring: No Mottled: No Pallor: No Moisture Rubor: No No Abnormalities Noted: No Dry / Scaly: No Temperature / Pain Maceration: No Temperature: No Abnormality Tenderness on Palpation: Yes Treatment Notes Wound #2 (Ischium) Wound Laterality: Left Nancy Hayes, Nancy Hayes ([id number]) [id number] Page 10 of 10 Cleanser Wound Cleanser Discharge Instruction: Cleanse the wound with wound cleanser prior to applying a clean dressing using gauze sponges, not tissue or cotton balls. Peri-Wound Care Topical Primary Dressing Dakin's Solution 0.25%, 16 (oz) Discharge Instruction: Moisten gauze with Dakin's solution Secondary Dressing Zetuvit Plus Silicone Border Dressing 7x7(in/in) Discharge Instruction: Apply silicone border over primary dressing as directed. Secured With Compression Wrap Compression Stockings Environmental education officer) Signed: 10/09/2022 4:14:43 PM By: Erenest Blank Entered By: Erenest Blank on 10/08/2022 13:48:53 -------------------------------------------------------------------------------- Vitals Details Patient Name: Date of Service: Nancy Hayes. 10/08/2022 12:45 PM Medical Record Number: [id number] Patient Account Number: [id number] Date of Birth/Sex: Treating RN: February 20, 1927 (89 y.o. F) Primary Care Devin Foskey: Bing Matter Other Clinician: Referring Onaje Warne: Treating Carleena Mires/Extender: Truddie Hidden Weeks in Treatment: 4 Vital Signs Time Taken: 13:25 Pulse (bpm): 81 Respiratory Rate (breaths/min): 16 Blood Pressure (mmHg): 97/66 Reference Range: 80 - 120 mg / dl Electronic Signature(s) Signed: 10/09/2022 4:14:43 PM By: Erenest Blank Entered By: Erenest Blank on 10/08/2022 13:26:07

## 2022-10-24 ENCOUNTER — Other Ambulatory Visit: Payer: Self-pay | Admitting: Neurology

## 2022-10-29 ENCOUNTER — Other Ambulatory Visit: Payer: Self-pay

## 2022-10-29 MED ORDER — GABAPENTIN 100 MG PO CAPS: 100.0000 mg | ORAL_CAPSULE | Freq: Two times a day (BID) | ORAL | 3 refills | Status: DC

## 2022-11-05 ENCOUNTER — Encounter (HOSPITAL_BASED_OUTPATIENT_CLINIC_OR_DEPARTMENT_OTHER): Payer: Medicare Other | Attending: Internal Medicine | Admitting: Internal Medicine

## 2022-11-05 DIAGNOSIS — S81801A Unspecified open wound, right lower leg, initial encounter: Secondary | ICD-10-CM | POA: Diagnosis not present

## 2022-11-05 DIAGNOSIS — M4800 Spinal stenosis, site unspecified: Secondary | ICD-10-CM | POA: Diagnosis not present

## 2022-11-05 DIAGNOSIS — L89320 Pressure ulcer of left buttock, unstageable: Secondary | ICD-10-CM | POA: Insufficient documentation

## 2022-11-05 DIAGNOSIS — L89154 Pressure ulcer of sacral region, stage 4: Secondary | ICD-10-CM | POA: Insufficient documentation

## 2022-11-05 DIAGNOSIS — S91301A Unspecified open wound, right foot, initial encounter: Secondary | ICD-10-CM | POA: Diagnosis not present

## 2022-11-05 DIAGNOSIS — G8222 Paraplegia, incomplete: Secondary | ICD-10-CM | POA: Insufficient documentation

## 2022-11-05 DIAGNOSIS — X58XXXA Exposure to other specified factors, initial encounter: Secondary | ICD-10-CM | POA: Diagnosis not present

## 2022-11-05 DIAGNOSIS — Z993 Dependence on wheelchair: Secondary | ICD-10-CM | POA: Diagnosis not present

## 2022-11-05 DIAGNOSIS — Z7901 Long term (current) use of anticoagulants: Secondary | ICD-10-CM | POA: Insufficient documentation

## 2022-11-05 DIAGNOSIS — I10 Essential (primary) hypertension: Secondary | ICD-10-CM | POA: Insufficient documentation

## 2022-11-05 DIAGNOSIS — E440 Moderate protein-calorie malnutrition: Secondary | ICD-10-CM | POA: Diagnosis not present

## 2022-11-05 DIAGNOSIS — I480 Paroxysmal atrial fibrillation: Secondary | ICD-10-CM | POA: Insufficient documentation

## 2022-11-05 DIAGNOSIS — L89890 Pressure ulcer of other site, unstageable: Secondary | ICD-10-CM | POA: Insufficient documentation

## 2022-11-05 DIAGNOSIS — S91302A Unspecified open wound, left foot, initial encounter: Secondary | ICD-10-CM | POA: Diagnosis not present

## 2022-11-06 NOTE — Progress Notes (Addendum)
ZINAB, DEBACCO ([id number]) [id number] Page 1 of 9 Visit Report for 11/05/2022 Chief Complaint Document Details Patient Name: Date of Service: Nancy Hayes 11/05/2022 12:45 PM Medical Record Number: [id number] Patient Account Number: [id number] Date of Birth/Sex: Treating RN: 11-10-1926 (89 y.o. F) Primary Care Provider: Bing Matter Other Clinician: Referring Provider: Treating Provider/Extender: Sabino Dick in Treatment: 8 Information Obtained from: Patient Chief Complaint 09/10/2022; sacral and left buttocks wound Electronic Signature(s) Signed: 11/05/2022 2:52:18 PM By: Kalman Shan DO Entered By: Kalman Shan on 11/05/2022 14:02:10 -------------------------------------------------------------------------------- HPI Details Patient Name: Date of Service: Nancy Hayes. 11/05/2022 12:45 PM Medical Record Number: [id number] Patient Account Number: [id number] Date of Birth/Sex: Treating RN: September 18, 1926 (89 y.o. F) Primary Care Provider: Bing Matter Other Clinician: Referring Provider: Treating Provider/Extender: Sabino Dick in Treatment: 8 History of Present Illness HPI Description: 09/10/2022 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 She is currently bedridden and wheelchair- bound. She has followed up with neurosurgery but is not a surgical candidate. Other past medical history includes essential hypertension and paroxysmal A-fib On Eliquis. She presents today with 2 ulcers located to the sacrum and left buttocks that have been present for at least a month. It is documented in the neurology note 06/03/2022 that she has had bedsores at that time. She currently denies  systemic signs of infection. 2/6; Patient presents for follow up. Shes been using dakins wet to dry dressings. She has no issues or complaints today. She tries to offload the areas but requires a lot of assistance to do so. She denies signs of infection. She resides at [facility] facility. 3/5; patient presents for follow-up. She has been using Dakin's wet-to-dry dressings to her sacral wound and left ischium wound. Unfortunately she has developed multiple wounds to her feet. These appear to be pressure related. She has a skin tear on the right lower extremity. Electronic Signature(s) Signed: 11/05/2022 2:52:18 PM By: Kalman Shan DO Entered By: Kalman Shan on 11/05/2022 14:03:12 -------------------------------------------------------------------------------- Physical Exam Details Patient Name: Date of Service: Nancy Hayes 11/05/2022 12:45 PM Medical Record Number: [id number] Patient Account Number: [id number] Date of Birth/Sex: Treating RN: Feb 01, 1927 (89 y.o. F) Primary Care Provider: Bing Matter Other Clinician: Referring Provider: Treating Provider/Extender: Sabino Dick in Treatment: 8 Constitutional respirations regular, non-labored and within target range for patient.. Cardiovascular 2+ dorsalis pedis/posterior tibialis pulses. Nancy Hayes, Nancy Hayes ([id number]) [id number] Page 2 of 9 Psychiatric pleasant and cooperative. Notes Sacrum: Unhealthy tissue with bone exposed. Left buttocks: Open wound with unhealthy tissue throughout. No probing to bone here. Multiple open wounds to her feet with granulation tissue and scant nonviable tissue. Skin tear to the right lower extremity. No signs of surrounding soft tissue infection to any of the wound beds. Electronic Signature(s) Signed: 11/05/2022 2:52:18 PM By: Kalman Shan DO Entered By: Kalman Shan on 11/05/2022  14:04:32 -------------------------------------------------------------------------------- Physician Orders Details Patient Name: Date of Service: Nancy Hayes 11/05/2022 12:45 PM Medical Record Number: [id number] Patient Account Number: [id number] Date of Birth/Sex: Treating RN: 06-28-1927 (89 y.o. Debby Bud Primary Care Provider: Bing Matter Other Clinician: Referring Provider: Treating Provider/Extender: Sabino Dick in Treatment: 8 Verbal / Phone Orders: No Diagnosis Coding ICD-10 Coding Code Description [phone]  Pressure ulcer of sacral region, stage 4 L89.320 Pressure ulcer of left buttock, unstageable I10 Essential (primary) hypertension Z79.01 Long term (current) use of anticoagulants G82.22 Paraplegia, incomplete M48.00 Spinal stenosis, site unspecified E44.0 Moderate protein-calorie malnutrition I48.0 Paroxysmal atrial fibrillation Follow-up Appointments ppointment in 2 weeks. - Dr .Heber [place] *NO APPOINTMENT BETWEEN 11-12* 11/19/2022 1230 ROOM 8 Return A Other: - Facility to obtain an x-ray of sacrum related to exposed bone looking for bone infection. Please fax x-ray results to [phone]. providing patient with prevalon boots to wear. Bathing/ Shower/ Hygiene Do not shower or bathe in tub. Off-Loading Low air-loss mattress (Group 2) - facility to ensure patient patient is currently using an air mattress. Turn and reposition every 2 hours Prevalon Boot - provided patient with boots for both feet. patient needs to have on to aid in preventing more pressure ulcers and aid in healing. Non Wound Condition Protect area with: - any bony prominences with border foam. Other Non Wound Condition Orders/Instructions: - any skin tears apply xeroform and bordered foam Wound Treatment Wound #1 - Sacrum Cleanser: Wound Cleanser 1 x Per Day/30 Days Discharge Instructions: Cleanse the wound with wound cleanser prior to applying a clean dressing  using gauze sponges, not tissue or cotton balls. Prim Dressing: Dakin's Solution 0.25%, 16 (oz) 1 x Per Day/30 Days ary Discharge Instructions: Moisten gauze with Dakin's solution Secondary Dressing: Zetuvit Plus Silicone Border Dressing 7x7(in/in) 1 x Per Day/30 Days Discharge Instructions: Apply silicone border over primary dressing as directed. Wound #2 - Ischium Wound Laterality: Left Cleanser: Wound Cleanser 1 x Per Day/30 Days Discharge Instructions: Cleanse the wound with wound cleanser prior to applying a clean dressing using gauze sponges, not tissue or cotton balls. Prim Dressing: Dakin's Solution 0.25%, 16 (oz) 1 x Per Day/30 Days ary Discharge Instructions: Moisten gauze with Dakin's solution Nancy Hayes, Nancy Hayes ([id number]) [phone].pdf Page 3 of 9 Secondary Dressing: Zetuvit Plus Silicone Border Dressing 7x7(in/in) 1 x Per Day/30 Days Discharge Instructions: Apply silicone border over primary dressing as directed. Wound #3 - Lower Leg Wound Laterality: Right, Lateral Prim Dressing: Xeroform Occlusive Gauze Dressing, 4x4 in 1 x Per Day/30 Days ary Discharge Instructions: Apply to wound bed as instructed Secondary Dressing: Zetuvit Plus Silicone Border Dressing 4x4 (in/in) 1 x Per Day/30 Days Discharge Instructions: Apply silicone border over primary dressing as directed. Wound #4 - Foot Wound Laterality: Right, Medial Cleanser: Wound Cleanser 1 x Per Day/30 Days Discharge Instructions: Cleanse the wound with wound cleanser prior to applying a clean dressing using gauze sponges, not tissue or cotton balls. Prim Dressing: Hydrofera Blue Ready Transfer Foam, 2.5x2.5 (in/in) 1 x Per Day/30 Days ary Discharge Instructions: Apply directly to wound bed as directed Prim Dressing: Santyl Ointment 1 x Per Day/30 Days ary Discharge Instructions: Apply nickel thick amount to wound bed as instructed Secondary Dressing: Zetuvit Plus Silicone Border Dressing 4x4  (in/in) 1 x Per Day/30 Days Discharge Instructions: Apply silicone border over primary dressing as directed. Wound #5 - Foot Wound Laterality: Left, Medial Cleanser: Wound Cleanser 1 x Per Day/30 Days Discharge Instructions: Cleanse the wound with wound cleanser prior to applying a clean dressing using gauze sponges, not tissue or cotton balls. Prim Dressing: Hydrofera Blue Ready Transfer Foam, 2.5x2.5 (in/in) 1 x Per Day/30 Days ary Discharge Instructions: Apply directly to wound bed as directed Prim Dressing: Santyl Ointment 1 x Per Day/30 Days ary Discharge Instructions: Apply nickel thick amount to wound bed as instructed Secondary Dressing: Zetuvit Plus Silicone Border Dressing 4x4 (  in/in) 1 x Per Day/30 Days Discharge Instructions: Apply silicone border over primary dressing as directed. Radiology X-ray, coccyx/sacrum - facility to obtain x-ray of sacrum r/t exposed bone looking for infection. - (ICD10 L89.154 - Pressure ulcer of sacral region, stage 4) Electronic Signature(s) Signed: 11/05/2022 2:52:18 PM By: Kalman Shan DO Entered By: Kalman Shan on 11/05/2022 14:04:42 Prescription 11/05/2022 -------------------------------------------------------------------------------- Earlie Lou. Kalman Shan DO Patient Name: Provider: 1927/07/30 [id number] Date of Birth: NPI#: [id number] Sex: DEA #: [id number] Phone #: License #: [place] Patient Address: [address] [place], [place] [address] [place], [place] [address] [phone] Allergies No Known Allergies Provider's Orders X-ray, coccyx/sacrum - ICD10: L89.154 - facility to obtain x-ray of sacrum r/t exposed bone looking for infection. Hand Signature: Date(s): KAROLINE, NOSEK ([id number]) [id number] Page 4 of 9 Electronic Signature(s) Signed: 11/05/2022 2:52:18 PM By:  Kalman Shan DO Entered By: Kalman Shan on 11/05/2022 14:04:42 -------------------------------------------------------------------------------- Problem List Details Patient Name: Date of Service: Nancy Hayes 11/05/2022 12:45 PM Medical Record Number: [id number] Patient Account Number: [id number] Date of Birth/Sex: Treating RN: [birth year] (89 y.o. Helene Shoe, Tammi Klippel Primary Care Provider: Bing Matter Other Clinician: Referring Provider: Treating Provider/Extender: Truddie Hidden Weeks in Treatment: 8 Active Problems ICD-10 Encounter Code Description Active Date MDM Diagnosis L89.154 Pressure ulcer of sacral region, stage 4 09/10/2022 No Yes L89.320 Pressure ulcer of left buttock, unstageable 09/10/2022 No Yes I10 Essential (primary) hypertension 09/10/2022 No Yes Z79.01 Long term (current) use of anticoagulants 09/10/2022 No Yes G82.22 Paraplegia, incomplete 09/10/2022 No Yes M48.00 Spinal stenosis, site unspecified 09/10/2022 No Yes E44.0 Moderate protein-calorie malnutrition 09/10/2022 No Yes I48.0 Paroxysmal atrial fibrillation 09/10/2022 No Yes L89.890 Pressure ulcer of other site, unstageable 11/05/2022 No Yes S91.302A Unspecified open wound, left foot, initial encounter 11/05/2022 No Yes S91.301A Unspecified open wound, right foot, initial encounter 11/05/2022 No Yes S81.801A Unspecified open wound, right lower leg, initial encounter 11/05/2022 No Yes Inactive Problems Resolved Problems ADELISE, MCDUFFEE ([id number]) [id number] Page 5 of 9 Electronic Signature(s) Signed: 11/05/2022 2:52:18 PM By: Kalman Shan DO Entered By: Kalman Shan on 11/05/2022 14:10:16 -------------------------------------------------------------------------------- Progress Note Details Patient Name: Date of Service: Nancy Hayes 11/05/2022 12:45 PM Medical Record Number: [id number] Patient Account Number: [id number] Date of Birth/Sex: Treating  RN: 1927/02/13 (89 y.o. F) Primary Care Provider: Bing Matter Other Clinician: Referring Provider: Treating Provider/Extender: Sabino Dick in Treatment: 8 Subjective Chief Complaint Information obtained from Patient 09/10/2022; sacral and left buttocks wound History of Present Illness (HPI) 09/10/2022 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 She is currently bedridden and wheelchair- bound. She has followed up with neurosurgery but is not a surgical candidate. Other past medical history includes essential hypertension and paroxysmal A-fib On Eliquis. She presents today with 2 ulcers located to the sacrum and left buttocks that have been present for at least a month. It is documented in the neurology note 06/03/2022 that she has had bedsores at that time. She currently denies systemic signs of infection. 2/6; Patient presents for follow up. Shes been using dakins wet to dry dressings. She has no issues or complaints today. She tries to offload the areas but  requires a lot of assistance to do so. She denies signs of infection. She resides at [facility] facility. 3/5; patient presents for follow-up. She has been using Dakin's wet-to-dry dressings to her sacral wound and left ischium wound. Unfortunately she has developed multiple wounds to her feet. These appear to be pressure related. She has a skin tear on the right lower extremity. Patient History Information obtained from Patient, Chart. Family History Unknown History. Social History Never smoker, Marital Status - Widowed, Alcohol Use - Never, Drug Use - No History, Caffeine Use - Rarely. Medical History Cardiovascular Patient has history of Arrhythmia - A fibb, Hypertension Denies  history of Congestive Heart Failure, Coronary Artery Disease, Deep Vein Thrombosis, Hypotension, Myocardial Infarction, Peripheral Arterial Disease, Peripheral Venous Disease, Phlebitis, Vasculitis Musculoskeletal Patient has history of Osteoarthritis Neurologic Patient has history of Paraplegia Denies history of Dementia, Neuropathy, Quadriplegia Hospitalization/Surgery History - EGD with propfol. - balloon dilation. - extracorporeal shock wave lithotripsy. - cataract extraction. - partial hysterectomy but pt. states total. Medical A Surgical History Notes nd Neurologic resteless leg syndrome Objective Constitutional respirations regular, non-labored and within target range for patient.. Cardiovascular 2+ dorsalis pedis/posterior tibialis pulses. Psychiatric pleasant and cooperative. Nancy Hayes, Nancy Hayes ([id number]) [id number] Page 6 of 9 General Notes: Sacrum: Unhealthy tissue with bone exposed. Left buttocks: Open wound with unhealthy tissue throughout. No probing to bone here. Multiple open wounds to her feet with granulation tissue and scant nonviable tissue. Skin tear to the right lower extremity. No signs of surrounding soft tissue infection to any of the wound beds. Integumentary (Hair, Skin) Wound #1 status is Open. Original cause of wound was Pressure Injury. The date acquired was: 09/02/2021. The wound has been in treatment 8 weeks. The wound is located on the Sacrum. The wound measures 4.2cm length x 2.3cm width x 0.5cm depth; 7.587cm^2 area and 3.793cm^3 volume. There is bone and Fat Layer (Subcutaneous Tissue) exposed. There is no tunneling noted, however, there is undermining starting at 6:00 and ending at 12:00 with a maximum distance of 2.5cm. There is a large amount of serosanguineous drainage noted. Foul odor after cleansing was noted. The wound margin is well defined and not attached to the wound base. There is large (67-100%) red, pink  granulation within the wound bed. There is a small (1-33%) amount of necrotic tissue within the wound bed including Adherent Slough. The periwound skin appearance exhibited: Scarring. The periwound skin appearance did not exhibit: Callus, Crepitus, Excoriation, Induration, Rash, Dry/Scaly, Maceration, Atrophie Blanche, Cyanosis, Ecchymosis, Hemosiderin Staining, Mottled, Pallor, Rubor, Erythema. Periwound temperature was noted as No Abnormality. The periwound has tenderness on palpation. Wound #2 status is Open. Original cause of wound was Pressure Injury. The date acquired was: 09/02/2021. The wound has been in treatment 8 weeks. The wound is located on the Left Ischium. The wound measures 2.9cm length x 2.8cm width x 1cm depth; 6.377cm^2 area and 6.377cm^3 volume. There is muscle and Fat Layer (Subcutaneous Tissue) exposed. There is no tunneling noted, however, there is undermining starting at 10:00 and ending at 3:00 with a maximum distance of 2cm. There is a medium amount of serosanguineous drainage noted. The wound margin is distinct with the outline attached to the wound base. There is large (67-100%) hyper - granulation within the wound bed. There is a small (1-33%) amount of necrotic tissue within the wound bed including Adherent Slough. The periwound skin appearance exhibited: Scarring. The periwound skin appearance did not exhibit: Callus, Crepitus, Excoriation, Induration, Rash, Dry/Scaly, Maceration, Atrophie Blanche,  Cyanosis, Ecchymosis, Hemosiderin Staining, Mottled, Pallor, Rubor, Erythema. Periwound temperature was noted as No Abnormality. The periwound has tenderness on palpation. Wound #3 status is Open. Original cause of wound was Trauma. The date acquired was: 11/05/2022. The wound is located on the Right,Lateral Lower Leg. The wound measures 5.5cm length x 0.4cm width x 0.1cm depth; 1.728cm^2 area and 0.173cm^3 volume. There is Fat Layer (Subcutaneous Tissue) exposed. There is no  tunneling or undermining noted. There is a small amount of serosanguineous drainage noted. The wound margin is distinct with the outline attached to the wound base. There is large (67-100%) red granulation within the wound bed. There is a small (1-33%) amount of necrotic tissue within the wound bed including Adherent Slough. The periwound skin appearance did not exhibit: Callus, Crepitus, Excoriation, Induration, Rash, Scarring, Dry/Scaly, Maceration, Atrophie Blanche, Cyanosis, Ecchymosis, Hemosiderin Staining, Mottled, Pallor, Rubor, Erythema. Wound #4 status is Open. Original cause of wound was Pressure Injury. The date acquired was: 11/05/2022. The wound is located on the Right,Medial Foot. The wound measures 0.5cm length x 0.4cm width x 0.1cm depth; 0.157cm^2 area and 0.016cm^3 volume. There is Fat Layer (Subcutaneous Tissue) exposed. There is no tunneling or undermining noted. There is a medium amount of serosanguineous drainage noted. The wound margin is distinct with the outline attached to the wound base. There is small (1-33%) red, pink granulation within the wound bed. There is a large (67-100%) amount of necrotic tissue within the wound bed including Adherent Slough. The periwound skin appearance did not exhibit: Callus, Crepitus, Excoriation, Induration, Rash, Scarring, Dry/Scaly, Maceration, Atrophie Blanche, Cyanosis, Ecchymosis, Hemosiderin Staining, Mottled, Pallor, Rubor, Erythema. Wound #5 status is Open. Original cause of wound was Pressure Injury. The date acquired was: 11/05/2022. The wound is located on the Left,Medial Foot. The wound measures 0.8cm length x 1cm width x 0.1cm depth; 0.628cm^2 area and 0.063cm^3 volume. There is no tunneling or undermining noted. There is a medium amount of serosanguineous drainage noted. The wound margin is distinct with the outline attached to the wound base. There is small (1-33%) red, pink granulation within the wound bed. There is a large  (67-100%) amount of necrotic tissue within the wound bed including Adherent Slough. The periwound skin appearance did not exhibit: Callus, Crepitus, Excoriation, Induration, Rash, Scarring, Dry/Scaly, Maceration, Atrophie Blanche, Cyanosis, Ecchymosis, Hemosiderin Staining, Mottled, Pallor, Rubor, Erythema. Assessment Active Problems ICD-10 Pressure ulcer of sacral region, stage 4 Pressure ulcer of left buttock, unstageable Essential (primary) hypertension Long term (current) use of anticoagulants Paraplegia, incomplete Spinal stenosis, site unspecified Moderate protein-calorie malnutrition Paroxysmal atrial fibrillation Pressure ulcer of other site, unstageable Unspecified open wound, left foot, initial encounter Unspecified open wound, right foot, initial encounter Unspecified open wound, right lower leg, initial encounter Patient's sacral wound now has bone exposed. We will order an x-ray. For now I recommended Dakin's wet-to-dry dressings here as well as the left ischium. Unfortunately she has developed multiple pressure ulcers to her feet bilaterally. I recommended Hydrofera Blue and Santyl here. Also recommended using Prevalon boots. She has a skin tear to the right leg and I recommend Xeroform here. Unfortunately patient is contracted and bedbound. Prognosis is poor Plan Follow-up Appointments: Return Appointment in 2 weeks. - Dr .Heber [place] *NO APPOINTMENT BETWEEN 11-12* 11/19/2022 1230 ROOM 8 Other: - Facility to obtain an x-ray of sacrum related to exposed bone looking for bone infection. Please fax x-ray results to [phone]. providing patient with prevalon boots to wear. Bathing/ Shower/ Hygiene: Do not shower or bathe in tub. Off-Loading:  Low air-loss mattress (Group 2) - facility to ensure patient patient is currently using an air mattress. Turn and reposition every 2 hours Prevalon Boot - provided patient with boots for both feet. patient needs to have on to aid in  preventing more pressure ulcers and aid in healing. Non Wound Condition: Protect area with: - any bony prominences with border foam. Other Non Wound Condition Orders/Instructions: - any skin tears apply xeroform and bordered foam Nancy Hayes, Nancy Hayes ([id number]) [phone].pdf Page 7 of 9 Radiology ordered were: X-ray, coccyx/sacrum - facility to obtain x-ray of sacrum r/t exposed bone looking for infection. WOUND #1: - Sacrum Wound Laterality: Cleanser: Wound Cleanser 1 x Per Day/30 Days Discharge Instructions: Cleanse the wound with wound cleanser prior to applying a clean dressing using gauze sponges, not tissue or cotton balls. Prim Dressing: Dakin's Solution 0.25%, 16 (oz) 1 x Per Day/30 Days ary Discharge Instructions: Moisten gauze with Dakin's solution Secondary Dressing: Zetuvit Plus Silicone Border Dressing 7x7(in/in) 1 x Per Day/30 Days Discharge Instructions: Apply silicone border over primary dressing as directed. WOUND #2: - Ischium Wound Laterality: Left Cleanser: Wound Cleanser 1 x Per Day/30 Days Discharge Instructions: Cleanse the wound with wound cleanser prior to applying a clean dressing using gauze sponges, not tissue or cotton balls. Prim Dressing: Dakin's Solution 0.25%, 16 (oz) 1 x Per Day/30 Days ary Discharge Instructions: Moisten gauze with Dakin's solution Secondary Dressing: Zetuvit Plus Silicone Border Dressing 7x7(in/in) 1 x Per Day/30 Days Discharge Instructions: Apply silicone border over primary dressing as directed. WOUND #3: - Lower Leg Wound Laterality: Right, Lateral Prim Dressing: Xeroform Occlusive Gauze Dressing, 4x4 in 1 x Per Day/30 Days ary Discharge Instructions: Apply to wound bed as instructed Secondary Dressing: Zetuvit Plus Silicone Border Dressing 4x4 (in/in) 1 x Per Day/30 Days Discharge Instructions: Apply silicone border over primary dressing as directed. WOUND #4: - Foot Wound Laterality: Right, Medial Cleanser:  Wound Cleanser 1 x Per Day/30 Days Discharge Instructions: Cleanse the wound with wound cleanser prior to applying a clean dressing using gauze sponges, not tissue or cotton balls. Prim Dressing: Hydrofera Blue Ready Transfer Foam, 2.5x2.5 (in/in) 1 x Per Day/30 Days ary Discharge Instructions: Apply directly to wound bed as directed Prim Dressing: Santyl Ointment 1 x Per Day/30 Days ary Discharge Instructions: Apply nickel thick amount to wound bed as instructed Secondary Dressing: Zetuvit Plus Silicone Border Dressing 4x4 (in/in) 1 x Per Day/30 Days Discharge Instructions: Apply silicone border over primary dressing as directed. WOUND #5: - Foot Wound Laterality: Left, Medial Cleanser: Wound Cleanser 1 x Per Day/30 Days Discharge Instructions: Cleanse the wound with wound cleanser prior to applying a clean dressing using gauze sponges, not tissue or cotton balls. Prim Dressing: Hydrofera Blue Ready Transfer Foam, 2.5x2.5 (in/in) 1 x Per Day/30 Days ary Discharge Instructions: Apply directly to wound bed as directed Prim Dressing: Santyl Ointment 1 x Per Day/30 Days ary Discharge Instructions: Apply nickel thick amount to wound bed as instructed Secondary Dressing: Zetuvit Plus Silicone Border Dressing 4x4 (in/in) 1 x Per Day/30 Days Discharge Instructions: Apply silicone border over primary dressing as directed. 1. Dakin's wet-to-dry dressings 2. Hydrofera Blue and Santyl 3. Prevalon boot 4. Sacral x-ray 5. Follow-up in 2 weeks Electronic Signature(s) Signed: 11/19/2022 5:05:19 PM By: Kalman Shan DO Previous Signature: 11/05/2022 2:52:18 PM Version By: Kalman Shan DO Entered By: Kalman Shan on 11/19/2022 14:24:43 -------------------------------------------------------------------------------- HxROS Details Patient Name: Date of Service: Nancy Hayes. 11/05/2022 12:45 PM Medical Record Number: [id number]  Patient Account Number: [id number] Date of Birth/Sex: Treating  RN: March 15, 1927 (89 y.o. F) Primary Care Provider: Bing Matter Other Clinician: Referring Provider: Treating Provider/Extender: Sabino Dick in Treatment: 8 Information Obtained From Patient Chart Cardiovascular Medical History: Positive for: Arrhythmia - A fibb; Hypertension Negative for: Congestive Heart Failure; Coronary Artery Disease; Deep Vein Thrombosis; Hypotension; Myocardial Infarction; Peripheral Arterial Disease; Peripheral Venous Disease; Phlebitis; Vasculitis Musculoskeletal Medical History: Positive for: Osteoarthritis Neurologic Medical HistoryLAKETHIA, Nancy Hayes ([id number]) [phone].pdf Page 8 of 9 Positive for: Paraplegia Negative for: Dementia; Neuropathy; Quadriplegia Past Medical History Notes: resteless leg syndrome Immunizations Pneumococcal Vaccine: Received Pneumococcal Vaccination: Yes Received Pneumococcal Vaccination On or After 60th Birthday: Yes Implantable Devices None Hospitalization / Surgery History Type of Hospitalization/Surgery EGD with propfol balloon dilation extracorporeal shock wave lithotripsy cataract extraction partial hysterectomy but pt. states total Family and Social History Unknown History: Yes; Never smoker; Marital Status - Widowed; Alcohol Use: Never; Drug Use: No History; Caffeine Use: Rarely; Financial Concerns: No; Food, Clothing or Shelter Needs: No; Support System Lacking: No; Transportation Concerns: No Electronic Signature(s) Signed: 11/05/2022 2:52:18 PM By: Kalman Shan DO Entered By: Kalman Shan on 11/05/2022 14:03:16 -------------------------------------------------------------------------------- SuperBill Details Patient Name: Date of Service: Nancy Hayes 11/05/2022 Medical Record Number: [id number] Patient Account Number: [id number] Date of Birth/Sex: Treating RN: Jan 29, 1927 (89 y.o. 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, incomplete M48.00 Spinal stenosis, site unspecified E44.0 Moderate protein-calorie malnutrition I48.0 Paroxysmal atrial fibrillation L89.890 Pressure ulcer of other site, unstageable S91.302A Unspecified open wound, left foot, initial encounter S91.301A Unspecified open wound, right foot, initial encounter S81.801A Unspecified open wound, right lower leg, initial encounter Facility Procedures : CPT4 Code: [id number] Description: [place] VISIT-LEV 5 EST PT Modifier: Quantity: 1 Physician Procedures : CPT4 Code Description Modifier [id number] - WC PHYS LEVEL 3 - EST PT ICD-10 Diagnosis Description L89.154 Pressure ulcer of sacral region, stage 4 L89.320 Pressure ulcer of left buttock, unstageable Nancy Hayes, Nancy Hayes ([id number])  [phone].pd L89.890 Pressure ulcer of other site, unstageable S91.302A Unspecified open wound, left foot, initial encounter Quantity: 1 f Page 9 of 9 Electronic Signature(s) Signed: 11/25/2022 2:41:34 PM By: Deon Pilling RN, BSN Signed: 11/26/2022 10:35:32 AM By: Kalman Shan DO Previous Signature: 11/05/2022 2:52:18 PM Version By: Kalman Shan DO Entered By: Deon Pilling on 11/25/2022 14:38:15

## 2022-11-07 NOTE — Progress Notes (Addendum)
MELIZZA, AMONETT ([id number]) [id number].pdf Page 1 of 12 Visit Report for 11/05/2022 Arrival Information Details Patient Name: Date of Service: Nancy Hayes 11/05/2022 12:45 PM Medical Record Number: [id number] Patient Account Number: [id number] Date of Birth/Sex: Treating RN: 07-29-1927 (89 y.o. Helene Shoe, Meta.Reding Primary Care Vitali Seibert: Bing Matter Other Clinician: Referring Wilmoth Rasnic: Treating Ciro Tashiro/Extender: Sabino Dick in Treatment: 8 Visit Information History Since Last Visit Added or deleted any medications: No Patient Arrived: Wheel Chair Any new allergies or adverse reactions: No Arrival Time: 13:03 Had a fall or experienced change in No Accompanied By: husband activities of daily living that may affect Transfer Assistance: Manual risk of falls: Patient Identification Verified: Yes Signs or symptoms of abuse/neglect since last visito No Secondary Verification Process Completed: Yes Hospitalized since last visit: No Patient Requires Transmission-Based Precautions: No Implantable device outside of the clinic excluding No Patient Has Alerts: No cellular tissue based products placed in the center since last visit: Pain Present Now: No Electronic Signature(s) Signed: 11/05/2022 5:29:51 PM By: Deon Pilling RN, BSN Entered By: Deon Pilling on 11/05/2022 13:04:32 -------------------------------------------------------------------------------- Clinic Level of Care Assessment Details Patient Name: Date of Service: Nancy Hayes 11/05/2022 12:45 PM Medical Record Number: [id number] Patient Account Number: [id number] Date of Birth/Sex: Treating RN: 06-02-1927 (89 y.o. Helene Shoe, Tammi Klippel Primary Care Kiauna Zywicki: Bing Matter Other Clinician: Referring Rowin Bayron: Treating Nariah Morgano/Extender: Sabino Dick in Treatment: 8 Clinic Level of Care Assessment Items TOOL 4 Quantity Score X- 1 0 Use when  only an EandM is performed on FOLLOW-UP visit ASSESSMENTS - Nursing Assessment / Reassessment X- 1 10 Reassessment of Co-morbidities (includes updates in patient status) X- 1 5 Reassessment of Adherence to Treatment Plan ASSESSMENTS - Wound and Skin A ssessment / Reassessment []  - 0 Simple Wound Assessment / Reassessment - one wound X- 4 5 Complex Wound Assessment / Reassessment - multiple wounds X- 1 10 Dermatologic / Skin Assessment (not related to wound area) ASSESSMENTS - Focused Assessment []  - 0 Circumferential Edema Measurements - multi extremities X- 1 10 Nutritional Assessment / Counseling / Intervention []  - 0 Lower Extremity Assessment (monofilament, tuning fork, pulses) []  - 0 Peripheral Arterial Disease Assessment (using hand held doppler) ASSESSMENTS - Ostomy and/or Continence Assessment and Care []  - 0 Incontinence Assessment and Management []  - 0 Ostomy Care Assessment and Management (repouching, etc.) PROCESS - Coordination of Care []  - 0 Simple Patient / Family Education for ongoing care Nancy Hayes, Nancy Hayes ([id number]) [phone].pdf Page 2 of 12 X- 1 20 Complex (extensive) Patient / Family Education for ongoing care X- 1 10 Staff obtains Consents, Records, T Results / Process Orders est X- 1 10 Staff telephones HHA, Nursing Homes / Clarify orders / etc []  - 0 Routine Transfer to another Facility (non-emergent condition) []  - 0 Routine Hospital Admission (non-emergent condition) []  - 0 New Admissions / Biomedical engineer / Ordering NPWT Apligraf, etc. , []  - 0 Emergency Hospital Admission (emergent condition) []  - 0 Simple Discharge Coordination X- 1 15 Complex (extensive) Discharge Coordination PROCESS - Special Needs []  - 0 Pediatric / Minor Patient Management []  - 0 Isolation Patient Management []  - 0 Hearing / Language / Visual special needs []  - 0 Assessment of Community assistance (transportation, D/C  planning, etc.) []  - 0 Additional assistance / Altered mentation []  - 0 Support Surface(s) Assessment (bed, cushion, seat, etc.) INTERVENTIONS - Wound Cleansing / Measurement []  - 0 Simple Wound Cleansing - one wound X- 4  5 Complex Wound Cleansing - multiple wounds X- 1 5 Wound Imaging (photographs - any number of wounds) []  - 0 Wound Tracing (instead of photographs) []  - 0 Simple Wound Measurement - one wound X- 4 5 Complex Wound Measurement - multiple wounds INTERVENTIONS - Wound Dressings X - Small Wound Dressing one or multiple wounds 4 10 []  - 0 Medium Wound Dressing one or multiple wounds []  - 0 Large Wound Dressing one or multiple wounds []  - 0 Application of Medications - topical []  - 0 Application of Medications - injection INTERVENTIONS - Miscellaneous []  - 0 External ear exam []  - 0 Specimen Collection (cultures, biopsies, blood, body fluids, etc.) []  - 0 Specimen(s) / Culture(s) sent or taken to Lab for analysis []  - 0 Patient Transfer (multiple staff / Civil Service fast streamer / Similar devices) []  - 0 Simple Staple / Suture removal (25 or less) []  - 0 Complex Staple / Suture removal (26 or more) []  - 0 Hypo / Hyperglycemic Management (close monitor of Blood Glucose) []  - 0 Ankle / Brachial Index (ABI) - do not check if billed separately X- 1 5 Vital Signs Has the patient been seen at the hospital within the last three years: Yes Total Score: 200 Level Of Care: New/Established - Level 5 Electronic Signature(s) Signed: 11/25/2022 2:41:34 PM By: Deon Pilling RN, BSN Entered By: Deon Pilling on 11/25/2022 14:38:07 Nancy Hayes ([id number].pdf Page 3 of 12 -------------------------------------------------------------------------------- Lower Extremity Assessment Details Patient Name: Date of Service: Nancy Hayes 11/05/2022 12:45 PM Medical Record Number: [id number] Patient Account Number: [id number] Date of Birth/Sex:  Treating RN: 1926-12-16 (89 y.o. Debby Bud Primary Care Bobbye Reinitz: Bing Matter Other Clinician: Referring Mykeal Carrick: Treating Leesha Veno/Extender: Truddie Hidden Weeks in Treatment: 8 Electronic Signature(s) Signed: 11/05/2022 5:29:51 PM By: Deon Pilling RN, BSN Entered By: Deon Pilling on 11/05/2022 13:04:46 -------------------------------------------------------------------------------- Multi Wound Chart Details Patient Name: Date of Service: Nancy Hayes 11/05/2022 12:45 PM Medical Record Number: [id number] Patient Account Number: [id number] Date of Birth/Sex: Treating RN: Mar 07, 1927 (89 y.o. F) Primary Care Amier Hoyt: Bing Matter Other Clinician: Referring Reiley Keisler: Treating Maliaka Brasington/Extender: Truddie Hidden Weeks in Treatment: 8 [1:Photos: No Photos] Sacrum Left Ischium Right, Lateral Lower Leg Wound Location: Pressure Injury Pressure Injury Trauma Wounding Event: Pressure Ulcer Pressure Ulcer Skin Tear Primary Etiology: Arrhythmia, Hypertension, Arrhythmia, Hypertension, Arrhythmia, Hypertension, Comorbid History: Osteoarthritis, Paraplegia Osteoarthritis, Paraplegia Osteoarthritis, Paraplegia 09/02/2021 09/02/2021 11/05/2022 Date Acquired: 8 8 0 Weeks of Treatment: Open Open Open Wound Status: No No No Wound Recurrence: 4.2x2.3x0.5 2.9x2.8x1 5.5x0.4x0.1 Measurements L x W x D (cm) 7.587 6.377 1.728 A (cm) : rea 3.793 6.377 0.173 Volume (cm) : 42.00% 86.90% N/A % Reduction in A rea: 79.30% 93.40% N/A % Reduction in Volume: 6 10 Starting Position 1 (o'clock): 12 3 Ending Position 1 (o'clock): 2.5 2 Maximum Distance 1 (cm): Yes Yes No Undermining: Category/Stage IV Category/Stage IV Full Thickness Without Exposed Classification: Support Structures Large Medium Small Exudate Amount: Serosanguineous Serosanguineous Serosanguineous Exudate Type: red, brown red, brown red, brown Exudate Color: Yes No  No Foul Odor A Cleansing: fter No N/A N/A Odor Anticipated Due to Product Use: Well defined, not attached Distinct, outline attached Distinct, outline attached Wound Margin: Large (67-100%) Large (67-100%) Large (67-100%) Granulation A mount: Red, Pink Hyper-granulation Red Granulation Quality: Small (1-33%) Small (1-33%) Small (1-33%) Necrotic Amount: Fat Layer (Subcutaneous Tissue): Yes Fat Layer (Subcutaneous Tissue): Yes Fat Layer (Subcutaneous Tissue): Yes Exposed Structures: Bone: Yes Muscle: Yes  Fascia: No Fascia: No Fascia: No Tendon: No Tendon: No Tendon: No Muscle: No Muscle: No Joint: No Joint: No Joint: No Bone: No Bone: No None None Small (1-33%) Epithelialization: Scarring: Yes Scarring: Yes Excoriation: No Periwound Skin Texture: Excoriation: No Excoriation: No Induration: No Induration: No Induration: No Callus: No RAHINI, STIFTER ([id number]) [phone].pdf Page 4 of 12 Callus: No Callus: No Crepitus: No Crepitus: No Crepitus: No Rash: No Rash: No Rash: No Scarring: No Maceration: No Maceration: No Maceration: No Periwound Skin Moisture: Dry/Scaly: No Dry/Scaly: No Dry/Scaly: No Atrophie Blanche: No Atrophie Blanche: No Atrophie Blanche: No Periwound Skin Color: Cyanosis: No Cyanosis: No Cyanosis: No Ecchymosis: No Ecchymosis: No Ecchymosis: No Erythema: No Erythema: No Erythema: No Hemosiderin Staining: No Hemosiderin Staining: No Hemosiderin Staining: No Mottled: No Mottled: No Mottled: No Pallor: No Pallor: No Pallor: No Rubor: No Rubor: No Rubor: No No Abnormality No Abnormality N/A Temperature: Yes Yes N/A Tenderness on Palpation: Wound Number: 4 5 N/A Photos: N/A Right, Medial Foot Left, Medial Foot N/A Wound Location: Pressure Injury Pressure Injury N/A Wounding Event: Pressure Ulcer Pressure Ulcer N/A Primary Etiology: Arrhythmia, Hypertension, Arrhythmia, Hypertension,  N/A Comorbid History: Osteoarthritis, Paraplegia Osteoarthritis, Paraplegia 11/05/2022 11/05/2022 N/A Date Acquired: 0 0 N/A Weeks of Treatment: Open Open N/A Wound Status: No No N/A Wound Recurrence: 0.5x0.4x0.1 0.8x1x0.1 N/A Measurements L x W x D (cm) 0.157 0.628 N/A A (cm) : rea 0.016 0.063 N/A Volume (cm) : N/A N/A N/A % Reduction in A rea: N/A N/A N/A % Reduction in Volume: No No N/A Undermining: Unstageable/Unclassified Unstageable/Unclassified N/A Classification: Medium Medium N/A Exudate A mount: Serosanguineous Serosanguineous N/A Exudate Type: red, brown red, brown N/A Exudate Color: No No N/A Foul Odor A Cleansing: fter N/A N/A N/A Odor A nticipated Due to Product Use: Distinct, outline attached Distinct, outline attached N/A Wound Margin: Small (1-33%) Small (1-33%) N/A Granulation A mount: Red, Pink Red, Pink N/A Granulation Quality: Large (67-100%) Large (67-100%) N/A Necrotic A mount: Fat Layer (Subcutaneous Tissue): Yes Fascia: No N/A Exposed Structures: Fascia: No Fat Layer (Subcutaneous Tissue): No Tendon: No Tendon: No Muscle: No Muscle: No Joint: No Joint: No Bone: No Bone: No Small (1-33%) Small (1-33%) N/A Epithelialization: Excoriation: No Excoriation: No N/A Periwound Skin Texture: Induration: No Induration: No Callus: No Callus: No Crepitus: No Crepitus: No Rash: No Rash: No Scarring: No Scarring: No Maceration: No Maceration: No N/A Periwound Skin Moisture: Dry/Scaly: No Dry/Scaly: No Atrophie Blanche: No Atrophie Blanche: No N/A Periwound Skin Color: Cyanosis: No Cyanosis: No Ecchymosis: No Ecchymosis: No Erythema: No Erythema: No Hemosiderin Staining: No Hemosiderin Staining: No Mottled: No Mottled: No Pallor: No Pallor: No Rubor: No Rubor: No N/A N/A N/A Temperature: Treatment Notes Electronic Signature(s) Signed: 11/05/2022 2:52:18 PM By: Kalman Shan DO Entered By: Kalman Shan on  11/05/2022 14:09:49 Nancy Hayes ([id number].pdf Page 5 of 12 -------------------------------------------------------------------------------- Multi-Disciplinary Care Plan Details Patient Name: Date of Service: Nancy Hayes 11/05/2022 12:45 PM Medical Record Number: [id number] Patient Account Number: [id number] Date of Birth/Sex: Treating RN: 11-04-1926 (89 y.o. Debby Bud Primary Care Clarke Amburn: Bing Matter Other Clinician: Referring Ethelwyn Gilbertson: Treating Shantelle Alles/Extender: Sabino Dick in Treatment: 8 Active Inactive Nutrition Nursing Diagnoses: Imbalanced nutrition Potential for alteratiion in Nutrition/Potential for imbalanced nutrition Goals: Patient/caregiver agrees to and verbalizes understanding of need to obtain nutritional consultation Date Initiated: 09/10/2022 Target Resolution Date: 01/03/2023 Goal Status: Active Patient/caregiver agrees to and verbalizes understanding of need to use nutritional supplements and/or vitamins  as prescribed Date Initiated: 09/10/2022 Target Resolution Date: 01/03/2023 Goal Status: Active Interventions: Provide education on nutrition Treatment Activities: Dietary management education, guidance and counseling : 09/10/2022 Patient referred to Primary Care Physician for further nutritional evaluation : 09/10/2022 Notes: Osteomyelitis Nursing Diagnoses: Infection: osteomyelitis Knowledge deficit related to disease process and management Goals: Diagnostic evaluation for osteomyelitis completed as ordered Date Initiated: 09/10/2022 Target Resolution Date: 01/03/2023 Goal Status: Active Signs and symptoms for osteomyelitis will be recognized and promptly addressed Date Initiated: 09/10/2022 Target Resolution Date: 01/03/2023 Goal Status: Active Interventions: Assess for signs and symptoms of osteomyelitis resolution every visit Provide education on osteomyelitis Treatment  Activities: Surgical debridement : 09/10/2022 Systemic antibiotics : 09/10/2022 Notes: Pressure Nursing Diagnoses: Knowledge deficit related to causes and risk factors for pressure ulcer development Knowledge deficit related to management of pressures ulcers Goals: Patient/caregiver will verbalize risk factors for pressure ulcer development Date Initiated: 09/10/2022 Target Resolution Date: 01/03/2023 Goal Status: Active Patient/caregiver will verbalize understanding of pressure ulcer management Date Initiated: 09/10/2022 Target Resolution Date: 01/03/2023 Goal Status: Active Nancy Hayes, Nancy Hayes ([id number]) [phone].pdf Page 6 of 12 Interventions: Assess: immobility, friction, shearing, incontinence upon admission and as needed Assess offloading mechanisms upon admission and as needed Assess potential for pressure ulcer upon admission and as needed Provide education on pressure ulcers Notes: Electronic Signature(s) Signed: 11/05/2022 5:29:51 PM By: Deon Pilling RN, BSN Entered By: Deon Pilling on 11/05/2022 13:13:47 -------------------------------------------------------------------------------- Pain Assessment Details Patient Name: Date of Service: Nancy Hayes. 11/05/2022 12:45 PM Medical Record Number: [id number] Patient Account Number: [id number] Date of Birth/Sex: Treating RN: 1927/05/01 (89 y.o. Debby Bud Primary Care Kemya Shed: Bing Matter Other Clinician: Referring Artelia Game: Treating Twylah Bennetts/Extender: Truddie Hidden Weeks in Treatment: 8 Active Problems Location of Pain Severity and Description of Pain Patient Has Paino No Site Locations Pain Management and Medication Current Pain Management: Electronic Signature(s) Signed: 11/05/2022 5:29:51 PM By: Deon Pilling RN, BSN Entered By: Deon Pilling on 11/05/2022 13:04:40 -------------------------------------------------------------------------------- Patient/Caregiver  Education Details Patient Name: Date of Service: Nancy Hayes 3/5/2024andnbsp12:45 PM Medical Record Number: [id number] Patient Account Number: [id number] Date of Birth/Gender: Treating RN: 04/04/1927 (89 y.o. Debby Bud Primary Care Physician: Bing Matter Other Clinician: Referring Physician: Treating Physician/Extender: Sabino Dick in Treatment: 8 Education Assessment Education Provided To: Patient NIGEL, YONG ([id number]) [id number] Page 7 of 12 Education Topics Provided Wound/Skin Impairment: Handouts: Caring for Your Ulcer Methods: Explain/Verbal Responses: Reinforcements needed Electronic Signature(s) Signed: 11/05/2022 5:29:51 PM By: Deon Pilling RN, BSN Entered By: Deon Pilling on 11/05/2022 13:14:01 -------------------------------------------------------------------------------- Wound Assessment Details Patient Name: Date of Service: Nancy Hayes. 11/05/2022 12:45 PM Medical Record Number: [id number] Patient Account Number: [id number] Date of Birth/Sex: Treating RN: 03/18/1927 (89 y.o. Helene Shoe, Tammi Klippel Primary Care Dawsen Krieger: Bing Matter Other Clinician: Referring Johnita Palleschi: Treating Armanie Martine/Extender: Truddie Hidden Weeks in Treatment: 8 Wound Status Wound Number: 1 Primary Etiology: Pressure Ulcer Wound Location: Sacrum Wound Status: Open Wounding Event: Pressure Injury Comorbid History: Arrhythmia, Hypertension, Osteoarthritis, Paraplegia Date Acquired: 09/02/2021 Weeks Of Treatment: 8 Clustered Wound: No Wound Measurements Length: (cm) 4.2 Width: (cm) 2.3 Depth: (cm) 0.5 Area: (cm) 7.587 Volume: (cm) 3.793 % Reduction in Area: 42% % Reduction in Volume: 79.3% Epithelialization: None Tunneling: No Undermining: Yes Starting Position (o'clock): 6 Ending Position (o'clock): 12 Maximum Distance: (cm) 2.5 Wound Description Classification: Category/Stage  IV Wound Margin: Well defined, not attached Exudate Amount: Large Exudate Type: Serosanguineous Exudate Color: red, brown Foul Odor  After Cleansing: Yes Due to Product Use: No Slough/Fibrino Yes Wound Bed Granulation Amount: Large (67-100%) Exposed Structure Granulation Quality: Red, Pink Fascia Exposed: No Necrotic Amount: Small (1-33%) Fat Layer (Subcutaneous Tissue) Exposed: Yes Necrotic Quality: Adherent Slough Tendon Exposed: No Muscle Exposed: No Joint Exposed: No Bone Exposed: Yes Periwound Skin Texture Texture Color No Abnormalities Noted: No No Abnormalities Noted: No Callus: No Atrophie Blanche: No Crepitus: No Cyanosis: No Excoriation: No Ecchymosis: No Induration: No Erythema: No Rash: No Hemosiderin Staining: No Scarring: Yes Mottled: No Pallor: No Moisture Rubor: No No Abnormalities Noted: No Dry / Scaly: No Temperature / Pain Maceration: No Temperature: No Abnormality Tenderness on PalpationABIGAYL, Nancy Hayes ([id number]) [phone].pdf Page 8 of 12 Electronic Signature(s) Signed: 11/05/2022 5:29:51 PM By: Deon Pilling RN, BSN Entered By: Deon Pilling on 11/05/2022 13:07:36 -------------------------------------------------------------------------------- Wound Assessment Details Patient Name: Date of Service: Nancy Hayes 11/05/2022 12:45 PM Medical Record Number: [id number] Patient Account Number: [id number] Date of Birth/Sex: Treating RN: July 24, 1927 (89 y.o. Helene Shoe, Tammi Klippel Primary Care Devory Mckinzie: Bing Matter Other Clinician: Referring Claudeen Leason: Treating Robby Bulkley/Extender: Truddie Hidden Weeks in Treatment: 8 Wound Status Wound Number: 2 Primary Etiology: Pressure Ulcer Wound Location: Left Ischium Wound Status: Open Wounding Event: Pressure Injury Comorbid History: Arrhythmia, Hypertension, Osteoarthritis, Paraplegia Date Acquired: 09/02/2021 Weeks Of Treatment: 8 Clustered Wound:  No Photos Wound Measurements Length: (cm) 2.9 Width: (cm) 2.8 Depth: (cm) 1 Area: (cm) 6.377 Volume: (cm) 6.377 % Reduction in Area: 86.9% % Reduction in Volume: 93.4% Epithelialization: None Tunneling: No Undermining: Yes Starting Position (o'clock): 10 Ending Position (o'clock): 3 Maximum Distance: (cm) 2 Wound Description Classification: Category/Stage IV Wound Margin: Distinct, outline attached Exudate Amount: Medium Exudate Type: Serosanguineous Exudate Color: red, brown Foul Odor After Cleansing: No Slough/Fibrino Yes Wound Bed Granulation Amount: Large (67-100%) Exposed Structure Granulation Quality: Hyper-granulation Fascia Exposed: No Necrotic Amount: Small (1-33%) Fat Layer (Subcutaneous Tissue) Exposed: Yes Necrotic Quality: Adherent Slough Tendon Exposed: No Muscle Exposed: Yes Necrosis of Muscle: No Joint Exposed: No Bone Exposed: No Periwound Skin Texture Texture Color No Abnormalities Noted: No No Abnormalities Noted: No Callus: No Atrophie Blanche: No Crepitus: No Cyanosis: No Excoriation: No Ecchymosis: No Induration: No Erythema: No Nancy Hayes, Nancy Hayes ([id number]) [phone].pdf Page 9 of 12 Rash: No Hemosiderin Staining: No Scarring: Yes Mottled: No Pallor: No Moisture Rubor: No No Abnormalities Noted: No Dry / Scaly: No Temperature / Pain Maceration: No Temperature: No Abnormality Tenderness on Palpation: Yes Electronic Signature(s) Signed: 11/05/2022 5:29:51 PM By: Deon Pilling RN, BSN Entered By: Deon Pilling on 11/05/2022 13:08:00 -------------------------------------------------------------------------------- Wound Assessment Details Patient Name: Date of Service: Nancy Hayes. 11/05/2022 12:45 PM Medical Record Number: [id number] Patient Account Number: [id number] Date of Birth/Sex: Treating RN: [birth year] (89 y.o. Helene Shoe, Tammi Klippel Primary Care Daemian Gahm: Bing Matter Other Clinician: Referring  Arora Coakley: Treating Maryetta Shafer/Extender: Truddie Hidden Weeks in Treatment: 8 Wound Status Wound Number: 3 Primary Etiology: Skin Tear Wound Location: Right, Lateral Lower Leg Wound Status: Open Wounding Event: Trauma Comorbid History: Arrhythmia, Hypertension, Osteoarthritis, Paraplegia Date Acquired: 11/05/2022 Weeks Of Treatment: 0 Clustered Wound: No Photos Wound Measurements Length: (cm) 5.5 Width: (cm) 0.4 Depth: (cm) 0.1 Area: (cm) 1.728 Volume: (cm) 0.173 % Reduction in Area: % Reduction in Volume: Epithelialization: Small (1-33%) Tunneling: No Undermining: No Wound Description Classification: Full Thickness Without Exposed Suppor Wound Margin: Distinct, outline attached Exudate Amount: Small Exudate Type: Serosanguineous Exudate Color: red, brown t Structures Foul Odor After Cleansing: No Slough/Fibrino Yes  Wound Bed Granulation Amount: Large (67-100%) Exposed Structure Granulation Quality: Red Fascia Exposed: No Necrotic Amount: Small (1-33%) Fat Layer (Subcutaneous Tissue) Exposed: Yes Necrotic Quality: Adherent Slough Tendon Exposed: No Muscle Exposed: No Joint Exposed: No Bone Exposed: No Periwound Skin Texture Texture Color No Abnormalities Noted: No No Abnormalities Noted: No Nancy Hayes, Nancy Hayes ([id number]) [phone].pdf Page 10 of 12 Callus: No Atrophie Blanche: No Crepitus: No Cyanosis: No Excoriation: No Ecchymosis: No Induration: No Erythema: No Rash: No Hemosiderin Staining: No Scarring: No Mottled: No Pallor: No Moisture Rubor: No No Abnormalities Noted: No Dry / Scaly: No Maceration: No Electronic Signature(s) Signed: 11/05/2022 5:29:51 PM By: Deon Pilling RN, BSN Entered By: Deon Pilling on 11/05/2022 13:27:42 -------------------------------------------------------------------------------- Wound Assessment Details Patient Name: Date of Service: Nancy Hayes. 11/05/2022 12:45  PM Medical Record Number: [id number] Patient Account Number: [id number] Date of Birth/Sex: Treating RN: 11-24-1926 (89 y.o. Helene Shoe, Tammi Klippel Primary Care [place]: Bing Matter Other Clinician: Referring Matsue Strom: Treating Dorethea Strubel/Extender: Truddie Hidden Weeks in Treatment: 8 Wound Status Wound Number: 4 Primary Etiology: Pressure Ulcer Wound Location: Right, Medial Foot Wound Status: Open Wounding Event: Pressure Injury Comorbid History: Arrhythmia, Hypertension, Osteoarthritis, Paraplegia Date Acquired: 11/05/2022 Weeks Of Treatment: 0 Clustered Wound: No Photos Wound Measurements Length: (cm) 0.5 Width: (cm) 0.4 Depth: (cm) 0.1 Area: (cm) 0.157 Volume: (cm) 0.016 % Reduction in Area: % Reduction in Volume: Epithelialization: Small (1-33%) Tunneling: No Undermining: No Wound Description Classification: Unstageable/Unclassified Wound Margin: Distinct, outline attached Exudate Amount: Medium Exudate Type: Serosanguineous Exudate Color: red, brown Foul Odor After Cleansing: No Slough/Fibrino Yes Wound Bed Granulation Amount: Small (1-33%) Exposed Structure Granulation Quality: Red, Pink Fascia Exposed: No Necrotic Amount: Large (67-100%) Fat Layer (Subcutaneous Tissue) Exposed: Yes Necrotic Quality: Adherent Slough Tendon Exposed: No Muscle Exposed: No Joint Exposed: No Bone Exposed: No [address] CAMREN, Nancy Hayes ([id number]) [id number].pdf Page 11 of 12 Texture Color No Abnormalities Noted: No No Abnormalities Noted: No Callus: No Atrophie Blanche: No Crepitus: No Cyanosis: No Excoriation: No Ecchymosis: No Induration: No Erythema: No Rash: No Hemosiderin Staining: No Scarring: No Mottled: No Pallor: No Moisture Rubor: No No Abnormalities Noted: No Dry / Scaly: No Maceration: No Electronic Signature(s) Signed: 11/05/2022 5:29:51 PM By: Deon Pilling RN, BSN Entered By: Deon Pilling on  11/05/2022 13:27:02 -------------------------------------------------------------------------------- Wound Assessment Details Patient Name: Date of Service: Nancy Hayes. 11/05/2022 12:45 PM Medical Record Number: [id number] Patient Account Number: [id number] Date of Birth/Sex: Treating RN: [birth year] (89 y.o. Helene Shoe, Tammi Klippel Primary Care Kerrington Greenhalgh: Bing Matter Other Clinician: Referring Akili Corsetti: Treating Bellany Elbaum/Extender: Truddie Hidden Weeks in Treatment: 8 Wound Status Wound Number: 5 Primary Etiology: Pressure Ulcer Wound Location: Left, Medial Foot Wound Status: Open Wounding Event: Pressure Injury Comorbid History: Arrhythmia, Hypertension, Osteoarthritis, Paraplegia Date Acquired: 11/05/2022 Weeks Of Treatment: 0 Clustered Wound: No Photos Wound Measurements Length: (cm) 0.8 Width: (cm) 1 Depth: (cm) 0.1 Area: (cm) 0.628 Volume: (cm) 0.063 % Reduction in Area: % Reduction in Volume: Epithelialization: Small (1-33%) Tunneling: No Undermining: No Wound Description Classification: Unstageable/Unclassified Wound Margin: Distinct, outline attached Exudate Amount: Medium Exudate Type: Serosanguineous Exudate Color: red, brown Foul Odor After Cleansing: No Slough/Fibrino Yes Wound Bed Granulation Amount: Small (1-33%) Exposed Structure Granulation Quality: Red, Pink Fascia Exposed: No Necrotic Amount: Large (67-100%) Fat Layer (Subcutaneous Tissue) Exposed: No Necrotic Quality: Adherent Slough Tendon Exposed: No Muscle Exposed: No Joint Exposed: No Bone Exposed: No Nancy Hayes, Nancy Hayes ([id number]) [phone].pdf Page 12 of 12 Periwound Skin Texture  Texture Color No Abnormalities Noted: No No Abnormalities Noted: No Callus: No Atrophie Blanche: No Crepitus: No Cyanosis: No Excoriation: No Ecchymosis: No Induration: No Erythema: No Rash: No Hemosiderin Staining: No Scarring: No Mottled: No Pallor:  No Moisture Rubor: No No Abnormalities Noted: No Dry / Scaly: No Maceration: No Electronic Signature(s) Signed: 11/05/2022 5:29:51 PM By: Deon Pilling RN, BSN Entered By: Deon Pilling on 11/05/2022 13:24:26

## 2022-11-18 ENCOUNTER — Other Ambulatory Visit: Payer: Self-pay | Admitting: Cardiovascular Disease

## 2022-11-18 MED ORDER — APIXABAN 2.5 MG PO TABS: 2.5000 mg | ORAL_TABLET | Freq: Two times a day (BID) | ORAL | 1 refills | Status: DC

## 2022-11-18 NOTE — Addendum Note (Signed)
Addended by: Brynda Peon on: 11/18/2022 10:30 AM   Modules accepted: Orders

## 2022-11-18 NOTE — Telephone Encounter (Signed)
Pt last saw Dr Sallyanne Kuster 01/03/22, last labs 05/17/22 Creat 0.37, age 89, weight 51.7kg, based on specified criteria pt is on appropriate dosage of Eliquis 2.5mg  BID for afib.  Will refill rx.

## 2022-11-19 ENCOUNTER — Encounter (HOSPITAL_BASED_OUTPATIENT_CLINIC_OR_DEPARTMENT_OTHER): Payer: Medicare Other | Admitting: Internal Medicine

## 2022-11-19 DIAGNOSIS — L89154 Pressure ulcer of sacral region, stage 4: Secondary | ICD-10-CM

## 2022-11-19 DIAGNOSIS — S91301A Unspecified open wound, right foot, initial encounter: Secondary | ICD-10-CM | POA: Diagnosis not present

## 2022-11-19 DIAGNOSIS — S91302A Unspecified open wound, left foot, initial encounter: Secondary | ICD-10-CM

## 2022-11-19 DIAGNOSIS — L89320 Pressure ulcer of left buttock, unstageable: Secondary | ICD-10-CM

## 2022-11-20 NOTE — Progress Notes (Signed)
JILISA, DHANRAJ ([id number]) [id number] Page 1 of 8 Visit Report for 11/19/2022 Chief Complaint Document Details Patient Name: Date of Service: Nancy Hayes 11/19/2022 12:30 PM Medical Record Number: [id number] Patient Account Number: [id number] Date of Birth/Sex: Treating RN: 1927/01/21 (89 y.o. F) Primary Care Provider: Bing Matter Other Clinician: Referring Provider: Treating Provider/Extender: Sabino Dick in Treatment: 10 Information Obtained from: Patient Chief Complaint 09/10/2022; sacral and left buttocks wound Electronic Signature(s) Signed: 11/19/2022 5:03:25 PM By: Kalman Shan DO Entered By: Kalman Shan on 11/19/2022 14:34:59 -------------------------------------------------------------------------------- HPI Details Patient Name: Date of Service: Nancy Hayes. 11/19/2022 12:30 PM Medical Record Number: [id number] Patient Account Number: [id number] Date of Birth/Sex: Treating RN: 02/14/1927 (89 y.o. 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 She is currently bedridden and wheelchair- bound. She has followed up with neurosurgery but is not a surgical candidate. Other past medical history includes essential hypertension and paroxysmal A-fib On Eliquis. She presents today with 2 ulcers located to the sacrum and left buttocks that have been present for at least a month. It is documented in the neurology note 06/03/2022 that she has had bedsores at that time. She currently  denies systemic signs of infection. 2/6; Patient presents for follow up. Shes been using dakins wet to dry dressings. She has no issues or complaints today. She tries to offload the areas but requires a lot of assistance to do so. She denies signs of infection. She resides at [facility] facility. 3/5; patient presents for follow-up. She has been using Dakin's wet-to-dry dressings to her sacral wound and left ischium wound. Unfortunately she has developed multiple wounds to her feet. These appear to be pressure related. She has a skin tear on the right lower extremity. 3/19; Patient been using Dakin's wet-to-dry dressings to her sacral and left ischium wound. She has been using Hydrofera Blue and Santyl to the feet wounds. She has been using bunny boots. Electronic Signature(s) Signed: 11/19/2022 5:03:25 PM By: Kalman Shan DO Entered By: Kalman Shan on 11/19/2022 14:36:50 -------------------------------------------------------------------------------- Physical Exam Details Patient Name: Date of Service: Nancy Hayes 11/19/2022 12:30 PM Medical Record Number: [id number] Patient Account Number: [id number] Date of Birth/Sex: Treating RN: 27-Sep-1926 (89 y.o. F) Primary Care Provider: Bing Matter Other Clinician: Referring Provider: Treating Provider/Extender: Truddie Hidden Weeks in Treatment: 10 Constitutional respirations regular, non-labored and within target range for patient.Nancy Hayes, Nancy Hayes ([id number]) [id number] Page 2 of 8 Psychiatric pleasant and cooperative. Notes Sacrum: Unhealthy tissue with bone exposed. Left buttocks: Open wound with unhealthy tissue throughout. No probing to bone here. Multiple open wounds to her feet with granulation tissue and scant nonviable tissue. Skin tear to the right lower extremity. No signs of surrounding soft tissue infection to any of the wound beds. Electronic Signature(s) Signed:  11/19/2022 5:03:25 PM By: Kalman Shan DO Entered By: Kalman Shan on 11/19/2022 15:21:29 -------------------------------------------------------------------------------- Physician Orders Details Patient Name: Date of Service: Nancy Hayes. 11/19/2022 12:30 PM Medical Record Number: [id number] Patient Account Number: [id number] Date of Birth/Sex: Treating RN: 1927/07/25 (89 y.o. Tonita [place], [place] Primary Care Provider: Bing Matter  Other Clinician: Referring Provider: Treating Provider/Extender: Sabino Dick in Treatment: 10 Verbal / Phone Orders: No Diagnosis Coding Follow-up Appointments ppointment in: - Dr .Heber [place] *NO APPOINTMENT BETWEEN 11-12* stretcher room Return A Bathing/ Shower/ Hygiene Do not shower or bathe in tub. Off-Loading Low air-loss mattress (Group 2) - facility to ensure patient patient is currently using an air mattress. Turn and reposition every 2 hours Prevalon Boot - provided patient with boots for both feet. patient needs to have on to aid in preventing more pressure ulcers and aid in healing. Non Wound Condition Protect area with: - any bony prominences with border foam. Other Non Wound Condition Orders/Instructions: - any skin tears apply xeroform and bordered foam Wound Treatment Wound #1 - Sacrum Cleanser: Wound Cleanser 1 x Per Day/30 Days Discharge Instructions: Cleanse the wound with wound cleanser prior to applying a clean dressing using gauze sponges, not tissue or cotton balls. Prim Dressing: Dakin's Solution 0.25%, 16 (oz) 1 x Per Day/30 Days ary Discharge Instructions: Moisten gauze with Dakin's solution Secondary Dressing: Zetuvit Plus Silicone Border Dressing 7x7(in/in) 1 x Per Day/30 Days Discharge Instructions: Apply silicone border over primary dressing as directed. Wound #2 - Ischium Wound Laterality: Left Cleanser: Wound Cleanser 1 x Per Day/30 Days Discharge Instructions: Cleanse the wound  with wound cleanser prior to applying a clean dressing using gauze sponges, not tissue or cotton balls. Prim Dressing: Dakin's Solution 0.25%, 16 (oz) 1 x Per Day/30 Days ary Discharge Instructions: Moisten gauze with Dakin's solution Secondary Dressing: Zetuvit Plus Silicone Border Dressing 7x7(in/in) 1 x Per Day/30 Days Discharge Instructions: Apply silicone border over primary dressing as directed. Wound #3 - Lower Leg Wound Laterality: Right, Lateral Prim Dressing: Xeroform Occlusive Gauze Dressing, 4x4 in 1 x Per Day/30 Days ary Discharge Instructions: Apply to wound bed as instructed Secondary Dressing: Zetuvit Plus Silicone Border Dressing 4x4 (in/in) 1 x Per Day/30 Days Discharge Instructions: Apply silicone border over primary dressing as directed. Wound #4 - Foot Wound Laterality: Right, Medial Cleanser: Wound Cleanser 1 x Per Day/30 Days Discharge Instructions: Cleanse the wound with wound cleanser prior to applying a clean dressing using gauze sponges, not tissue or cotton balls. Nancy Hayes, Nancy Hayes ([id number]) [id number] Page 3 of 8 Prim Dressing: Hydrofera Blue Ready Transfer Foam, 2.5x2.5 (in/in) 1 x Per Day/30 Days ary Discharge Instructions: Apply directly to wound bed as directed Prim Dressing: Santyl Ointment 1 x Per Day/30 Days ary Discharge Instructions: Apply nickel thick amount to wound bed as instructed Secondary Dressing: Zetuvit Plus Silicone Border Dressing 4x4 (in/in) 1 x Per Day/30 Days Discharge Instructions: Apply silicone border over primary dressing as directed. Wound #5 - Foot Wound Laterality: Left, Medial Cleanser: Wound Cleanser 1 x Per Day/30 Days Discharge Instructions: Cleanse the wound with wound cleanser prior to applying a clean dressing using gauze sponges, not tissue or cotton balls. Prim Dressing: Hydrofera Blue Ready Transfer Foam, 2.5x2.5 (in/in) 1 x Per Day/30 Days ary Discharge Instructions: Apply directly to  wound bed as directed Prim Dressing: Santyl Ointment 1 x Per Day/30 Days ary Discharge Instructions: Apply nickel thick amount to wound bed as instructed Secondary Dressing: Zetuvit Plus Silicone Border Dressing 4x4 (in/in) 1 x Per Day/30 Days Discharge Instructions: Apply silicone border over primary dressing as directed. Electronic Signature(s) Signed: 11/19/2022 5:03:25 PM By: Kalman Shan DO Entered By: Kalman Shan on 11/19/2022 15:21:42 -------------------------------------------------------------------------------- Problem List Details Patient Name: Date of Service: Nancy Hayes. 11/19/2022 12:30 PM Medical Record Number: [id number]  Patient Account Number: [id number] Date of Birth/Sex: Treating RN: 1927/08/28 (89 y.o. F) Primary Care Provider: Bing Matter Other Clinician: Referring Provider: Treating Provider/Extender: Truddie Hidden Weeks in Treatment: 10 Active Problems ICD-10 Encounter Code Description Active Date MDM Diagnosis L89.154 Pressure ulcer of sacral region, stage 4 09/10/2022 No Yes L89.320 Pressure ulcer of left buttock, unstageable 09/10/2022 No Yes I10 Essential (primary) hypertension 09/10/2022 No Yes Z79.01 Long term (current) use of anticoagulants 09/10/2022 No Yes G82.22 Paraplegia, incomplete 09/10/2022 No Yes M48.00 Spinal stenosis, site unspecified 09/10/2022 No Yes E44.0 Moderate protein-calorie malnutrition 09/10/2022 No Yes I48.0 Paroxysmal atrial fibrillation 09/10/2022 No Yes Nancy Hayes, Nancy Hayes ([id number]) [id number] Page 4 of 8 L89.890 Pressure ulcer of other site, unstageable 11/05/2022 No Yes S91.302A Unspecified open wound, left foot, initial encounter 11/05/2022 No Yes S91.301A Unspecified open wound, right foot, initial encounter 11/05/2022 No Yes S81.801A Unspecified open wound, right lower leg, initial encounter 11/05/2022 No Yes Inactive Problems Resolved Problems Electronic Signature(s) Signed:  11/19/2022 5:03:25 PM By: Kalman Shan DO Entered By: Kalman Shan on 11/19/2022 14:25:18 -------------------------------------------------------------------------------- Progress Note Details Patient Name: Date of Service: Nancy Hayes. 11/19/2022 12:30 PM Medical Record Number: [id number] Patient Account Number: [id number] Date of Birth/Sex: Treating RN: 12/11/1926 (89 y.o. F) Primary Care Provider: Bing Matter Other Clinician: Referring Provider: Treating Provider/Extender: Sabino Dick in Treatment: 10 Subjective Chief Complaint Information obtained from Patient 09/10/2022; sacral and left buttocks wound History of Present Illness (HPI) 09/10/2022 Ms. 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 She is currently bedridden and wheelchair- bound. She has followed up with neurosurgery but is not a surgical candidate. Other past medical history includes essential hypertension and paroxysmal A-fib On Eliquis. She presents today with 2 ulcers located to the sacrum and left buttocks that have been present for at least a month. It is documented in the neurology note 06/03/2022 that she has had bedsores at that time. She currently denies systemic signs of infection. 2/6; Patient presents for follow up. Shes been using dakins wet to dry dressings. She has no issues or complaints today. She tries to offload the areas but requires a lot of assistance to do so. She denies signs of infection. She resides at [facility] facility. 3/5; patient presents for follow-up. She has been using Dakin's wet-to-dry dressings to her sacral wound and left ischium wound. Unfortunately she has developed multiple wounds to her feet. These appear to be pressure  related. She has a skin tear on the right lower extremity. 3/19; Patient been using Dakin's wet-to-dry dressings to her sacral and left ischium wound. She has been using Hydrofera Blue and Santyl to the feet wounds. She has been using bunny boots. Patient History Information obtained from Patient, Chart. Family History Unknown History. Social History Never smoker, Marital Status - Widowed, Alcohol Use - Never, Drug Use - No History, Caffeine Use - Rarely. Medical History Cardiovascular Patient has history of Arrhythmia - A fibb, Hypertension Denies history of Congestive Heart Failure, Coronary Artery Disease, Deep Vein Thrombosis, Hypotension, Myocardial Infarction, Peripheral Arterial Disease, Peripheral Venous Disease, Phlebitis, Vasculitis Musculoskeletal Patient has history of Osteoarthritis Neurologic Nancy Hayes, Nancy Hayes ([id number]) [id number] Page 5 of 8 Patient has history of Paraplegia Denies history of Dementia, Neuropathy, Quadriplegia Hospitalization/Surgery History - EGD with propfol. - balloon dilation. -  extracorporeal shock wave lithotripsy. - cataract extraction. - partial hysterectomy but pt. states total. Medical A Surgical History Notes nd Neurologic resteless leg syndrome Objective Constitutional respirations regular, non-labored and within target range for patient.. Vitals Time Taken: 12:46 PM, Temperature: 98.7 F, Pulse: 74 bpm, Respiratory Rate: 17 breaths/min, Blood Pressure: 124/83 mmHg. Psychiatric pleasant and cooperative. General Notes: Sacrum: Unhealthy tissue with bone exposed. Left buttocks: Open wound with unhealthy tissue throughout. No probing to bone here. Multiple open wounds to her feet with granulation tissue and scant nonviable tissue. Skin tear to the right lower extremity. No signs of surrounding soft tissue infection to any of the wound beds. Integumentary (Hair, Skin) Wound #1 status is Open. Original cause of  wound was Pressure Injury. The date acquired was: 09/02/2021. The wound has been in treatment 10 weeks. The wound is located on the Sacrum. The wound measures 4cm length x 2.8cm width x 0.6cm depth; 8.796cm^2 area and 5.278cm^3 volume. There is bone and Fat Layer (Subcutaneous Tissue) exposed. There is no tunneling noted, however, there is undermining starting at 6:00 and ending at 12:00 with a maximum distance of 2.5cm. There is a large amount of serosanguineous drainage noted. The wound margin is well defined and not attached to the wound base. There is large (67-100%) red, pink granulation within the wound bed. There is a small (1-33%) amount of necrotic tissue within the wound bed including Adherent Slough. The periwound skin appearance exhibited: Scarring. The periwound skin appearance did not exhibit: Callus, Crepitus, Excoriation, Induration, Rash, Dry/Scaly, Maceration, Atrophie Blanche, Cyanosis, Ecchymosis, Hemosiderin Staining, Mottled, Pallor, Rubor, Erythema. Periwound temperature was noted as No Abnormality. The periwound has tenderness on palpation. Wound #2 status is Open. Original cause of wound was Pressure Injury. The date acquired was: 09/02/2021. The wound has been in treatment 10 weeks. The wound is located on the Left Ischium. The wound measures 1.6cm length x 2.7cm width x 0.9cm depth; 3.393cm^2 area and 3.054cm^3 volume. There is muscle and Fat Layer (Subcutaneous Tissue) exposed. There is undermining starting at 11:00 and ending at 12:00 with a maximum distance of 2.5cm. There is a medium amount of serosanguineous drainage noted. The wound margin is distinct with the outline attached to the wound base. There is large (67-100%) hyper - granulation within the wound bed. There is a small (1-33%) amount of necrotic tissue within the wound bed including Adherent Slough. The periwound skin appearance exhibited: Scarring. The periwound skin appearance did not exhibit: Callus, Crepitus,  Excoriation, Induration, Rash, Dry/Scaly, Maceration, Atrophie Blanche, Cyanosis, Ecchymosis, Hemosiderin Staining, Mottled, Pallor, Rubor, Erythema. Periwound temperature was noted as No Abnormality. The periwound has tenderness on palpation. Wound #3 status is Open. Original cause of wound was Trauma. The date acquired was: 11/05/2022. The wound has been in treatment 2 weeks. The wound is located on the Right,Lateral Lower Leg. The wound measures 0cm length x 0cm width x 0cm depth; 0cm^2 area and 0cm^3 volume. There is Fat Layer (Subcutaneous Tissue) exposed. There is a small amount of serosanguineous drainage noted. The wound margin is distinct with the outline attached to the wound base. There is large (67-100%) red granulation within the wound bed. There is a small (1-33%) amount of necrotic tissue within the wound bed including Adherent Slough. The periwound skin appearance did not exhibit: Callus, Crepitus, Excoriation, Induration, Rash, Scarring, Dry/Scaly, Maceration, Atrophie Blanche, Cyanosis, Ecchymosis, Hemosiderin Staining, Mottled, Pallor, Rubor, Erythema. Wound #4 status is Open. Original cause of wound was Pressure Injury. The date acquired was: 11/05/2022.  The wound has been in treatment 2 weeks. The wound is located on the Right,Medial Foot. The wound measures 0.1cm length x 0.1cm width x 0.1cm depth; 0.008cm^2 area and 0.001cm^3 volume. There is Fat Layer (Subcutaneous Tissue) exposed. There is no tunneling or undermining noted. There is a medium amount of serosanguineous drainage noted. The wound margin is distinct with the outline attached to the wound base. There is small (1-33%) red, pink granulation within the wound bed. There is a large (67- 100%) amount of necrotic tissue within the wound bed including Adherent Slough. The periwound skin appearance did not exhibit: Callus, Crepitus, Excoriation, Induration, Rash, Scarring, Dry/Scaly, Maceration, Atrophie Blanche, Cyanosis,  Ecchymosis, Hemosiderin Staining, Mottled, Pallor, Rubor, Erythema. Wound #5 status is Open. Original cause of wound was Pressure Injury. The date acquired was: 11/05/2022. The wound has been in treatment 2 weeks. The wound is located on the Left,Medial Foot. The wound measures 1.5cm length x 1.2cm width x 0.1cm depth; 1.414cm^2 area and 0.141cm^3 volume. There is a medium amount of serosanguineous drainage noted. The wound margin is distinct with the outline attached to the wound base. There is no granulation within the wound bed. There is a large (67-100%) amount of necrotic tissue within the wound bed including Eschar and Adherent Slough. The periwound skin appearance did not exhibit: Callus, Crepitus, Excoriation, Induration, Rash, Scarring, Dry/Scaly, Maceration, Atrophie Blanche, Cyanosis, Ecchymosis, Hemosiderin Staining, Mottled, Pallor, Rubor, Erythema. Assessment Active Problems ICD-10 Pressure ulcer of sacral region, stage 4 Pressure ulcer of left buttock, unstageable Essential (primary) hypertension Long term (current) use of anticoagulants Paraplegia, incomplete Spinal stenosis, site unspecified Nancy Hayes, Nancy Hayes ([id number]) [id number] Page 6 of 8 Moderate protein-calorie malnutrition Paroxysmal atrial fibrillation Pressure ulcer of other site, unstageable Unspecified open wound, left foot, initial encounter Unspecified open wound, right foot, initial encounter Unspecified open wound, right lower leg, initial encounter Patient's wounds are stable. I recommended continuing the course with Dakin's wet-to-dry dressings to the sacral and left ischial wound. T the remaining o wounds she can use Hydrofera Blue and Santyl. X-ray of the sacrum showed no osseous abnormality. There is exposed bone however. Son was present and we discussed conservative wound care versus being more aggressive with treatment plan. Since patient is contracted and bedbound son would  like to pursue conservative wound care. I agree this would be best for the patient. She will follow-up in her clinic every 2 months and son knows to call with any questions or concerns. Plan Follow-up Appointments: Return Appointment in: - Dr .Heber [place] *NO APPOINTMENT BETWEEN 11-12* stretcher room Bathing/ Shower/ Hygiene: Do not shower or bathe in tub. Off-Loading: Low air-loss mattress (Group 2) - facility to ensure patient patient is currently using an air mattress. Turn and reposition every 2 hours Prevalon Boot - provided patient with boots for both feet. patient needs to have on to aid in preventing more pressure ulcers and aid in healing. Non Wound Condition: Protect area with: - any bony prominences with border foam. Other Non Wound Condition Orders/Instructions: - any skin tears apply xeroform and bordered foam WOUND #1: - Sacrum Wound Laterality: Cleanser: Wound Cleanser 1 x Per Day/30 Days Discharge Instructions: Cleanse the wound with wound cleanser prior to applying a clean dressing using gauze sponges, not tissue or cotton balls. Prim Dressing: Dakin's Solution 0.25%, 16 (oz) 1 x Per Day/30 Days ary Discharge Instructions: Moisten gauze with Dakin's solution Secondary Dressing: Zetuvit Plus Silicone Border Dressing 7x7(in/in) 1 x Per Day/30 Days Discharge Instructions: Apply silicone border over  primary dressing as directed. WOUND #2: - Ischium Wound Laterality: Left Cleanser: Wound Cleanser 1 x Per Day/30 Days Discharge Instructions: Cleanse the wound with wound cleanser prior to applying a clean dressing using gauze sponges, not tissue or cotton balls. Prim Dressing: Dakin's Solution 0.25%, 16 (oz) 1 x Per Day/30 Days ary Discharge Instructions: Moisten gauze with Dakin's solution Secondary Dressing: Zetuvit Plus Silicone Border Dressing 7x7(in/in) 1 x Per Day/30 Days Discharge Instructions: Apply silicone border over primary dressing as directed. WOUND #3: - Lower Leg  Wound Laterality: Right, Lateral Prim Dressing: Xeroform Occlusive Gauze Dressing, 4x4 in 1 x Per Day/30 Days ary Discharge Instructions: Apply to wound bed as instructed Secondary Dressing: Zetuvit Plus Silicone Border Dressing 4x4 (in/in) 1 x Per Day/30 Days Discharge Instructions: Apply silicone border over primary dressing as directed. WOUND #4: - Foot Wound Laterality: Right, Medial Cleanser: Wound Cleanser 1 x Per Day/30 Days Discharge Instructions: Cleanse the wound with wound cleanser prior to applying a clean dressing using gauze sponges, not tissue or cotton balls. Prim Dressing: Hydrofera Blue Ready Transfer Foam, 2.5x2.5 (in/in) 1 x Per Day/30 Days ary Discharge Instructions: Apply directly to wound bed as directed Prim Dressing: Santyl Ointment 1 x Per Day/30 Days ary Discharge Instructions: Apply nickel thick amount to wound bed as instructed Secondary Dressing: Zetuvit Plus Silicone Border Dressing 4x4 (in/in) 1 x Per Day/30 Days Discharge Instructions: Apply silicone border over primary dressing as directed. WOUND #5: - Foot Wound Laterality: Left, Medial Cleanser: Wound Cleanser 1 x Per Day/30 Days Discharge Instructions: Cleanse the wound with wound cleanser prior to applying a clean dressing using gauze sponges, not tissue or cotton balls. Prim Dressing: Hydrofera Blue Ready Transfer Foam, 2.5x2.5 (in/in) 1 x Per Day/30 Days ary Discharge Instructions: Apply directly to wound bed as directed Prim Dressing: Santyl Ointment 1 x Per Day/30 Days ary Discharge Instructions: Apply nickel thick amount to wound bed as instructed Secondary Dressing: Zetuvit Plus Silicone Border Dressing 4x4 (in/in) 1 x Per Day/30 Days Discharge Instructions: Apply silicone border over primary dressing as directed. 1. Dakin's wet-to-dry dressings 2. Hydrofera Blue and Santyl 3. Aggressive offloadingoobunny wound 4. Follow-up in 2 months Electronic Signature(s) Signed: 11/19/2022 5:03:25 PM  By: Kalman Shan DO Entered By: Kalman Shan on 11/19/2022 15:23:53 HxROS Details -------------------------------------------------------------------------------- Nancy Hayes ([id number]) [id number] Page 7 of 8 Patient Name: Date of Service: Nancy Hayes 11/19/2022 12:30 PM Medical Record Number: [id number] Patient Account Number: [id number] Date of Birth/Sex: Treating RN: [birth year] (89 y.o. F) Primary Care Provider: Bing Matter Other Clinician: Referring Provider: Treating Provider/Extender: Sabino Dick in Treatment: 10 Information Obtained From Patient Chart Cardiovascular Medical History: Positive for: Arrhythmia - A fibb; Hypertension Negative for: Congestive Heart Failure; Coronary Artery Disease; Deep Vein Thrombosis; Hypotension; Myocardial Infarction; Peripheral Arterial Disease; Peripheral Venous Disease; Phlebitis; Vasculitis Musculoskeletal Medical History: Positive for: Osteoarthritis Neurologic Medical History: Positive for: Paraplegia Negative for: Dementia; Neuropathy; Quadriplegia Past Medical History Notes: resteless leg syndrome Immunizations Pneumococcal Vaccine: Received Pneumococcal Vaccination: Yes Received Pneumococcal Vaccination On or After 60th Birthday: Yes Implantable Devices None Hospitalization / Surgery History Type of Hospitalization/Surgery EGD with propfol balloon dilation extracorporeal shock wave lithotripsy cataract extraction partial hysterectomy but pt. states total Family and Social History Unknown History: Yes; Never smoker; Marital Status - Widowed; Alcohol Use: Never; Drug Use: No History; Caffeine Use: Rarely; Financial Concerns: No; Food, Clothing or Shelter Needs: No; Support System Lacking: No; Transportation Concerns: No Electronic Signature(s) Signed: 11/19/2022 5:03:25 PM  By: Kalman Shan DO Entered By: Kalman Shan on 11/19/2022  15:21:08 -------------------------------------------------------------------------------- SuperBill Details Patient Name: Date of Service: Nancy Hayes 11/19/2022 Medical Record Number: [id number] Patient Account Number: [id number] Date of Birth/Sex: Treating RN: [birth year] (89 y.o. 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, incomplete M48.00 Spinal stenosis, site unspecified E44.0 Moderate protein-calorie malnutrition I48.0 Paroxysmal atrial fibrillation L89.890 Pressure ulcer of other site, unstageable S91.302A Unspecified open wound, left foot, initial encounter S91.301A Unspecified open wound, right foot, initial encounter S81.801A Unspecified open wound, right lower leg, initial encounter Facility Procedures : CPT4 Code: [id number] Description: [id number] - WOUND CARE VISIT-LEV 5 EST PT Modifier: Quantity: 1 Physician Procedures : CPT4 Code Description Modifier [id number] - WC PHYS LEVEL 3 - EST PT ICD-10 Diagnosis Description L89.154 Pressure ulcer of sacral region, stage 4 L89.320 Pressure ulcer of left buttock, unstageable S91.302A Unspecified open wound, left foot,  initial encounter S91.301A Unspecified open wound, right foot, initial encounter Quantity: 1 Electronic Signature(s) Signed: 11/19/2022 4:27:18 PM By: Deon Pilling RN, BSN Signed: 11/19/2022 5:03:25 PM By: Kalman Shan DO Entered By: Deon Pilling on 11/19/2022 16:25:10

## 2022-11-23 NOTE — Progress Notes (Addendum)
Nancy Hayes, Nancy Hayes ([id number]) [id number] Page 1 of 12 Visit Report for 11/19/2022 Arrival Information Details Patient Name: Date of Service: Nancy Hayes 11/19/2022 12:30 PM Medical Record Number: [id number] Patient Account Number: [id number] Date of Birth/Sex: Treating RN: 07/26/1927 (89 y.o. Nancy Hayes, Nancy Hayes Primary Care Analeise Mccleery: Mady Gemma Other Clinician: Referring Kaoir Loree: Treating Calise Dunckel/Extender: Ollen Barges in Treatment: 10 Visit Information History Since Last Visit Added or deleted any medications: No Patient Arrived: Wheel Chair Any new allergies or adverse reactions: No Arrival Time: 12:40 Had a fall or experienced change in No Accompanied By: self activities of daily living that may affect Transfer Assistance: Michiel Sites Lift risk of falls: Patient Identification Verified: Yes Signs or symptoms of abuse/neglect since last visito No Secondary Verification Process Completed: Yes Hospitalized since last visit: No Patient Requires Transmission-Based Precautions: No Implantable device outside of the clinic excluding No Patient Has Alerts: No cellular tissue based products placed in the center since last visit: Has Dressing in Place as Prescribed: Yes Pain Present Now: Unable to Respond Electronic Signature(s) Signed: 11/22/2022 11:12:19 AM By: Fonnie Mu RN Entered By: Fonnie Mu on 11/19/2022 12:40:47 -------------------------------------------------------------------------------- Clinic Level of Care Assessment Details Patient Name: Date of Service: Nancy Hayes 11/19/2022 12:30 PM Medical Record Number: [id number] Patient Account Number: [id number] Date of Birth/Sex: Treating RN: September 26, 1926 (89 y.o. Debara Pickett, Yvonne Kendall Primary Care Breigh Annett: Mady Gemma Other Clinician: Referring Jenessa Gillingham: Treating Illyana Schorsch/Extender: Ollen Barges in Treatment: 10 Clinic  Level of Care Assessment Items TOOL 4 Quantity Score X- 1 0 Use when only an EandM is performed on FOLLOW-UP visit ASSESSMENTS - Nursing Assessment / Reassessment X- 1 10 Reassessment of Co-morbidities (includes updates in patient status) X- 1 5 Reassessment of Adherence to Treatment Plan ASSESSMENTS - Wound and Skin A ssessment / Reassessment []  - 0 Simple Wound Assessment / Reassessment - one wound X- 5 5 Complex Wound Assessment / Reassessment - multiple wounds X- 1 10 Dermatologic / Skin Assessment (not related to wound area) ASSESSMENTS - Focused Assessment []  - 0 Circumferential Edema Measurements - multi extremities X- 1 10 Nutritional Assessment / Counseling / Intervention []  - 0 Lower Extremity Assessment (monofilament, tuning fork, pulses) []  - 0 Peripheral Arterial Disease Assessment (using hand held doppler) ASSESSMENTS - Ostomy and/or Continence Assessment and Care []  - 0 Incontinence Assessment and Management []  - 0 Ostomy Care Assessment and Management (repouching, etc.) PROCESS - Coordination of Care []  - 0 Simple Patient / Family Education for ongoing care Nancy Hayes, Nancy Hayes ([id number]) [id number] Page 2 of 12 X- 1 20 Complex (extensive) Patient / Family Education for ongoing care X- 1 10 Staff obtains Consents, Records, T Results / Process Orders est X- 1 10 Staff telephones HHA, Nursing Homes / Clarify orders / etc []  - 0 Routine Transfer to another Facility (non-emergent condition) []  - 0 Routine Hospital Admission (non-emergent condition) []  - 0 New Admissions / Manufacturing engineer / Ordering NPWT Apligraf, etc. , []  - 0 Emergency Hospital Admission (emergent condition) []  - 0 Simple Discharge Coordination X- 1 15 Complex (extensive) Discharge Coordination PROCESS - Special Needs []  - 0 Pediatric / Minor Patient Management []  - 0 Isolation Patient Management []  - 0 Hearing / Language / Visual special  needs []  - 0 Assessment of Community assistance (transportation, D/C planning, etc.) []  - 0 Additional assistance / Altered mentation []  - 0 Support Surface(s) Assessment (bed, cushion, seat, etc.) INTERVENTIONS - Wound Cleansing / Measurement []  -  0 Simple Wound Cleansing - one wound X- 5 5 Complex Wound Cleansing - multiple wounds X- 1 5 Wound Imaging (photographs - any number of wounds) []  - 0 Wound Tracing (instead of photographs) []  - 0 Simple Wound Measurement - one wound X- 5 5 Complex Wound Measurement - multiple wounds INTERVENTIONS - Wound Dressings X - Small Wound Dressing one or multiple wounds 5 10 []  - 0 Medium Wound Dressing one or multiple wounds []  - 0 Large Wound Dressing one or multiple wounds []  - 0 Application of Medications - topical []  - 0 Application of Medications - injection INTERVENTIONS - Miscellaneous []  - 0 External ear exam []  - 0 Specimen Collection (cultures, biopsies, blood, body fluids, etc.) []  - 0 Specimen(s) / Culture(s) sent or taken to Lab for analysis []  - 0 Patient Transfer (multiple staff / Nurse, adult / Similar devices) []  - 0 Simple Staple / Suture removal (25 or less) []  - 0 Complex Staple / Suture removal (26 or more) []  - 0 Hypo / Hyperglycemic Management (close monitor of Blood Glucose) []  - 0 Ankle / Brachial Index (ABI) - do not check if billed separately X- 1 5 Vital Signs Has the patient been seen at the hospital within the last three years: Yes Total Score: 225 Level Of Care: New/Established - Level 5 Electronic Signature(s) Signed: 11/19/2022 4:27:18 PM By: Shawn Stall RN, BSN Entered By: Shawn Stall on 11/19/2022 16:25:04 Nancy Hayes ([id number]) [id number] Page 3 of 12 -------------------------------------------------------------------------------- Encounter Discharge Information Details Patient Name: Date of Service: Nancy Hayes 11/19/2022 12:30 PM Medical Record  Number: [id number] Patient Account Number: [id number] Date of Birth/Sex: Treating RN: 1926/10/12 (89 y.o. Nancy Hayes Primary Care Josey Forcier: Mady Gemma Other Clinician: Referring Auriella Wieand: Treating Katti Pelle/Extender: Ollen Barges in Treatment: 10 Encounter Discharge Information Items Discharge Condition: Stable Ambulatory Status: Wheelchair Discharge Destination: Skilled Nursing Facility Telephoned: No Orders Sent: Yes Transportation: Private Auto Accompanied By: son Schedule Follow-up Appointment: Yes Clinical Summary of Care: Electronic Signature(s) Signed: 11/19/2022 4:27:18 PM By: Shawn Stall RN, BSN Entered By: Shawn Stall on 11/19/2022 16:20:32 -------------------------------------------------------------------------------- Lower Extremity Assessment Details Patient Name: Date of Service: Nancy Hayes 11/19/2022 12:30 PM Medical Record Number: [id number] Patient Account Number: [id number] Date of Birth/Sex: Treating RN: 12/11/1926 (89 y.o. Nancy Hayes, Nancy Hayes Primary Care Imad Shostak: Mady Gemma Other Clinician: Referring Phil Michels: Treating Nafis Farnan/Extender: Gilford Silvius Weeks in Treatment: 10 Electronic Signature(s) Signed: 11/22/2022 11:12:19 AM By: Fonnie Mu RN Entered By: Fonnie Mu on 11/19/2022 12:47:20 -------------------------------------------------------------------------------- Multi Wound Chart Details Patient Name: Date of Service: Nancy Hayes. 11/19/2022 12:30 PM Medical Record Number: [id number] Patient Account Number: [id number] Date of Birth/Sex: Treating RN: 06/13/1927 (89 y.o. F) Primary Care Malyssa Maris: Mady Gemma Other Clinician: Referring Trenee Igoe: Treating Jinny Sweetland/Extender: Ollen Barges in Treatment: 10 Vital Signs Height(in): Pulse(bpm): 74 Weight(lbs): Blood Pressure(mmHg): 124/83 Body Mass Index(BMI): Temperature(F):  98.7 Respiratory Rate(breaths/min): 17 [1:Photos:] [[id number] Page 4 of 12] Sacrum Left Ischium Right, Lateral Lower Leg Wound Location: Pressure Injury Pressure Injury Trauma Wounding Event: Pressure Ulcer Pressure Ulcer Skin Tear Primary Etiology: Arrhythmia, Hypertension, Arrhythmia, Hypertension, Arrhythmia, Hypertension, Comorbid History: Osteoarthritis, Paraplegia Osteoarthritis, Paraplegia Osteoarthritis, Paraplegia 09/02/2021 09/02/2021 11/05/2022 Date Acquired: 10 10 2  Weeks of Treatment: Open Open Open Wound Status: No No No Wound Recurrence: 4x2.8x0.6 1.6x2.7x0.9 0x0x0 Measurements L x W x D (cm) 8.796 3.393 0 A (cm) : rea 5.278 3.054 0 Volume (cm) : 32.70% 93.00% 100.00% %  Reduction in A rea: 71.20% 96.90% 100.00% % Reduction in Volume: 6 11 Starting Position 1 (o'clock): 12 12 Ending Position 1 (o'clock): 2.5 2.5 Maximum Distance 1 (cm): Yes Yes N/A Undermining: Category/Stage IV Category/Stage IV Full Thickness Without Exposed Classification: Support Structures Large Medium Small Exudate A mount: Serosanguineous Serosanguineous Serosanguineous Exudate Type: red, brown red, brown red, brown Exudate Color: Well defined, not attached Distinct, outline attached Distinct, outline attached Wound Margin: Large (67-100%) Large (67-100%) Large (67-100%) Granulation Amount: Red, Pink Hyper-granulation Red Granulation Quality: Small (1-33%) Small (1-33%) Small (1-33%) Necrotic Amount: Adherent Slough Adherent [employer] Necrotic Tissue: Fat Layer (Subcutaneous Tissue): Yes Fat Layer (Subcutaneous Tissue): Yes Fat Layer (Subcutaneous Tissue): Yes Exposed Structures: Bone: Yes Muscle: Yes Fascia: No Fascia: No Fascia: No Tendon: No Tendon: No Tendon: No Muscle: No Muscle: No Joint: No Joint: No Joint: No Bone: No Bone: No None None Small (1-33%) Epithelialization: Scarring: Yes Scarring:  Yes Excoriation: No Periwound Skin Texture: Excoriation: No Excoriation: No Induration: No Induration: No Induration: No Callus: No Callus: No Callus: No Crepitus: No Crepitus: No Crepitus: No Rash: No Rash: No Rash: No Scarring: No Maceration: No Maceration: No Maceration: No Periwound Skin Moisture: Dry/Scaly: No Dry/Scaly: No Dry/Scaly: No Atrophie Blanche: No Atrophie Blanche: No Atrophie Blanche: No Periwound Skin Color: Cyanosis: No Cyanosis: No Cyanosis: No Ecchymosis: No Ecchymosis: No Ecchymosis: No Erythema: No Erythema: No Erythema: No Hemosiderin Staining: No Hemosiderin Staining: No Hemosiderin Staining: No Mottled: No Mottled: No Mottled: No Pallor: No Pallor: No Pallor: No Rubor: No Rubor: No Rubor: No No Abnormality No Abnormality N/A Temperature: Yes Yes N/A Tenderness on Palpation: Wound Number: 4 5 N/A Photos: N/A Right, Medial Foot Left, Medial Foot N/A Wound Location: Pressure Injury Pressure Injury N/A Wounding Event: Pressure Ulcer Pressure Ulcer N/A Primary Etiology: Arrhythmia, Hypertension, Arrhythmia, Hypertension, N/A Comorbid History: Osteoarthritis, Paraplegia Osteoarthritis, Paraplegia 11/05/2022 11/05/2022 N/A Date Acquired: 2 2 N/A Weeks of Treatment: Open Open N/A Wound Status: No No N/A Wound Recurrence: 0.1x0.1x0.1 1.5x1.2x0.1 N/A Measurements L x W x D (cm) 0.008 1.414 N/A A (cm) : rea 0.001 0.141 N/A Volume (cm) : 94.90% -125.20% N/A % Reduction in A rea: 93.80% -123.80% N/A % Reduction in Volume: No N/A N/A Undermining: Unstageable/Unclassified Unstageable/Unclassified N/A Classification: Medium Medium N/A Exudate A mount: Serosanguineous Serosanguineous N/A Exudate Type: red, brown red, brown N/A Exudate Color: Distinct, outline attached Distinct, outline attached N/A Wound MarginMARIFER, Nancy Hayes ([id number]) [id number] Page 5 of 12 Small (1-33%) None  Present (0%) N/A Granulation Amount: Red, Pink N/A N/A Granulation Quality: Large (67-100%) Large (67-100%) N/A Necrotic Amount: Adherent Slough Eschar, Adherent Slough N/A Necrotic Tissue: Fat Layer (Subcutaneous Tissue): Yes Fascia: No N/A Exposed Structures: Fascia: No Fat Layer (Subcutaneous Tissue): No Tendon: No Tendon: No Muscle: No Muscle: No Joint: No Joint: No Bone: No Bone: No Small (1-33%) Small (1-33%) N/A Epithelialization: Excoriation: No Excoriation: No N/A Periwound Skin Texture: Induration: No Induration: No Callus: No Callus: No Crepitus: No Crepitus: No Rash: No Rash: No Scarring: No Scarring: No Maceration: No Maceration: No N/A Periwound Skin Moisture: Dry/Scaly: No Dry/Scaly: No Atrophie Blanche: No Atrophie Blanche: No N/A Periwound Skin Color: Cyanosis: No Cyanosis: No Ecchymosis: No Ecchymosis: No Erythema: No Erythema: No Hemosiderin Staining: No Hemosiderin Staining: No Mottled: No Mottled: No Pallor: No Pallor: No Rubor: No Rubor: No N/A N/A N/A Temperature: Treatment Notes Electronic Signature(s) Signed: 11/19/2022 5:03:25 PM By: Geralyn Corwin DO Entered By: Geralyn Corwin on 11/19/2022 14:25:24 -------------------------------------------------------------------------------- Multi-Disciplinary Care  Plan Details Patient Name: Date of Service: Nancy Hayes 11/19/2022 12:30 PM Medical Record Number: [id number] Patient Account Number: [id number] Date of Birth/Sex: Treating RN: 11-Oct-1926 (89 y.o. Nancy Hayes Primary Care Reyann Troop: Mady Gemma Other Clinician: Referring Jurnei Latini: Treating Kensy Blizard/Extender: Gilford Silvius Weeks in Treatment: 10 Active Inactive Electronic Signature(s) Signed: 12/24/2022 8:57:23 AM By: Shawn Stall RN, BSN Previous Signature: 11/19/2022 4:27:18 PM Version By: Shawn Stall RN, BSN Entered By: Shawn Stall on 12/24/2022  08:57:22 -------------------------------------------------------------------------------- Pain Assessment Details Patient Name: Date of Service: Nancy Hayes. 11/19/2022 12:30 PM Medical Record Number: [id number] Patient Account Number: [id number] Date of Birth/Sex: Treating RN: Jul 18, 1927 (89 y.o. Nancy Hayes, Nancy Hayes Primary Care Lenell Mcconnell: Mady Gemma Other Clinician: Referring Presli Fanguy: Treating Joyceline Maiorino/Extender: Gilford Silvius Weeks in Treatment: 10 Active Problems Location of Pain Severity and Description of Pain Patient Has Paino Patient Unable to [address] EVANNY, ELLERBE ([id number]) [id number] Page 6 of 12 Pain Management and Medication Current Pain Management: Electronic Signature(s) Signed: 11/22/2022 11:12:19 AM By: Fonnie Mu RN Entered By: Fonnie Mu on 11/19/2022 12:40:54 -------------------------------------------------------------------------------- Patient/Caregiver Education Details Patient Name: Date of Service: Nancy Hayes 3/19/2024andnbsp12:30 PM Medical Record Number: [id number] Patient Account Number: [id number] Date of Birth/Gender: Treating RN: 05/13/1927 (89 y.o. Nancy Hayes Primary Care Physician: Mady Gemma Other Clinician: Referring Physician: Treating Physician/Extender: Ollen Barges in Treatment: 10 Education Assessment Education Provided To: Patient Education Topics Provided Wound/Skin Impairment: Handouts: Caring for Your Ulcer Methods: Explain/Verbal Responses: Reinforcements needed Electronic Signature(s) Signed: 11/19/2022 4:27:18 PM By: Shawn Stall RN, BSN Entered By: Shawn Stall on 11/19/2022 16:18:51 -------------------------------------------------------------------------------- Wound Assessment Details Patient Name: Date of Service: Nancy Hayes. 11/19/2022 12:30 PM Medical Record Number:  [id number] Patient Account Number: [id number] Date of Birth/Sex: Treating RN: 1926/11/10 (89 y.o. Nancy Hayes, Nancy Hayes Primary Care Promise Bushong: Mady Gemma Other Clinician: Referring Elhadji Pecore: Treating Liddie Chichester/Extender: Gilford Silvius Weeks in Treatment: 10 Wound Status Wound Number: 1 Primary Etiology: Pressure Ulcer Wound Location: Sacrum Wound Status: Open Wounding Event: Pressure Injury Comorbid History: Arrhythmia, Hypertension, Osteoarthritis, Paraplegia Date Acquired: 09/02/2021 Weeks Of Treatment: 10 Nancy Hayes, Nancy Hayes ([id number]) [phone].pdf Page 7 of 12 Clustered Wound: No Photos Wound Measurements Length: (cm) 4 Width: (cm) 2.8 Depth: (cm) 0.6 Area: (cm) 8.796 Volume: (cm) 5.278 % Reduction in Area: 32.7% % Reduction in Volume: 71.2% Epithelialization: None Tunneling: No Undermining: Yes Starting Position (o'clock): 6 Ending Position (o'clock): 12 Maximum Distance: (cm) 2.5 Wound Description Classification: Category/Stage IV Wound Margin: Well defined, not attached Exudate Amount: Large Exudate Type: Serosanguineous Exudate Color: red, brown Foul Odor After Cleansing: No Slough/Fibrino Yes Wound Bed Granulation Amount: Large (67-100%) Exposed Structure Granulation Quality: Red, Pink Fascia Exposed: No Necrotic Amount: Small (1-33%) Fat Layer (Subcutaneous Tissue) Exposed: Yes Necrotic Quality: Adherent Slough Tendon Exposed: No Muscle Exposed: No Joint Exposed: No Bone Exposed: Yes Periwound Skin Texture Texture Color No Abnormalities Noted: No No Abnormalities Noted: No Callus: No Atrophie Blanche: No Crepitus: No Cyanosis: No Excoriation: No Ecchymosis: No Induration: No Erythema: No Rash: No Hemosiderin Staining: No Scarring: Yes Mottled: No Pallor: No Moisture Rubor: No No Abnormalities Noted: No Dry / Scaly: No Temperature / Pain Maceration: No Temperature: No Abnormality Tenderness  on Palpation: Yes Electronic Signature(s) Signed: 11/19/2022 5:06:05 PM By: Samuella Bruin Signed: 11/22/2022 11:12:19 AM By: Fonnie Mu RN Entered By: Samuella Bruin on 11/19/2022 12:54:36 -------------------------------------------------------------------------------- Wound Assessment Details Patient Name: Date of Service: Nancy Hayes,  Nancy XY G. 11/19/2022 12:30 PM Medical Record Number: [id number] Patient Account Number: [id number] Date of Birth/Sex: Treating RN: [birth year] (89 y.o. Nancy Hayes, Nancy Hayes Primary Care Keni Elison: Mady Gemma Other Clinician: Referring Reena Borromeo: Treating Betzabeth Derringer/Extender: Amylia, Collazos ([id number]) [id number] Page 8 of 12 Weeks in Treatment: 10 Wound Status Wound Number: 2 Primary Etiology: Pressure Ulcer Wound Location: Left Ischium Wound Status: Open Wounding Event: Pressure Injury Comorbid History: Arrhythmia, Hypertension, Osteoarthritis, Paraplegia Date Acquired: 09/02/2021 Weeks Of Treatment: 10 Clustered Wound: No Photos Wound Measurements Length: (cm) 1.6 Width: (cm) 2.7 Depth: (cm) 0.9 Area: (cm) 3.393 Volume: (cm) 3.054 % Reduction in Area: 93% % Reduction in Volume: 96.9% Epithelialization: None Undermining: Yes Starting Position (o'clock): 11 Ending Position (o'clock): 12 Maximum Distance: (cm) 2.5 Wound Description Classification: Category/Stage IV Wound Margin: Distinct, outline attached Exudate Amount: Medium Exudate Type: Serosanguineous Exudate Color: red, brown Foul Odor After Cleansing: No Slough/Fibrino Yes Wound Bed Granulation Amount: Large (67-100%) Exposed Structure Granulation Quality: Hyper-granulation Fascia Exposed: No Necrotic Amount: Small (1-33%) Fat Layer (Subcutaneous Tissue) Exposed: Yes Necrotic Quality: Adherent Slough Tendon Exposed: No Muscle Exposed: Yes Necrosis of Muscle: No Joint Exposed: No Bone Exposed:  No Periwound Skin Texture Texture Color No Abnormalities Noted: No No Abnormalities Noted: No Callus: No Atrophie Blanche: No Crepitus: No Cyanosis: No Excoriation: No Ecchymosis: No Induration: No Erythema: No Rash: No Hemosiderin Staining: No Scarring: Yes Mottled: No Pallor: No Moisture Rubor: No No Abnormalities Noted: No Dry / Scaly: No Temperature / Pain Maceration: No Temperature: No Abnormality Tenderness on Palpation: Yes Electronic Signature(s) Signed: 11/19/2022 5:06:05 PM By: Samuella Bruin Signed: 11/22/2022 11:12:19 AM By: Fonnie Mu RN Entered By: Samuella Bruin on 11/19/2022 12:55:02 Nancy Hayes ([id number]) [id number] Page 9 of 12 -------------------------------------------------------------------------------- Wound Assessment Details Patient Name: Date of Service: Nancy Hayes 11/19/2022 12:30 PM Medical Record Number: [id number] Patient Account Number: [id number] Date of Birth/Sex: Treating RN: February 03, 1927 (89 y.o. Nancy Hayes, Nancy Hayes Primary Care Santez Woodcox: Mady Gemma Other Clinician: Referring Santasia Rew: Treating Crystian Frith/Extender: Gilford Silvius Weeks in Treatment: 10 Wound Status Wound Number: 3 Primary Etiology: Skin Tear Wound Location: Right, Lateral Lower Leg Wound Status: Open Wounding Event: Trauma Comorbid History: Arrhythmia, Hypertension, Osteoarthritis, Paraplegia Date Acquired: 11/05/2022 Weeks Of Treatment: 2 Clustered Wound: No Photos Wound Measurements Length: (cm) Width: (cm) Depth: (cm) Area: (cm) Volume: (cm) 0 % Reduction in Area: 100% 0 % Reduction in Volume: 100% 0 Epithelialization: Small (1-33%) 0 0 Wound Description Classification: Full Thickness Without Exposed Suppor Wound Margin: Distinct, outline attached Exudate Amount: Small Exudate Type: Serosanguineous Exudate Color: red, brown t Structures Foul Odor After Cleansing:  No Slough/Fibrino Yes Wound Bed Granulation Amount: Large (67-100%) Exposed Structure Granulation Quality: Red Fascia Exposed: No Necrotic Amount: Small (1-33%) Fat Layer (Subcutaneous Tissue) Exposed: Yes Necrotic Quality: Adherent Slough Tendon Exposed: No Muscle Exposed: No Joint Exposed: No Bone Exposed: No Periwound Skin Texture Texture Color No Abnormalities Noted: No No Abnormalities Noted: No Callus: No Atrophie Blanche: No Crepitus: No Cyanosis: No Excoriation: No Ecchymosis: No Induration: No Erythema: No Rash: No Hemosiderin Staining: No Scarring: No Mottled: No Pallor: No Moisture Rubor: No No Abnormalities Noted: No Dry / Scaly: No Maceration: No Electronic Signature(s) Signed: 11/19/2022 5:06:05 PM By: Samuella Bruin Signed: 11/22/2022 11:12:19 AM By: Fonnie Mu RN Nancy Hayes ([id number]) [id number] Page 10 of 12 Entered By: Samuella Bruin on 11/19/2022 12:44:27 -------------------------------------------------------------------------------- Wound Assessment Details Patient Name: Date of Service: Nancy Hayes. 11/19/2022  12:30 PM Medical Record Number: [id number] Patient Account Number: [id number] Date of Birth/Sex: Treating RN: [birth year] (89 y.o. Nancy Hayes, Nancy Hayes Primary Care Zakara Parkey: Mady Gemma Other Clinician: Referring Sherran Margolis: Treating Donterrius Santucci/Extender: Gilford Silvius Weeks in Treatment: 10 Wound Status Wound Number: 4 Primary Etiology: Pressure Ulcer Wound Location: Right, Medial Foot Wound Status: Open Wounding Event: Pressure Injury Comorbid History: Arrhythmia, Hypertension, Osteoarthritis, Paraplegia Date Acquired: 11/05/2022 Weeks Of Treatment: 2 Clustered Wound: No Photos Wound Measurements Length: (cm) 0.1 Width: (cm) 0.1 Depth: (cm) 0.1 Area: (cm) 0.008 Volume: (cm) 0.001 % Reduction in Area: 94.9% % Reduction in Volume:  93.8% Epithelialization: Small (1-33%) Tunneling: No Undermining: No Wound Description Classification: Unstageable/Unclassified Wound Margin: Distinct, outline attached Exudate Amount: Medium Exudate Type: Serosanguineous Exudate Color: red, brown Foul Odor After Cleansing: No Slough/Fibrino Yes Wound Bed Granulation Amount: Small (1-33%) Exposed Structure Granulation Quality: Red, Pink Fascia Exposed: No Necrotic Amount: Large (67-100%) Fat Layer (Subcutaneous Tissue) Exposed: Yes Necrotic Quality: Adherent Slough Tendon Exposed: No Muscle Exposed: No Joint Exposed: No Bone Exposed: No Periwound Skin Texture Texture Color No Abnormalities Noted: No No Abnormalities Noted: No Callus: No Atrophie Blanche: No Crepitus: No Cyanosis: No Excoriation: No Ecchymosis: No Induration: No Erythema: No Rash: No Hemosiderin Staining: No Scarring: No Mottled: No Pallor: No Moisture Rubor: No No Abnormalities Noted: No Dry / Scaly: No Maceration: No Nancy Hayes, Nancy Hayes ([id number]) [id number] Page 11 of 12 Electronic Signature(s) Signed: 11/19/2022 5:06:05 PM By: Samuella Bruin Signed: 11/22/2022 11:12:19 AM By: Fonnie Mu RN Entered By: Samuella Bruin on 11/19/2022 12:45:12 -------------------------------------------------------------------------------- Wound Assessment Details Patient Name: Date of Service: Nancy Hayes. 11/19/2022 12:30 PM Medical Record Number: [id number] Patient Account Number: [id number] Date of Birth/Sex: Treating RN: [birth year] (89 y.o. Nancy Hayes, Nancy Hayes Primary Care Cong Hightower: Mady Gemma Other Clinician: Referring Lonn Im: Treating Mirakle Tomlin/Extender: Gilford Silvius Weeks in Treatment: 10 Wound Status Wound Number: 5 Primary Etiology: Pressure Ulcer Wound Location: Left, Medial Foot Wound Status: Open Wounding Event: Pressure Injury Comorbid History: Arrhythmia, Hypertension,  Osteoarthritis, Paraplegia Date Acquired: 11/05/2022 Weeks Of Treatment: 2 Clustered Wound: No Photos Wound Measurements Length: (cm) 1.5 Width: (cm) 1.2 Depth: (cm) 0.1 Area: (cm) 1.414 Volume: (cm) 0.141 % Reduction in Area: -125.2% % Reduction in Volume: -123.8% Epithelialization: Small (1-33%) Wound Description Classification: Unstageable/Unclassified Wound Margin: Distinct, outline attached Exudate Amount: Medium Exudate Type: Serosanguineous Exudate Color: red, brown Foul Odor After Cleansing: No Slough/Fibrino Yes Wound Bed Granulation Amount: None Present (0%) Exposed Structure Necrotic Amount: Large (67-100%) Fascia Exposed: No Necrotic Quality: Eschar, Adherent Slough Fat Layer (Subcutaneous Tissue) Exposed: No Tendon Exposed: No Muscle Exposed: No Joint Exposed: No Bone Exposed: No Periwound Skin Texture Texture Color No Abnormalities Noted: No No Abnormalities Noted: No Callus: No Atrophie Blanche: No Crepitus: No Cyanosis: No Excoriation: No Ecchymosis: No Induration: No Erythema: No Rash: No Hemosiderin Staining: No Scarring: No Mottled: No Pallor: No Moisture Rubor: No No Abnormalities Noted: No Nancy Hayes, Nancy Hayes ([id number]) [id number].pdf Page 12 of 12 Dry / Scaly: No Maceration: No Electronic Signature(s) Signed: 11/19/2022 5:06:05 PM By: Samuella Bruin Signed: 11/22/2022 11:12:19 AM By: Fonnie Mu RN Entered By: Samuella Bruin on 11/19/2022 12:46:23 -------------------------------------------------------------------------------- Vitals Details Patient Name: Date of Service: Nancy Hayes. 11/19/2022 12:30 PM Medical Record Number: [id number] Patient Account Number: [id number] Date of Birth/Sex: Treating RN: 20-May-1927 (89 y.o. Nancy Hayes, Nancy Hayes Primary Care Verland Sprinkle: Mady Gemma Other Clinician: Referring Panda Crossin: Treating Jamaria Amborn/Extender: Gilford Silvius Weeks in  Treatment: 10 Vital Signs  Time Taken: 12:46 Temperature (F): 98.7 Pulse (bpm): 74 Respiratory Rate (breaths/min): 17 Blood Pressure (mmHg): 124/83 Reference Range: 80 - 120 mg / dl Electronic Signature(s) Signed: 11/22/2022 11:12:19 AM By: Fonnie Mu RN Entered By: Fonnie Mu on 11/19/2022 12:46:57

## 2022-12-02 DEATH — deceased

## 2022-12-30 ENCOUNTER — Ambulatory Visit: Payer: Medicare Other | Admitting: Cardiovascular Disease

## 2023-01-20 ENCOUNTER — Ambulatory Visit (HOSPITAL_BASED_OUTPATIENT_CLINIC_OR_DEPARTMENT_OTHER): Payer: Medicare Other | Admitting: Internal Medicine
